# Patient Record
Sex: Female | Born: 1955 | Race: White | Hispanic: No | Marital: Single | Smoking: Never smoker
Health system: Southern US, Community
[De-identification: ages and names within clinical notes are randomized; demographics above are authoritative.]

## PROBLEM LIST (undated history)

## (undated) DIAGNOSIS — Z87442 Personal history of urinary calculi: Secondary | ICD-10-CM

## (undated) DIAGNOSIS — I10 Essential (primary) hypertension: Secondary | ICD-10-CM

## (undated) DIAGNOSIS — G459 Transient cerebral ischemic attack, unspecified: Secondary | ICD-10-CM

## (undated) DIAGNOSIS — G629 Polyneuropathy, unspecified: Secondary | ICD-10-CM

## (undated) DIAGNOSIS — IMO0002 Reserved for concepts with insufficient information to code with codable children: Secondary | ICD-10-CM

## (undated) DIAGNOSIS — Z85528 Personal history of other malignant neoplasm of kidney: Secondary | ICD-10-CM

## (undated) DIAGNOSIS — E785 Hyperlipidemia, unspecified: Secondary | ICD-10-CM

## (undated) DIAGNOSIS — Z973 Presence of spectacles and contact lenses: Secondary | ICD-10-CM

## (undated) DIAGNOSIS — D649 Anemia, unspecified: Secondary | ICD-10-CM

## (undated) DIAGNOSIS — Z8619 Personal history of other infectious and parasitic diseases: Secondary | ICD-10-CM

## (undated) DIAGNOSIS — I639 Cerebral infarction, unspecified: Secondary | ICD-10-CM

## (undated) DIAGNOSIS — Z8673 Personal history of transient ischemic attack (TIA), and cerebral infarction without residual deficits: Secondary | ICD-10-CM

## (undated) DIAGNOSIS — K760 Fatty (change of) liver, not elsewhere classified: Secondary | ICD-10-CM

## (undated) DIAGNOSIS — Z8739 Personal history of other diseases of the musculoskeletal system and connective tissue: Secondary | ICD-10-CM

## (undated) DIAGNOSIS — C649 Malignant neoplasm of unspecified kidney, except renal pelvis: Secondary | ICD-10-CM

## (undated) DIAGNOSIS — N2889 Other specified disorders of kidney and ureter: Secondary | ICD-10-CM

## (undated) DIAGNOSIS — N200 Calculus of kidney: Secondary | ICD-10-CM

## (undated) DIAGNOSIS — Q600 Renal agenesis, unilateral: Secondary | ICD-10-CM

## (undated) HISTORY — PX: TUBAL LIGATION: SHX77

---
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
## 1998-06-18 ENCOUNTER — Emergency Department (HOSPITAL_COMMUNITY): Admission: EM | Admit: 1998-06-18 | Discharge: 1998-06-18 | Payer: Self-pay | Admitting: Emergency Medicine

## 2007-10-09 ENCOUNTER — Ambulatory Visit: Payer: Self-pay | Admitting: *Deleted

## 2007-10-09 ENCOUNTER — Encounter (INDEPENDENT_AMBULATORY_CARE_PROVIDER_SITE_OTHER): Payer: Self-pay | Admitting: Otolaryngology

## 2007-10-09 ENCOUNTER — Inpatient Hospital Stay (HOSPITAL_COMMUNITY): Admission: EM | Admit: 2007-10-09 | Discharge: 2007-10-13 | Payer: Self-pay | Admitting: Emergency Medicine

## 2008-04-16 ENCOUNTER — Inpatient Hospital Stay (HOSPITAL_COMMUNITY): Admission: EM | Admit: 2008-04-16 | Discharge: 2008-04-18 | Payer: Self-pay | Admitting: Emergency Medicine

## 2008-04-17 ENCOUNTER — Encounter (INDEPENDENT_AMBULATORY_CARE_PROVIDER_SITE_OTHER): Payer: Self-pay | Admitting: Internal Medicine

## 2008-04-18 ENCOUNTER — Encounter (INDEPENDENT_AMBULATORY_CARE_PROVIDER_SITE_OTHER): Payer: Self-pay | Admitting: *Deleted

## 2008-04-18 ENCOUNTER — Ambulatory Visit: Payer: Self-pay | Admitting: Vascular Surgery
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
## 2011-03-18 NOTE — Discharge Summary (Signed)
[name], [name]               ACCOUNT NO.:  [id number]   MEDICAL RECORD NO.:  [id number]          PATIENT TYPE:  INP   LOCATION:  41                         FACILITY:  [facility]   PHYSICIAN:  Mobolaji B. Bakare, M.D.DATE OF BIRTH:  [birth year]   DATE OF ADMISSION:  04/16/2008  DATE OF DISCHARGE:  04/18/2008                               DISCHARGE SUMMARY   PRIMARY CARE PHYSICIAN:  Unassigned.   FINAL DIAGNOSES:  1. Transient ischemic attack.  2. Hypertension.  3. Hyperlipidemia.  4. Hyperhomocysteinemia.  5. Renal insufficiency, resolved with intravenous fluids.  6. Obesity.   PROCEDURES:  1. Head CT scan was negative for acute abnormality.  Chest x-ray      showed no acute cardiopulmonary disease.  MRI of the head showed no      acute infarct.  2. MRA of head showed mild intracranial atherosclerotic disease.  3. Carotid Dopplers showed no internal carotid artery stenosis.  A 2-D      echocardiogram showed normal left ventricular systolic function,      ejection fraction estimated to be 65%, no cardiac thrombus      identified.   BRIEF HISTORY:  Please refer to the admission H&P.  In brief, Ms.  Foore had a brief episode of numbness involving her left upper  extremity and left lower extremity without any associated weakness,  which lasted about 30 minutes prior to arrival in the emergency room.  Symptoms had resolved completely at the emergency room.  She did not  qualify for any thrombolytic.  She is a nonsmoker.  The patient does not  have a regular doctor.  She was noted on admission to be hypertensive  with significantly elevated blood pressure.  She was admitted to  telemetry and monitored.   HOSPITAL COURSE:  TIA.  She had comprehensive TIA workup including 2-D  echocardiogram, carotid Dopplers, MRI of the head and MRA of the neck.  There was no acute infarct identified on MRI.  There was no cardiac  thrombus.  Carotid Dopplers were normal.  Homocystine level  was  elevated.  She had hyperlipidemia with LDL of 159.  The patient was  started on Zocor 40 mg daily and also Foltx.  Blood pressure was  controlled with hydrochlorothiazide, Lopressor, and lisinopril.  At the  time of discharge, blood pressure was 142/77.  She will need to follow  up with a PCP in the outpatient setting.   She had renal insufficiency on admission with BUN and creatinine of  24/1.6 respectively.  She did well on IV fluid.  At the time of  discharge, BUN was 99 and creatinine of 1.18.   DISCHARGE MEDICATIONS:  1. Aspirin 325 mg p.o. daily.  2. Lopressor 25 mg b.i.d.  3. Lisinopril 10 mg daily.  4. Zocor 40 mg daily.  5. Foltx 1 tablet daily.   Follow up with Dr. Barbette Merino in 1 week.   DIET:  Low-sodium diet, low-cholesterol diet.      Mobolaji B. Maia Petties, M.D.  Electronically Signed     MBB/MEDQ  D:  04/18/2008  T:  04/19/2008  Job:  [id number]   cc:   Barbette Merino, M.D.
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
## 2011-07-31 LAB — CARDIAC PANEL(CRET KIN+CKTOT+MB+TROPI)
CK, MB: 1.2
Total CK: 61
Troponin I: 0.01

## 2011-07-31 LAB — URINE CULTURE: Colony Count: >=100000

## 2011-07-31 LAB — BASIC METABOLIC PANEL
BUN: 19
Calcium: 9.2
Creatinine, Ser: 1.18
GFR calc Af Amer: 58 — ABNORMAL LOW
GFR calc non Af Amer: 48 — ABNORMAL LOW

## 2011-07-31 LAB — URINALYSIS, ROUTINE W REFLEX MICROSCOPIC
Bilirubin Urine: NEGATIVE
Hgb urine dipstick: NEGATIVE
Ketones, ur: NEGATIVE
Protein, ur: NEGATIVE
Urobilinogen, UA: 0.2

## 2011-07-31 LAB — POCT I-STAT, CHEM 8
Calcium, Ion: 1.22
Chloride: 106
Creatinine, Ser: 1.6 — ABNORMAL HIGH
Glucose, Bld: 113 — ABNORMAL HIGH
HCT: 39
Hemoglobin: 13.3
Sodium: 141

## 2011-07-31 LAB — HEPATIC FUNCTION PANEL
Albumin: 3.4 — ABNORMAL LOW
Indirect Bilirubin: 0.5
Total Protein: 7

## 2011-07-31 LAB — POCT CARDIAC MARKERS: CKMB, poc: 1.3

## 2011-07-31 LAB — CBC
HCT: 37
Hemoglobin: 12.4
Platelets: 290
RBC: 4.75
RDW: 18.4 — ABNORMAL HIGH
WBC: 8.4

## 2011-07-31 LAB — DIFFERENTIAL
Basophils Absolute: 0
Eosinophils Absolute: 0.1
Eosinophils Relative: 2
Lymphocytes Relative: 27
Lymphs Abs: 2.3
Monocytes Absolute: 0.5
Monocytes Relative: 6
Neutro Abs: 5.4
Neutrophils Relative %: 64

## 2011-07-31 LAB — LIPID PANEL
Cholesterol: 249 — ABNORMAL HIGH
LDL Cholesterol: 159 — ABNORMAL HIGH
Triglycerides: 200 — ABNORMAL HIGH
VLDL: 40

## 2011-07-31 LAB — URINE MICROSCOPIC-ADD ON

## 2011-07-31 LAB — TSH: TSH: 4.816

## 2011-08-11 LAB — CBC
HCT: 23.9 — ABNORMAL LOW
HCT: 26.2 — ABNORMAL LOW
HCT: 36.1
Hemoglobin: 11.5 — ABNORMAL LOW
Hemoglobin: 7.9 — CL
Hemoglobin: 8.6 — ABNORMAL LOW
MCHC: 34.1
MCV: 100.1 — ABNORMAL HIGH
MCV: 74.5 — ABNORMAL LOW
MCV: 74.6 — ABNORMAL LOW
Platelets: 175
Platelets: 322
RBC: 3.36 — ABNORMAL LOW
RBC: 3.54 — ABNORMAL LOW
RBC: 4.84
RDW: 14.9
RDW: 18.8 — ABNORMAL HIGH
RDW: 19 — ABNORMAL HIGH
WBC: 10.2
WBC: 7.1

## 2011-08-11 LAB — I-STAT 8, (EC8 V) (CONVERTED LAB)
BUN: 23
Bicarbonate: 23.3
Glucose, Bld: 100 — ABNORMAL HIGH
Hemoglobin: 13.6
TCO2: 25
pH, Ven: 7.352 — ABNORMAL HIGH

## 2011-08-11 LAB — POCT I-STAT 3, ART BLOOD GAS (G3+)
Acid-Base Excess: 6 — ABNORMAL HIGH
Bicarbonate: 30.6 — ABNORMAL HIGH
O2 Saturation: 93
Operator id: 205501
Patient temperature: 99.1
Patient temperature: 99.5
TCO2: 24
pCO2 arterial: 37.2
pH, Arterial: 7.402 — ABNORMAL HIGH
pO2, Arterial: 66 — ABNORMAL LOW

## 2011-08-11 LAB — BASIC METABOLIC PANEL
BUN: 8
CO2: 27
Calcium: 7.4 — ABNORMAL LOW
Chloride: 109
GFR calc Af Amer: 55 — ABNORMAL LOW
GFR calc non Af Amer: 46 — ABNORMAL LOW
GFR calc non Af Amer: 53 — ABNORMAL LOW
Glucose, Bld: 109 — ABNORMAL HIGH
Glucose, Bld: 114 — ABNORMAL HIGH
Potassium: 3.9
Potassium: 4.2
Sodium: 137
Sodium: 139

## 2011-08-11 LAB — SAMPLE TO BLOOD BANK

## 2011-08-11 LAB — DIFFERENTIAL
Basophils Absolute: 0
Eosinophils Absolute: 0.1 — ABNORMAL LOW
Eosinophils Relative: 3
Lymphocytes Relative: 23
Monocytes Absolute: 0.4

## 2011-08-11 LAB — PROTIME-INR: Prothrombin Time: 12.6

## 2011-08-11 LAB — CROSSMATCH: Antibody Screen: NEGATIVE

## 2011-08-11 LAB — POCT I-STAT CREATININE: Creatinine, Ser: 1.4 — ABNORMAL HIGH

## 2015-05-14 ENCOUNTER — Ambulatory Visit (HOSPITAL_COMMUNITY)
Admission: RE | Admit: 2015-05-14 | Discharge: 2015-05-14 | Disposition: A | Discharge status: Home / Self Care | Payer: 59 | Source: Ambulatory Visit | Attending: Urology | Admitting: Urology

## 2015-05-14 ENCOUNTER — Other Ambulatory Visit: Payer: Self-pay | Admitting: Urology

## 2015-05-14 DIAGNOSIS — I517 Cardiomegaly: Secondary | ICD-10-CM | POA: Insufficient documentation

## 2015-05-14 DIAGNOSIS — N2889 Other specified disorders of kidney and ureter: Secondary | ICD-10-CM

## 2015-05-14 DIAGNOSIS — M47894 Other spondylosis, thoracic region: Secondary | ICD-10-CM | POA: Diagnosis not present

## 2015-05-28 ENCOUNTER — Other Ambulatory Visit: Payer: Self-pay | Admitting: Urology

## 2015-06-06 ENCOUNTER — Encounter (HOSPITAL_COMMUNITY)
Admission: RE | Admit: 2015-06-06 | Discharge: 2015-06-06 | Disposition: A | Discharge status: Home / Self Care | Payer: 59 | Source: Ambulatory Visit | Attending: Urology | Admitting: Urology

## 2015-06-06 ENCOUNTER — Encounter (HOSPITAL_COMMUNITY): Payer: Self-pay

## 2015-06-06 DIAGNOSIS — Z6837 Body mass index (BMI) 37.0-37.9, adult: Secondary | ICD-10-CM | POA: Diagnosis not present

## 2015-06-06 DIAGNOSIS — Z79899 Other long term (current) drug therapy: Secondary | ICD-10-CM | POA: Diagnosis not present

## 2015-06-06 DIAGNOSIS — E669 Obesity, unspecified: Secondary | ICD-10-CM | POA: Diagnosis not present

## 2015-06-06 DIAGNOSIS — Z7982 Long term (current) use of aspirin: Secondary | ICD-10-CM | POA: Diagnosis not present

## 2015-06-06 DIAGNOSIS — Z87442 Personal history of urinary calculi: Secondary | ICD-10-CM | POA: Diagnosis not present

## 2015-06-06 DIAGNOSIS — I129 Hypertensive chronic kidney disease with stage 1 through stage 4 chronic kidney disease, or unspecified chronic kidney disease: Secondary | ICD-10-CM | POA: Diagnosis not present

## 2015-06-06 DIAGNOSIS — N202 Calculus of kidney with calculus of ureter: Secondary | ICD-10-CM | POA: Diagnosis not present

## 2015-06-06 DIAGNOSIS — Z8673 Personal history of transient ischemic attack (TIA), and cerebral infarction without residual deficits: Secondary | ICD-10-CM | POA: Diagnosis not present

## 2015-06-06 DIAGNOSIS — N183 Chronic kidney disease, stage 3 (moderate): Secondary | ICD-10-CM | POA: Diagnosis not present

## 2015-06-06 DIAGNOSIS — N200 Calculus of kidney: Secondary | ICD-10-CM | POA: Diagnosis present

## 2015-06-06 DIAGNOSIS — E785 Hyperlipidemia, unspecified: Secondary | ICD-10-CM | POA: Diagnosis not present

## 2015-06-06 HISTORY — DX: Essential (primary) hypertension: I10

## 2015-06-06 HISTORY — DX: Cerebral infarction, unspecified: I63.9

## 2015-06-06 HISTORY — DX: Calculus of kidney: N20.0

## 2015-06-06 LAB — CBC
HCT: 38.9 % (ref 36.0–46.0)
HEMOGLOBIN: 13 g/dL (ref 12.0–15.0)
MCH: 30.9 pg (ref 26.0–34.0)
MCHC: 33.4 g/dL (ref 30.0–36.0)
MCV: 92.4 fL (ref 78.0–100.0)
PLATELETS: 218 10*3/uL (ref 150–400)
RBC: 4.21 MIL/uL (ref 3.87–5.11)
RDW: 14.8 % (ref 11.5–15.5)
WBC: 6.8 10*3/uL (ref 4.0–10.5)

## 2015-06-06 LAB — BASIC METABOLIC PANEL
ANION GAP: 6 (ref 5–15)
BUN: 25 mg/dL — ABNORMAL HIGH (ref 6–20)
CO2: 24 mmol/L (ref 22–32)
CREATININE: 1.81 mg/dL — AB (ref 0.44–1.00)
Calcium: 9.4 mg/dL (ref 8.9–10.3)
Chloride: 105 mmol/L (ref 101–111)
GFR calc Af Amer: 34 mL/min — ABNORMAL LOW (ref >60)
GFR calc non Af Amer: 29 mL/min — ABNORMAL LOW (ref >60)
GLUCOSE: 89 mg/dL (ref 65–99)
Potassium: 5 mmol/L (ref 3.5–5.1)
Sodium: 135 mmol/L (ref 135–145)

## 2015-06-06 NOTE — Patient Instructions (Addendum)
[name]  06/06/2015   Your procedure is scheduled on: Friday June 08, 2015  Report to [facility] Main  Entrance take [place]  elevators to 3rd floor to  [place] at 5:00 AM.  Call this number if you have problems the morning of surgery [phone]   Remember: ONLY 1 PERSON MAY GO WITH YOU TO SHORT STAY TO GET  READY MORNING OF [place].  Do not eat food or drink liquids :After Midnight.     Take these medicines the morning of surgery with A SIP OF WATER: Metoprolol (Lopressor)                               You may not have any metal on your body including hair pins and              piercings  Do not wear jewelry, make-up, lotions, powders or perfumes, deodorant             Do not wear nail polish.  Do not shave  48 hours prior to surgery.              Do not bring valuables to the hospital. [place].  Contacts, dentures or bridgework may not be worn into surgery.      Patients discharged the day of surgery will not be allowed to drive home.  Name and phone number of your driver:Roger Romilda Joy (son) [phone]  _____________________________________________________________________             [facility] - Preparing for Surgery Before surgery, you can play an important role.  Because skin is not sterile, your skin needs to be as free of germs as possible.  You can reduce the number of germs on your skin by washing with CHG (chlorahexidine gluconate) soap before surgery.  CHG is an antiseptic cleaner which kills germs and bonds with the skin to continue killing germs even after washing. Please DO NOT use if you have an allergy to CHG or antibacterial soaps.  If your skin becomes reddened/irritated stop using the CHG and inform your nurse when you arrive at Short Stay. Do not shave (including legs and underarms) for at least 48 hours prior to the first CHG shower.  You may shave  your face/neck. Please follow these instructions carefully:  1.  Shower with CHG Soap the night before surgery and the  morning of Surgery.  2.  If you choose to wash your hair, wash your hair first as usual with your  normal  shampoo.  3.  After you shampoo, rinse your hair and body thoroughly to remove the  shampoo.                           4.  Use CHG as you would any other liquid soap.  You can apply chg directly  to the skin and wash                       Gently with a scrungie or clean washcloth.  5.  Apply the CHG Soap to your body ONLY FROM THE NECK DOWN.  Do not use on face/ open                           Wound or open sores. Avoid contact with eyes, ears mouth and genitals (private parts).                       Wash face,  Genitals (private parts) with your normal soap.             6.  Wash thoroughly, paying special attention to the area where your surgery  will be performed.  7.  Thoroughly rinse your body with warm water from the neck down.  8.  DO NOT shower/wash with your normal soap after using and rinsing off  the CHG Soap.                9.  Pat yourself dry with a clean towel.            10.  Wear clean pajamas.            11.  Place clean sheets on your bed the night of your first shower and do not  sleep with pets. Day of Surgery : Do not apply any lotions/deodorants the morning of surgery.  Please wear clean clothes to the hospital/surgery center.  FAILURE TO FOLLOW THESE INSTRUCTIONS MAY RESULT IN THE CANCELLATION OF YOUR SURGERY PATIENT SIGNATURE_________________________________  NURSE SIGNATURE__________________________________  ________________________________________________________________________

## 2015-06-06 NOTE — Progress Notes (Signed)
ECHO in epic 04/17/2008  CXR epic 05/14/2015

## 2015-06-06 NOTE — Progress Notes (Signed)
Your patient has screened at an elevated risk for Obstructive Sleep Apnea using the Stop-Bang Tool during a pre-surgical vist. A score of 4 or greater is an elevated risk. Score of 4. 

## 2015-06-06 NOTE — Progress Notes (Signed)
BMP results in epic per PAT visit 06/06/2015 sent to Dr Hubbard Robinson Manny/[facility].

## 2015-06-08 ENCOUNTER — Encounter (HOSPITAL_COMMUNITY): Payer: Self-pay | Admitting: Certified Registered Nurse Anesthetist

## 2015-06-08 ENCOUNTER — Ambulatory Visit (HOSPITAL_COMMUNITY): Payer: 59 | Admitting: Anesthesiology

## 2015-06-08 ENCOUNTER — Ambulatory Visit (HOSPITAL_COMMUNITY)
Admission: RE | Admit: 2015-06-08 | Discharge: 2015-06-08 | Disposition: A | Discharge status: Home / Self Care | Payer: 59 | Source: Ambulatory Visit | Attending: Urology | Admitting: Urology

## 2015-06-08 ENCOUNTER — Encounter (HOSPITAL_COMMUNITY)
Admission: RE | Disposition: A | Discharge status: Home / Self Care | Payer: Self-pay | Source: Ambulatory Visit | Attending: Urology

## 2015-06-08 ENCOUNTER — Ambulatory Visit (HOSPITAL_COMMUNITY): Payer: 59

## 2015-06-08 DIAGNOSIS — N202 Calculus of kidney with calculus of ureter: Secondary | ICD-10-CM | POA: Insufficient documentation

## 2015-06-08 DIAGNOSIS — R52 Pain, unspecified: Secondary | ICD-10-CM

## 2015-06-08 DIAGNOSIS — Z8673 Personal history of transient ischemic attack (TIA), and cerebral infarction without residual deficits: Secondary | ICD-10-CM | POA: Insufficient documentation

## 2015-06-08 DIAGNOSIS — N183 Chronic kidney disease, stage 3 (moderate): Secondary | ICD-10-CM | POA: Insufficient documentation

## 2015-06-08 DIAGNOSIS — E785 Hyperlipidemia, unspecified: Secondary | ICD-10-CM | POA: Insufficient documentation

## 2015-06-08 DIAGNOSIS — E669 Obesity, unspecified: Secondary | ICD-10-CM | POA: Insufficient documentation

## 2015-06-08 DIAGNOSIS — Z7982 Long term (current) use of aspirin: Secondary | ICD-10-CM | POA: Insufficient documentation

## 2015-06-08 DIAGNOSIS — I129 Hypertensive chronic kidney disease with stage 1 through stage 4 chronic kidney disease, or unspecified chronic kidney disease: Secondary | ICD-10-CM | POA: Insufficient documentation

## 2015-06-08 DIAGNOSIS — Z6837 Body mass index (BMI) 37.0-37.9, adult: Secondary | ICD-10-CM | POA: Insufficient documentation

## 2015-06-08 DIAGNOSIS — Z79899 Other long term (current) drug therapy: Secondary | ICD-10-CM | POA: Insufficient documentation

## 2015-06-08 DIAGNOSIS — Z87442 Personal history of urinary calculi: Secondary | ICD-10-CM | POA: Insufficient documentation

## 2015-06-08 HISTORY — PX: CYSTOSCOPY WITH RETROGRADE PYELOGRAM, URETEROSCOPY AND STENT PLACEMENT: SHX5789

## 2015-06-08 HISTORY — PX: HOLMIUM LASER APPLICATION: SHX5852

## 2015-06-08 SURGERY — CYSTOURETEROSCOPY, WITH RETROGRADE PYELOGRAM AND STENT INSERTION
Anesthesia: General | Site: Ureter | Laterality: Right

## 2015-06-08 MED ORDER — FENTANYL CITRATE (PF) 100 MCG/2ML IJ SOLN
25.0000 ug | INTRAMUSCULAR | Status: DC | PRN
  Administered 2015-06-08: 50 ug via INTRAVENOUS

## 2015-06-08 MED ORDER — MEPERIDINE HCL 50 MG/ML IJ SOLN: 6.2500 mg | INTRAMUSCULAR | Status: DC | PRN

## 2015-06-08 MED ORDER — SODIUM CHLORIDE 0.9 % IR SOLN
Status: DC | PRN
  Administered 2015-06-08: 6000 mL

## 2015-06-08 MED ORDER — ONDANSETRON HCL 4 MG/2ML IJ SOLN
INTRAMUSCULAR | Status: AC
  Filled 2015-06-08: qty 2

## 2015-06-08 MED ORDER — OXYCODONE-ACETAMINOPHEN 5-325 MG PO TABS
1.0000 | ORAL_TABLET | Freq: Once | ORAL | Status: AC
  Administered 2015-06-08: 1 via ORAL
  Filled 2015-06-08: qty 1

## 2015-06-08 MED ORDER — LACTATED RINGERS IV SOLN: INTRAVENOUS | Status: DC

## 2015-06-08 MED ORDER — MIDAZOLAM HCL 5 MG/5ML IJ SOLN
INTRAMUSCULAR | Status: DC | PRN
  Administered 2015-06-08 (×2): 1 mg via INTRAVENOUS

## 2015-06-08 MED ORDER — FENTANYL CITRATE (PF) 100 MCG/2ML IJ SOLN
INTRAMUSCULAR | Status: DC | PRN
  Administered 2015-06-08 (×3): 12.5 ug via INTRAVENOUS
  Administered 2015-06-08: 25 ug via INTRAVENOUS
  Administered 2015-06-08: 12.5 ug via INTRAVENOUS
  Administered 2015-06-08: 25 ug via INTRAVENOUS

## 2015-06-08 MED ORDER — LACTATED RINGERS IV SOLN
INTRAVENOUS | Status: DC | PRN
  Administered 2015-06-08 (×2): via INTRAVENOUS

## 2015-06-08 MED ORDER — ONDANSETRON HCL 4 MG/2ML IJ SOLN
INTRAMUSCULAR | Status: DC | PRN
  Administered 2015-06-08: 4 mg via INTRAVENOUS

## 2015-06-08 MED ORDER — PROPOFOL 10 MG/ML IV BOLUS
INTRAVENOUS | Status: DC | PRN
  Administered 2015-06-08: 150 mg via INTRAVENOUS

## 2015-06-08 MED ORDER — OXYCODONE-ACETAMINOPHEN 5-325 MG PO TABS: 1.0000 | ORAL_TABLET | ORAL | Status: DC | PRN

## 2015-06-08 MED ORDER — FENTANYL CITRATE (PF) 100 MCG/2ML IJ SOLN
INTRAMUSCULAR | Status: AC
  Filled 2015-06-08: qty 2

## 2015-06-08 MED ORDER — IOHEXOL 300 MG/ML  SOLN
INTRAMUSCULAR | Status: DC | PRN
  Administered 2015-06-08: 14 mL via INTRAVENOUS

## 2015-06-08 MED ORDER — FENTANYL CITRATE (PF) 100 MCG/2ML IJ SOLN
INTRAMUSCULAR | Status: AC
  Filled 2015-06-08: qty 4

## 2015-06-08 MED ORDER — EPHEDRINE SULFATE 50 MG/ML IJ SOLN
INTRAMUSCULAR | Status: AC
  Filled 2015-06-08: qty 1

## 2015-06-08 MED ORDER — SENNOSIDES-DOCUSATE SODIUM 8.6-50 MG PO TABS: 1.0000 | ORAL_TABLET | Freq: Two times a day (BID) | ORAL | Status: DC

## 2015-06-08 MED ORDER — MIDAZOLAM HCL 2 MG/2ML IJ SOLN
INTRAMUSCULAR | Status: AC
  Filled 2015-06-08: qty 4

## 2015-06-08 MED ORDER — DEXTROSE 5 % IV SOLN
2.0000 g | INTRAVENOUS | Status: AC
  Administered 2015-06-08: 2 g via INTRAVENOUS

## 2015-06-08 MED ORDER — PROMETHAZINE HCL 25 MG/ML IJ SOLN
6.2500 mg | INTRAMUSCULAR | Status: DC | PRN
Start: 2015-06-08 — End: 2015-06-08

## 2015-06-08 MED ORDER — PROPOFOL 10 MG/ML IV BOLUS
INTRAVENOUS | Status: AC
  Filled 2015-06-08: qty 20

## 2015-06-08 MED ORDER — EPHEDRINE SULFATE 50 MG/ML IJ SOLN
INTRAMUSCULAR | Status: DC | PRN
  Administered 2015-06-08 (×2): 5 mg via INTRAVENOUS

## 2015-06-08 MED ORDER — DEXTROSE 5 % IV SOLN
INTRAVENOUS | Status: AC
  Filled 2015-06-08: qty 2

## 2015-06-08 SURGICAL SUPPLY — 26 items
BASKET LASER NITINOL 1.9FR (BASKET) ×2 IMPLANT
BASKET STNLS GEMINI 4WIRE 3FR (BASKET) IMPLANT
BASKET ZERO TIP NITINOL 2.4FR (BASKET) IMPLANT
BSKT STON RTRVL 120 1.9FR (BASKET) ×2
BSKT STON RTRVL GEM 120X11 3FR (BASKET)
BSKT STON RTRVL ZERO TP 2.4FR (BASKET)
CATH INTERMIT  6FR 70CM (CATHETERS) ×4 IMPLANT
CLOTH BEACON ORANGE TIMEOUT ST (SAFETY) ×4 IMPLANT
ELECT REM PT RETURN 9FT ADLT (ELECTROSURGICAL)
ELECTRODE REM PT RTRN 9FT ADLT (ELECTROSURGICAL) IMPLANT
FIBER LASER FLEXIVA 1000 (UROLOGICAL SUPPLIES) IMPLANT
FIBER LASER FLEXIVA 200 (UROLOGICAL SUPPLIES) ×2 IMPLANT
FIBER LASER FLEXIVA 365 (UROLOGICAL SUPPLIES) IMPLANT
FIBER LASER FLEXIVA 550 (UROLOGICAL SUPPLIES) IMPLANT
FIBER LASER TRAC TIP (UROLOGICAL SUPPLIES) ×2 IMPLANT
GLOVE BIOGEL M STRL SZ7.5 (GLOVE) ×4 IMPLANT
GOWN STRL REUS W/TWL XL LVL3 (GOWN DISPOSABLE) ×8 IMPLANT
GUIDEWIRE ANG ZIPWIRE 038X150 (WIRE) ×8 IMPLANT
GUIDEWIRE STR DUAL SENSOR (WIRE) ×4 IMPLANT
IV NS IRRIG 3000ML ARTHROMATIC (IV SOLUTION) ×4 IMPLANT
PACK CYSTO (CUSTOM PROCEDURE TRAY) ×4 IMPLANT
SHEATH ACCESS URETERAL 24CM (SHEATH) ×2 IMPLANT
STENT POLARIS 5FRX22 (STENTS) ×4 IMPLANT
SYRINGE 10CC LL (SYRINGE) IMPLANT
SYRINGE IRR TOOMEY STRL 70CC (SYRINGE) IMPLANT
TUBE FEEDING 8FR 16IN STR KANG (MISCELLANEOUS) ×4 IMPLANT

## 2015-06-08 NOTE — Transfer of Care (Signed)
Immediate Anesthesia Transfer of Care Note  Patient: [name]  Procedure(s) Performed: Procedure(s): CYSTOSCOPY WITH RETROGRADE PYELOGRAM, URETEROSCOPY, STONE EXTRACTION AND STENT PLACEMENT ON THE RIGHT, DIAGNOSTIC URETEROSCOPY AND STENT PLACEMENT ON THE LEFT (Bilateral) HOLMIUM LASER APPLICATION (Right)  Patient Location: PACU  Anesthesia Type:General  Level of Consciousness: Patient easily awoken, sedated, comfortable, cooperative, following commands, responds to stimulation.   Airway & Oxygen Therapy: Patient spontaneously breathing, ventilating well, oxygen via simple oxygen mask.  Post-op Assessment: Report given to PACU RN, vital signs reviewed and stable, moving all extremities.   Post vital signs: Reviewed and stable.  Complications: No apparent anesthesia complications

## 2015-06-08 NOTE — Discharge Instructions (Signed)
1 - You may have urinary urgency (bladder spasms) and bloody urine on / off with stent in place. This is normal. ° °2 - Call MD or go to ER for fever >102, severe pain / nausea / vomiting not relieved by medications, or acute change in medical status ° °

## 2015-06-08 NOTE — Anesthesia Preprocedure Evaluation (Addendum)
Anesthesia Evaluation  Patient identified by MRN, date of birth, ID band Patient awake    Reviewed: Allergy & Precautions, NPO status , Patient's Chart, lab work & pertinent test results  Airway Mallampati: II  TM Distance: >3 FB Neck ROM: Full    Dental no notable dental hx. (+) Missing   Pulmonary neg pulmonary ROS,  breath sounds clear to auscultation  Pulmonary exam normal       Cardiovascular hypertension, Pt. on medications and Pt. on home beta blockers Normal cardiovascular examRhythm:Regular Rate:Normal     Neuro/Psych TIAnegative psych ROS   GI/Hepatic negative GI ROS, Neg liver ROS,   Endo/Other  negative endocrine ROS  Renal/GU negative Renal ROS  negative genitourinary   Musculoskeletal negative musculoskeletal ROS (+)   Abdominal   Peds negative pediatric ROS (+)  Hematology negative hematology ROS (+)   Anesthesia Other Findings   Reproductive/Obstetrics negative OB ROS                            Anesthesia Physical Anesthesia Plan  ASA: III  Anesthesia Plan: General   Post-op Pain Management:    Induction: Intravenous  Airway Management Planned: LMA  Additional Equipment:   Intra-op Plan:   Post-operative Plan: Extubation in OR  Informed Consent: I have reviewed the patients History and Physical, chart, labs and discussed the procedure including the risks, benefits and alternatives for the proposed anesthesia with the patient or authorized representative who has indicated his/her understanding and acceptance.   Dental advisory given  Plan Discussed with: CRNA  Anesthesia Plan Comments:         Anesthesia Quick Evaluation

## 2015-06-08 NOTE — Op Note (Signed)
NAMERUPAL, WETHERELL               ACCOUNT NO.:  [id number]  MEDICAL RECORD NO.:  [id number]  LOCATION:  [facility]                         FACILITY:  [facility]  PHYSICIAN:  Alexis Frock, MD     DATE OF BIRTH:  Dec 02, 1955  DATE OF PROCEDURE:  06/08/2015 DATE OF DISCHARGE:  06/08/2015                              OPERATIVE REPORT   DIAGNOSES: 1. Right renal and ureteral stones. 2. Questionable left renal stones. 3. Atrophic left kidney. 4. Renal insufficiency.  PROCEDURE: 1. Cystoscopy with bilateral pyelogram interpretation. 2. Right ureteroscopy with laser lithotripsy, insertion of right     ureteral stent. 3. Left retrograde pyelogram interpretation. 4. Left diagnostic ureteroscopy. 5. Insertion of left ureteral stent.  ESTIMATED BLOOD LOSS:  Nil.  COMPLICATIONS:  None.  SPECIMEN:  Right renal and ureteral stones for compositional analysis.  FINDINGS: 1. A large right distal ureteral stone with mild hydroureteronephrosis     above this. 2. Multifocal right renal stone x2. 3. No evidence of intraluminal left-sided renal stones. 4. Placement of bilateral 5 x 22 Polaris stents, no tether.  INDICATION:  [name] is a 59 year old lady who was found to have multifocal nephrolithiasis, a large left renal mass, an atrophic left kidney and stones, included a right distal ureteral stone, multifocal right renal stones and some very small left-sided renal stones.  Her left renal mass is quite concerning for primary malignancy as it is localized and she will be undergoing left partial versus radical nephrectomy in the near future in order to obtain stone-free status of her future solitary kidney.  It was felt that ureteroscopy was warranted to render her right side stone free as well as verify intrarenal anatomy on the left to help guide toward therapy.  Informed consent was obtained and placed in the medical record.  PROCEDURE IN DETAIL:  The patient being [name]  verified. Procedure being cysto, bilateral ureteroscopy and stent placement was confirmed.  Procedure was carried out.  Time-out was performed. Intravenous antibiotics were administered.  General LMA anesthesia was introduced.  The patient was placed into a low lithotomy position. Sterile field was created by prepping and draping the patient's vagina, introitus, and proximal thighs using iodine x3.  Next, cystourethroscopy was performed using a 22-French rigid cystoscope with 30-degree offset lens.  Inspection of the bladder revealed no diverticula, calcifications or papillary lesions.  The right ureteral orifice was cannulated with 6- French end-hole catheter and right retrograde was obtained.  Right retrograde pyelogram demonstrated a single right ureter with single system right kidney.  There was a large filling defect in the distal ureter consistent with known stone.  There was mild hydroureteronephrosis above this.  A 0.038 ZIPwire was advanced at the level of upper pole and set aside as a safety wire.  An 8-French feeding tube placed in the urinary bladder for pressure release.  Next, semi- rigid ureteroscopy was performed from the distal right ureter alongside a separate Sensor working wire.  As expected, there was a large ovoid distal ureteral stone that was encountered.  This appeared to be much too large for simple basketing.  As such, holmium laser energy was applied to the stone using settings of  0.2 joules and 30 Hz, fragmenting the stone into approximately 6 smaller pieces.  These were then grasped on their long axis with an Escape basket and brought out and set aside for compositional analysis.  A semi-rigid ureteroscopy of the remainder of length of the right ureter revealed no additional calcifications.  No mucosal abnormalities.  As the goal was to render the right side completely stone free, the semi-rigid ureteroscope was exchanged over a Sensor working wire for a  12/14, 24 cm ureteral access sheath at the level of proximal ureter using fluoroscopic guidance.  Next, flexible digital ureteroscopy was performed on the right using a dual channel flexible ureteroscope.  Inspection of all calices x2 revealed 2 foci of stone.  As expected, these appeared to be amenable to basketing.  They were grasped with an Escape basket and removed in their entirety, set aside for compositional analysis.  All these maneuvers revealed the resolution of all stone fragments, larger than one-third millimeter on the right.  The access sheath was removed under continuous ureteroscopic vision.  No mucosal abnormalities were found.  Attention was then directed at the left side.  The left ureter was inspected throughout its length with a semi-rigid ureteroscope over a ZIP safety wire.  No mucosal abnormalities were found.  A separate Sensor working wire was placed on the left side and the access sheath was placed using fluoroscopic guidance to the level of the proximal ureter.  Next, flexible digital ureteroscopy was performed on the left side. Inspection of each calix x2 revealed no intraluminal stones whatsoever. It was felt that prior calcifications were only papillary tip on the left.  The access sheath was removed under continuous vision.  No mucosal abnormalities were found.  Given the patient's recent renal insufficiency and obstruction of her more dominant right kidney, it was felt that interval stenting was warranted.  As such, new 5 x 22 Polaris type stents were placed bilaterally using cystoscopic and fluoroscopic guidance.  Good deployment was noted, proximal in renal pelvis and distal in urinary bladder.  Bladder was emptied per cystoscope. Procedure was then terminated.  The patient tolerated the procedure well.  There were no immediate periprocedural complications.  The patient was taken to the postanesthesia care unit in stable condition.           ______________________________ Alexis Frock, MD     TM/MEDQ  D:  06/08/2015  T:  06/08/2015  Job:  [id number]

## 2015-06-08 NOTE — Anesthesia Postprocedure Evaluation (Signed)
  Anesthesia Post-op Note  Patient: [name]  Procedure(s) Performed: Procedure(s) (LRB): CYSTOSCOPY WITH RETROGRADE PYELOGRAM, URETEROSCOPY, STONE EXTRACTION AND STENT PLACEMENT ON THE RIGHT, DIAGNOSTIC URETEROSCOPY AND STENT PLACEMENT ON THE LEFT (Bilateral) HOLMIUM LASER APPLICATION (Right)  Patient Location: PACU  Anesthesia Type: General  Level of Consciousness: awake and alert   Airway and Oxygen Therapy: Patient Spontanous Breathing  Post-op Pain: mild  Post-op Assessment: Post-op Vital signs reviewed, Patient's Cardiovascular Status Stable, Respiratory Function Stable, Patent Airway and No signs of Nausea or vomiting  Last Vitals:  Filed Vitals:   06/08/15 1026  BP: 134/70  Pulse: 58  Temp:   Resp: 16    Post-op Vital Signs: stable   Complications: No apparent anesthesia complications

## 2015-06-08 NOTE — H&P (Signed)
[name] is an 59 y.o. female.    Chief Complaint: Pre-op Bilateral Ureteroscopic Stone Manipulation  HPI:     1 - Left Renal Mass - 5cm left upper pole mass by non-con CT 2016. Trauma CT 2008 with 2cm enhancing mass in similar location worrisome for RCC at that point. Contrast CT 05/2015 confirms approx 5cm left upper renal mass, about 60% endophytic and abuts hilar fat / 1 artery / 1 vein renovascular anatomy, CXR w/o chest masses.  2 - Nephrolithiasis - Remote history of stone passed medially years ago. CT 05/2015 with large rt distal ureteral stone (71m), Rt renal stone (490mx2) w/o hydro and punctate left lower pole stones.   3 - Stage 3 Renal Insufficiency - Cr 1.47 / GFR 40 by serum labs 05/2015. Rt>Lt renal atrophy by axial imaging.   PMH sig for HTN, HLD, Obesity. NO CV disease. NO chest / abd surgeries. Her PCP is [name] with [facility] " [name] is seen to proceed with bilateral ureteroscopic stone manipulation with goal of decompresison of her right (funcitonally dominant and likley future solitary) kidney and achieve stone free status prior to mass for left renal mass. No interval fevers. Most recent UCX with non-clonal non-specific growth for which she has been on Bactrim to decrease colony counts.   Past Medical History  Diagnosis Date  . Hypertension   . Stroke     pt states had "mini stroke" in 2010  . Numbness and tingling     hands bilat comes and goes   . Kidney stones     bilat   . Urinary tract infection     hx of   . Urinary frequency   . Measles     hx of in childhood     Past Surgical History  Procedure Laterality Date  . Tubal ligation    . Cesarean section      times 2    No family history on file. Social History:  reports that she has never smoked. She has never used smokeless tobacco. She reports that she does not drink alcohol or use illicit drugs.  Allergies: No Known Allergies  Medications Prior to Admission   Medication Sig Dispense Refill  . aspirin EC 81 MG tablet Take 81 mg by mouth daily.    . Cholecalciferol (VITAMIN D3) 5000 UNITS CAPS Take 5,000 Units by mouth daily.    . Marland Kitchenosartan (COZAAR) 50 MG tablet Take 50 mg by mouth every morning.     . lovastatin (MEVACOR) 20 MG tablet Take 20 mg by mouth every morning.     . metoprolol tartrate (LOPRESSOR) 25 MG tablet Take 25 mg by mouth 2 (two) times daily.      Results for orders placed or performed during the hospital encounter of 06/06/15 (from the past 48 hour(s))  CBC     Status: None   Collection Time: 06/06/15  9:05 AM  Result Value Ref Range   WBC 6.8 4.0 - 10.5 K/uL   RBC 4.21 3.87 - 5.11 MIL/uL   Hemoglobin 13.0 12.0 - 15.0 g/dL   HCT 38.9 36.0 - 46.0 %   MCV 92.4 78.0 - 100.0 fL   MCH 30.9 26.0 - 34.0 pg   MCHC 33.4 30.0 - 36.0 g/dL   RDW 14.8 11.5 - 15.5 %   Platelets 218 150 - 400 K/uL  Basic metabolic panel     Status: Abnormal   Collection Time: 06/06/15  9:05 AM  Result Value Ref Range   Sodium 135 135 - 145 mmol/L   Potassium 5.0 3.5 - 5.1 mmol/L   Chloride 105 101 - 111 mmol/L   CO2 24 22 - 32 mmol/L   Glucose, Bld 89 65 - 99 mg/dL   BUN 25 (H) 6 - 20 mg/dL   Creatinine, Ser 1.81 (H) 0.44 - 1.00 mg/dL   Calcium 9.4 8.9 - 10.3 mg/dL   GFR calc non Af Amer 29 (L) >60 mL/min   GFR calc Af Amer 34 (L) >60 mL/min    Comment: (NOTE) The eGFR has been calculated using the CKD EPI equation. This calculation has not been validated in all clinical situations. eGFR's persistently <60 mL/min signify possible Chronic Kidney Disease.    Anion gap 6 5 - 15   No results found.  Review of Systems  Constitutional: Negative.   HENT: Negative.   Eyes: Negative.   Respiratory: Negative.   Cardiovascular: Negative.   Gastrointestinal: Negative.   Genitourinary: Negative.   Musculoskeletal: Negative.   Skin: Negative.   Neurological: Negative.   Endo/Heme/Allergies: Negative.   Psychiatric/Behavioral: Negative.      Blood pressure 171/93, pulse 55, temperature 97.4 F (36.3 C), temperature source Oral, resp. rate 18, height _0  (1.549 m), weight 90.436 kg (199 lb 6 oz), SpO2 98 %. Physical Exam  Constitutional: She appears well-developed.  HENT:  Head: Normocephalic.  Eyes: Pupils are equal, round, and reactive to light.  Neck: Normal range of motion.  Cardiovascular: Normal rate.   Respiratory: Effort normal.  GI: Soft.  Genitourinary:  No CVAT at present  Musculoskeletal: Normal range of motion.  Neurological: She is alert.  Skin: Skin is warm.  Psychiatric: She has a normal mood and affect. Her behavior is normal. Judgment and thought content normal.     Assessment/Plan          1 - Left Renal Mass - certianly worrisome for renal cell carcinoma. Relatively large / deep mass in lady with some significant renal compromise and nephrolithiais. Rec Rt ureteroscopy / laser to get rid of all right sided stones (as may become solitary Rt kidney) next avail then likely left robotic partial v. radical nephrectomy.   2 - Nephrolithiasis - Significant rt ureteral and rt renal stones and small left sided renal stones. Rec goal of stone free before tumor surgery to maximize renal function prior and lessen chances of blockaged of future solitary kidney with bilateral ureteroscopy.  We rediscussed ureteroscopic stone manipulation with basketing and laser-lithotripsy in detail.  We rediscussed risks including bleeding, infection, damage to kidney / ureter  bladder, rarely loss of kidney. We rediscussed anesthetic risks and rare but serious surgical complications including DVT, PE, MI, and mortality. We specifically readdressed that in 5-10% of cases a staged approach is required with stenting followed by re-attempt ureteroscopy if anatomy unfavorable.   The patient voiced understanding and wishes to proceed today as planned.   3 - Stage 3 Renal Insufficiency - likely combination some stone obstruciton  and medical renal disease. She has very real risk of progression to ESRD in likght of poor baseline funciton and need for left nephron removal from cancer.    [name] 06/08/2015, 6:16 AM

## 2015-06-08 NOTE — Addendum Note (Signed)
Addendum  created 06/08/15 1431 by Deliah Boston, CRNA   Modules edited: Anesthesia Attestations

## 2015-06-08 NOTE — Brief Op Note (Signed)
06/08/2015  8:54 AM  PATIENT:  [name]  59 y.o. female  PRE-OPERATIVE DIAGNOSIS:  RIGHT GREATER THAN LEFT KIDNEY STONES  POST-OPERATIVE DIAGNOSIS:  RIGHT GREATER THAN LEFT KIDNEY STONES  PROCEDURE:  Procedure(s): CYSTOSCOPY WITH RETROGRADE PYELOGRAM, URETEROSCOPY AND STENT PLACEMENT (Bilateral) HOLMIUM LASER APPLICATION (Bilateral)  SURGEON:  Surgeon(s) and Role:    * Alexis Frock, MD - Primary  PHYSICIAN ASSISTANT:   ASSISTANTS: none   ANESTHESIA:   general  EBL:  Total I/O In: 1000 [I.V.:1000] Out: -   BLOOD ADMINISTERED:none  DRAINS: none   LOCAL MEDICATIONS USED:  NONE  SPECIMEN:  Source of Specimen:  Rt renal and ureteral stones  DISPOSITION OF SPECIMEN:  [facility] for compositional analysis  COUNTS:  YES  TOURNIQUET:  * No tourniquets in log *  DICTATION: .Other Dictation: Dictation Number [id number]  PLAN OF CARE: Discharge to home after PACU  PATIENT DISPOSITION:  PACU - hemodynamically stable.   Delay start of Pharmacological VTE agent (>24hrs) due to surgical blood loss or risk of bleeding: yes

## 2015-06-08 NOTE — Anesthesia Procedure Notes (Signed)
Procedure Name: LMA Insertion Date/Time: 06/08/2015 7:35 AM Performed by: Deliah Boston Pre-anesthesia Checklist: Patient identified, Emergency Drugs available, Suction available and Patient being monitored Patient Re-evaluated:Patient Re-evaluated prior to inductionOxygen Delivery Method: Circle system utilized Preoxygenation: Pre-oxygenation with 100% oxygen Intubation Type: IV induction Ventilation: Mask ventilation without difficulty LMA: LMA inserted LMA Size: 4.0 Number of attempts: 1 Placement Confirmation: positive ETCO2 and breath sounds checked- equal and bilateral Tube secured with: Tape Dental Injury: Teeth and Oropharynx as per pre-operative assessment

## 2015-06-11 ENCOUNTER — Encounter (HOSPITAL_COMMUNITY): Payer: Self-pay | Admitting: Urology

## 2015-06-21 ENCOUNTER — Other Ambulatory Visit: Payer: Self-pay | Admitting: Urology

## 2015-07-13 NOTE — Patient Instructions (Addendum)
YOUR PROCEDURE IS SCHEDULED ON :  07/18/15  REPORT TO [facility] MAIN ENTRANCE FOLLOW SIGNS TO EAST ELEVATOR - GO TO 3rd FLOOR CHECK IN AT 3 EAST NURSES STATION (SHORT STAY) AT:  6:30 AM  CALL THIS NUMBER IF YOU HAVE PROBLEMS THE MORNING OF SURGERY [phone]  REMEMBER:ONLY 1 PER PERSON MAY GO TO SHORT STAY WITH YOU TO GET READY THE MORNING OF YOUR SURGERY  DO NOT EAT FOOD OR DRINK LIQUIDS AFTER MIDNIGHT  TAKE THESE MEDICINES THE MORNING OF SURGERY: LOVASTATIN / METOPROLOL  YOU MAY NOT HAVE ANY METAL ON YOUR BODY INCLUDING HAIR PINS AND PIERCING'S. DO NOT WEAR JEWELRY, MAKEUP, LOTIONS, POWDERS OR PERFUMES. DO NOT WEAR NAIL POLISH. DO NOT SHAVE 48 HRS PRIOR TO SURGERY. MEN MAY SHAVE FACE AND NECK.  DO NOT [place]. [facility] IS NOT RESPONSIBLE FOR VALUABLES.  CONTACTS, DENTURES OR PARTIALS MAY NOT BE WORN TO SURGERY. LEAVE SUITCASE IN CAR. CAN BE BROUGHT TO ROOM AFTER SURGERY.  PATIENTS DISCHARGED THE DAY OF SURGERY WILL NOT BE ALLOWED TO DRIVE HOME.  PLEASE READ OVER THE FOLLOWING INSTRUCTION SHEETS _________________________________________________________________________________                                          [place] - PREPARING FOR SURGERY  Before surgery, you can play an important role.  Because skin is not sterile, your skin needs to be as free of germs as possible.  You can reduce the number of germs on your skin by washing with CHG (chlorahexidine gluconate) soap before surgery.  CHG is an antiseptic cleaner which kills germs and bonds with the skin to continue killing germs even after washing. Please DO NOT use if you have an allergy to CHG or antibacterial soaps.  If your skin becomes reddened/irritated stop using the CHG and inform your nurse when you arrive at Short Stay. Do not shave (including legs and underarms) for at least 48 hours prior to the first CHG shower.  You may shave your face. Please follow these  instructions carefully:   1.  Shower with CHG Soap the night before surgery and the  morning of Surgery.   2.  If you choose to wash your hair, wash your hair first as usual with your  normal  Shampoo.   3.  After you shampoo, rinse your hair and body thoroughly to remove the  shampoo.                                         4.  Use CHG as you would any other liquid soap.  You can apply chg directly  to the skin and wash . Gently wash with scrungie or clean wascloth    5.  Apply the CHG Soap to your body ONLY FROM THE NECK DOWN.   Do not use on open                           Wound or open sores. Avoid contact with eyes, ears mouth and genitals (private parts).                        Genitals (private parts) with your normal soap.  6.  Wash thoroughly, paying special attention to the area where your surgery  will be performed.   7.  Thoroughly rinse your body with warm water from the neck down.   8.  DO NOT shower/wash with your normal soap after using and rinsing off  the CHG Soap .                9.  Pat yourself dry with a clean towel.             10.  Wear clean night clothes to bed after shower             11.  Place clean sheets on your bed the night of your first shower and do not  sleep with pets.  Day of Surgery : Do not apply any lotions/deodorants the morning of surgery.  Please wear clean clothes to the hospital/surgery center.  FAILURE TO FOLLOW THESE INSTRUCTIONS MAY RESULT IN THE CANCELLATION OF YOUR SURGERY    PATIENT SIGNATURE_________________________________  ______________________________________________________________________

## 2015-07-16 ENCOUNTER — Encounter (HOSPITAL_COMMUNITY): Payer: Self-pay

## 2015-07-16 ENCOUNTER — Encounter (HOSPITAL_COMMUNITY)
Admission: RE | Admit: 2015-07-16 | Discharge: 2015-07-16 | Disposition: A | Discharge status: Home / Self Care | Payer: 59 | Source: Ambulatory Visit | Attending: Urology | Admitting: Urology

## 2015-07-16 ENCOUNTER — Encounter (INDEPENDENT_AMBULATORY_CARE_PROVIDER_SITE_OTHER): Payer: Self-pay

## 2015-07-16 HISTORY — DX: Hyperlipidemia, unspecified: E78.5

## 2015-07-16 HISTORY — DX: Personal history of other diseases of the musculoskeletal system and connective tissue: Z87.39

## 2015-07-16 HISTORY — DX: Other specified disorders of kidney and ureter: N28.89

## 2015-07-16 LAB — CBC
HCT: 35.8 % — ABNORMAL LOW (ref 36.0–46.0)
Hemoglobin: 11.6 g/dL — ABNORMAL LOW (ref 12.0–15.0)
MCH: 30.1 pg (ref 26.0–34.0)
MCHC: 32.4 g/dL (ref 30.0–36.0)
MCV: 92.7 fL (ref 78.0–100.0)
Platelets: 216 10*3/uL (ref 150–400)
RBC: 3.86 MIL/uL — ABNORMAL LOW (ref 3.87–5.11)
RDW: 15.4 % (ref 11.5–15.5)
WBC: 6.8 10*3/uL (ref 4.0–10.5)

## 2015-07-16 LAB — BASIC METABOLIC PANEL
Anion gap: 8 (ref 5–15)
BUN: 26 mg/dL — AB (ref 6–20)
CHLORIDE: 106 mmol/L (ref 101–111)
CO2: 23 mmol/L (ref 22–32)
CREATININE: 1.73 mg/dL — AB (ref 0.44–1.00)
Calcium: 9.2 mg/dL (ref 8.9–10.3)
GFR calc Af Amer: 36 mL/min — ABNORMAL LOW (ref >60)
GFR calc non Af Amer: 31 mL/min — ABNORMAL LOW (ref >60)
Glucose, Bld: 100 mg/dL — ABNORMAL HIGH (ref 65–99)
Potassium: 4.6 mmol/L (ref 3.5–5.1)
SODIUM: 137 mmol/L (ref 135–145)

## 2015-07-16 LAB — ABO/RH: ABO/RH(D): A POS

## 2015-07-16 NOTE — Progress Notes (Signed)
Abnormal BMET faxed to Dr. Manny 

## 2015-07-18 ENCOUNTER — Encounter (HOSPITAL_COMMUNITY): Payer: Self-pay | Admitting: *Deleted

## 2015-07-18 ENCOUNTER — Inpatient Hospital Stay (HOSPITAL_COMMUNITY): Payer: 59 | Admitting: Certified Registered Nurse Anesthetist

## 2015-07-18 ENCOUNTER — Encounter (HOSPITAL_COMMUNITY)
Admission: RE | Disposition: A | Discharge status: Home / Self Care | Payer: Self-pay | Source: Ambulatory Visit | Attending: Urology

## 2015-07-18 ENCOUNTER — Inpatient Hospital Stay (HOSPITAL_COMMUNITY)
Admission: RE | Admit: 2015-07-18 | Discharge: 2015-07-20 | DRG: 657 | Disposition: A | Discharge status: Home / Self Care | Payer: 59 | Source: Ambulatory Visit | Attending: Urology | Admitting: Urology

## 2015-07-18 DIAGNOSIS — E785 Hyperlipidemia, unspecified: Secondary | ICD-10-CM | POA: Diagnosis present

## 2015-07-18 DIAGNOSIS — Z6837 Body mass index (BMI) 37.0-37.9, adult: Secondary | ICD-10-CM

## 2015-07-18 DIAGNOSIS — N2889 Other specified disorders of kidney and ureter: Secondary | ICD-10-CM | POA: Diagnosis present

## 2015-07-18 DIAGNOSIS — I10 Essential (primary) hypertension: Secondary | ICD-10-CM | POA: Diagnosis present

## 2015-07-18 DIAGNOSIS — E2749 Other adrenocortical insufficiency: Secondary | ICD-10-CM | POA: Diagnosis present

## 2015-07-18 DIAGNOSIS — Z01812 Encounter for preprocedural laboratory examination: Secondary | ICD-10-CM

## 2015-07-18 DIAGNOSIS — M109 Gout, unspecified: Secondary | ICD-10-CM | POA: Diagnosis present

## 2015-07-18 DIAGNOSIS — Z87442 Personal history of urinary calculi: Secondary | ICD-10-CM | POA: Diagnosis not present

## 2015-07-18 DIAGNOSIS — E669 Obesity, unspecified: Secondary | ICD-10-CM | POA: Diagnosis present

## 2015-07-18 DIAGNOSIS — Z79899 Other long term (current) drug therapy: Secondary | ICD-10-CM

## 2015-07-18 DIAGNOSIS — C642 Malignant neoplasm of left kidney, except renal pelvis: Secondary | ICD-10-CM | POA: Diagnosis present

## 2015-07-18 DIAGNOSIS — Z8673 Personal history of transient ischemic attack (TIA), and cerebral infarction without residual deficits: Secondary | ICD-10-CM | POA: Diagnosis not present

## 2015-07-18 HISTORY — PX: NEPHRECTOMY: SHX65

## 2015-07-18 HISTORY — PX: ROBOT ASSISTED LAPAROSCOPIC NEPHRECTOMY: SHX5140

## 2015-07-18 LAB — BASIC METABOLIC PANEL
Anion gap: 7 (ref 5–15)
BUN: 22 mg/dL — AB (ref 6–20)
CO2: 25 mmol/L (ref 22–32)
CREATININE: 1.62 mg/dL — AB (ref 0.44–1.00)
Calcium: 8.6 mg/dL — ABNORMAL LOW (ref 8.9–10.3)
Chloride: 104 mmol/L (ref 101–111)
GFR calc Af Amer: 39 mL/min — ABNORMAL LOW (ref >60)
GFR, EST NON AFRICAN AMERICAN: 33 mL/min — AB (ref >60)
Glucose, Bld: 136 mg/dL — ABNORMAL HIGH (ref 65–99)
POTASSIUM: 4.4 mmol/L (ref 3.5–5.1)
SODIUM: 136 mmol/L (ref 135–145)

## 2015-07-18 LAB — CBC
HCT: 32.2 % — ABNORMAL LOW (ref 36.0–46.0)
Hemoglobin: 10.7 g/dL — ABNORMAL LOW (ref 12.0–15.0)
MCH: 30.7 pg (ref 26.0–34.0)
MCHC: 33.2 g/dL (ref 30.0–36.0)
MCV: 92.3 fL (ref 78.0–100.0)
PLATELETS: 171 10*3/uL (ref 150–400)
RBC: 3.49 MIL/uL — AB (ref 3.87–5.11)
RDW: 15.2 % (ref 11.5–15.5)
WBC: 6.6 10*3/uL (ref 4.0–10.5)

## 2015-07-18 LAB — TYPE AND SCREEN
ABO/RH(D): A POS
Antibody Screen: NEGATIVE

## 2015-07-18 SURGERY — ROBOTIC ASSISTED LAPAROSCOPIC NEPHRECTOMY
Anesthesia: General | Laterality: Left

## 2015-07-18 MED ORDER — ROCURONIUM BROMIDE 100 MG/10ML IV SOLN
INTRAVENOUS | Status: DC | PRN
  Administered 2015-07-18: 10 mg via INTRAVENOUS
  Administered 2015-07-18: 50 mg via INTRAVENOUS

## 2015-07-18 MED ORDER — SODIUM CHLORIDE 0.9 % IJ SOLN
INTRAMUSCULAR | Status: AC
  Filled 2015-07-18: qty 20

## 2015-07-18 MED ORDER — PROPOFOL 10 MG/ML IV BOLUS
INTRAVENOUS | Status: AC
  Filled 2015-07-18: qty 20

## 2015-07-18 MED ORDER — SUCCINYLCHOLINE CHLORIDE 20 MG/ML IJ SOLN
INTRAMUSCULAR | Status: DC | PRN
  Administered 2015-07-18: 100 mg via INTRAVENOUS

## 2015-07-18 MED ORDER — LOSARTAN POTASSIUM 50 MG PO TABS
50.0000 mg | ORAL_TABLET | Freq: Every morning | ORAL | Status: DC
  Administered 2015-07-18: 50 mg via ORAL
  Filled 2015-07-18: qty 1

## 2015-07-18 MED ORDER — GLYCOPYRROLATE 0.2 MG/ML IJ SOLN
INTRAMUSCULAR | Status: DC | PRN
  Administered 2015-07-18: 0.2 mg via INTRAVENOUS
  Administered 2015-07-18: 0.6 mg via INTRAVENOUS

## 2015-07-18 MED ORDER — FENTANYL CITRATE (PF) 250 MCG/5ML IJ SOLN
INTRAMUSCULAR | Status: AC
  Filled 2015-07-18: qty 25

## 2015-07-18 MED ORDER — ACETAMINOPHEN 500 MG PO TABS
1000.0000 mg | ORAL_TABLET | Freq: Four times a day (QID) | ORAL | Status: AC
  Administered 2015-07-18 – 2015-07-19 (×4): 1000 mg via ORAL
  Filled 2015-07-18 (×4): qty 2

## 2015-07-18 MED ORDER — HYDROMORPHONE HCL 1 MG/ML IJ SOLN: 0.5000 mg | INTRAMUSCULAR | Status: DC | PRN

## 2015-07-18 MED ORDER — OXYCODONE HCL 5 MG PO TABS
5.0000 mg | ORAL_TABLET | ORAL | Status: DC | PRN
  Administered 2015-07-19: 5 mg via ORAL
  Administered 2015-07-19 – 2015-07-20 (×5): 10 mg via ORAL
  Filled 2015-07-18 (×5): qty 2
  Filled 2015-07-18: qty 1
  Filled 2015-07-18: qty 2

## 2015-07-18 MED ORDER — HYDROMORPHONE HCL 1 MG/ML IJ SOLN: 0.2500 mg | INTRAMUSCULAR | Status: DC | PRN

## 2015-07-18 MED ORDER — DEXTROSE 5 % IV SOLN
INTRAVENOUS | Status: AC
  Filled 2015-07-18: qty 2

## 2015-07-18 MED ORDER — LACTATED RINGERS IR SOLN
Status: DC | PRN
  Administered 2015-07-18: 1

## 2015-07-18 MED ORDER — LABETALOL HCL 5 MG/ML IV SOLN: INTRAVENOUS | Status: DC | PRN

## 2015-07-18 MED ORDER — OXYCODONE-ACETAMINOPHEN 5-325 MG PO TABS: 1.0000 | ORAL_TABLET | Freq: Four times a day (QID) | ORAL | Status: DC | PRN

## 2015-07-18 MED ORDER — HYDROMORPHONE HCL 2 MG/ML IJ SOLN
INTRAMUSCULAR | Status: AC
  Filled 2015-07-18: qty 1

## 2015-07-18 MED ORDER — SODIUM CHLORIDE 0.9 % IJ SOLN
INTRAMUSCULAR | Status: DC | PRN
  Administered 2015-07-18: 20 mL

## 2015-07-18 MED ORDER — ESMOLOL HCL 10 MG/ML IV SOLN
INTRAVENOUS | Status: DC | PRN
  Administered 2015-07-18: 20 mg via INTRAVENOUS

## 2015-07-18 MED ORDER — EPHEDRINE SULFATE 50 MG/ML IJ SOLN
INTRAMUSCULAR | Status: DC | PRN
  Administered 2015-07-18 (×2): 10 mg via INTRAVENOUS

## 2015-07-18 MED ORDER — DEXAMETHASONE SODIUM PHOSPHATE 10 MG/ML IJ SOLN
INTRAMUSCULAR | Status: DC | PRN
  Administered 2015-07-18: 10 mg via INTRAVENOUS

## 2015-07-18 MED ORDER — PROPOFOL 10 MG/ML IV BOLUS
INTRAVENOUS | Status: DC | PRN
  Administered 2015-07-18: 130 mg via INTRAVENOUS

## 2015-07-18 MED ORDER — ONDANSETRON HCL 4 MG/2ML IJ SOLN: 4.0000 mg | INTRAMUSCULAR | Status: DC | PRN

## 2015-07-18 MED ORDER — STERILE WATER FOR IRRIGATION IR SOLN
Status: DC | PRN
  Administered 2015-07-18: 1000 mL

## 2015-07-18 MED ORDER — DEXAMETHASONE SODIUM PHOSPHATE 10 MG/ML IJ SOLN
INTRAMUSCULAR | Status: AC
  Filled 2015-07-18: qty 1

## 2015-07-18 MED ORDER — HYDROMORPHONE HCL 1 MG/ML IJ SOLN
INTRAMUSCULAR | Status: DC | PRN
  Administered 2015-07-18 (×2): .2 mg via INTRAVENOUS
  Administered 2015-07-18: .6 mg via INTRAVENOUS

## 2015-07-18 MED ORDER — SENNOSIDES-DOCUSATE SODIUM 8.6-50 MG PO TABS
1.0000 | ORAL_TABLET | Freq: Two times a day (BID) | ORAL | Status: DC
  Administered 2015-07-18 – 2015-07-20 (×4): 1 via ORAL
  Filled 2015-07-18 (×4): qty 1

## 2015-07-18 MED ORDER — MEPERIDINE HCL 50 MG/ML IJ SOLN: 6.2500 mg | INTRAMUSCULAR | Status: DC | PRN

## 2015-07-18 MED ORDER — OXYBUTYNIN CHLORIDE 5 MG PO TABS: 5.0000 mg | ORAL_TABLET | Freq: Three times a day (TID) | ORAL | Status: DC | PRN

## 2015-07-18 MED ORDER — CEFTRIAXONE SODIUM 2 G IJ SOLR
2.0000 g | INTRAMUSCULAR | Status: AC
  Administered 2015-07-18: 2 g via INTRAVENOUS

## 2015-07-18 MED ORDER — BUPIVACAINE LIPOSOME 1.3 % IJ SUSP
20.0000 mL | Freq: Once | INTRAMUSCULAR | Status: AC
  Administered 2015-07-18: 20 mL
  Filled 2015-07-18: qty 20

## 2015-07-18 MED ORDER — SENNOSIDES-DOCUSATE SODIUM 8.6-50 MG PO TABS: 2.0000 | ORAL_TABLET | Freq: Every day | ORAL | Status: DC

## 2015-07-18 MED ORDER — GLYCOPYRROLATE 0.2 MG/ML IJ SOLN
INTRAMUSCULAR | Status: AC
  Filled 2015-07-18: qty 3

## 2015-07-18 MED ORDER — MIDAZOLAM HCL 5 MG/5ML IJ SOLN
INTRAMUSCULAR | Status: DC | PRN
  Administered 2015-07-18: 2 mg via INTRAVENOUS

## 2015-07-18 MED ORDER — PRAVASTATIN SODIUM 20 MG PO TABS
20.0000 mg | ORAL_TABLET | Freq: Every day | ORAL | Status: DC
  Administered 2015-07-19: 20 mg via ORAL
  Filled 2015-07-18: qty 1

## 2015-07-18 MED ORDER — OXYCODONE HCL 5 MG PO TABS: 5.0000 mg | ORAL_TABLET | ORAL | Status: DC | PRN

## 2015-07-18 MED ORDER — LABETALOL HCL 5 MG/ML IV SOLN
INTRAVENOUS | Status: AC
  Filled 2015-07-18: qty 4

## 2015-07-18 MED ORDER — MORPHINE SULFATE (PF) 2 MG/ML IV SOLN
2.0000 mg | INTRAVENOUS | Status: DC | PRN
  Administered 2015-07-18 (×2): 4 mg via INTRAVENOUS
  Administered 2015-07-18 – 2015-07-19 (×2): 2 mg via INTRAVENOUS
  Filled 2015-07-18: qty 1
  Filled 2015-07-18 (×2): qty 2
  Filled 2015-07-18: qty 1

## 2015-07-18 MED ORDER — ONDANSETRON HCL 4 MG/2ML IJ SOLN
INTRAMUSCULAR | Status: DC | PRN
  Administered 2015-07-18: 4 mg via INTRAVENOUS

## 2015-07-18 MED ORDER — FENTANYL CITRATE (PF) 100 MCG/2ML IJ SOLN
INTRAMUSCULAR | Status: AC
  Filled 2015-07-18: qty 4

## 2015-07-18 MED ORDER — METOPROLOL TARTRATE 25 MG PO TABS
25.0000 mg | ORAL_TABLET | Freq: Two times a day (BID) | ORAL | Status: DC
  Administered 2015-07-18 – 2015-07-20 (×4): 25 mg via ORAL
  Filled 2015-07-18 (×4): qty 1

## 2015-07-18 MED ORDER — ACETAMINOPHEN 500 MG PO TABS: 1000.0000 mg | ORAL_TABLET | Freq: Four times a day (QID) | ORAL | Status: DC

## 2015-07-18 MED ORDER — ONDANSETRON HCL 4 MG/2ML IJ SOLN: 4.0000 mg | Freq: Once | INTRAMUSCULAR | Status: DC | PRN

## 2015-07-18 MED ORDER — DEXTROSE-NACL 5-0.45 % IV SOLN: INTRAVENOUS | Status: DC

## 2015-07-18 MED ORDER — LACTATED RINGERS IV SOLN
INTRAVENOUS | Status: DC | PRN
  Administered 2015-07-18 (×2): via INTRAVENOUS

## 2015-07-18 MED ORDER — LIDOCAINE HCL (CARDIAC) 20 MG/ML IV SOLN
INTRAVENOUS | Status: AC
  Filled 2015-07-18: qty 5

## 2015-07-18 MED ORDER — NEOSTIGMINE METHYLSULFATE 10 MG/10ML IV SOLN
INTRAVENOUS | Status: DC | PRN
  Administered 2015-07-18: 5 mg via INTRAVENOUS

## 2015-07-18 MED ORDER — HYDRALAZINE HCL 20 MG/ML IJ SOLN: 5.0000 mg | INTRAMUSCULAR | Status: DC | PRN

## 2015-07-18 MED ORDER — DEXTROSE-NACL 5-0.45 % IV SOLN
INTRAVENOUS | Status: DC
  Administered 2015-07-18 (×2): via INTRAVENOUS

## 2015-07-18 MED ORDER — ONDANSETRON HCL 4 MG/2ML IJ SOLN
INTRAMUSCULAR | Status: AC
  Filled 2015-07-18: qty 2

## 2015-07-18 MED ORDER — LABETALOL HCL 5 MG/ML IV SOLN
INTRAVENOUS | Status: DC | PRN
Start: 2015-07-18 — End: 2015-07-18
  Administered 2015-07-18: 5 mg via INTRAVENOUS
  Administered 2015-07-18 (×2): 2.5 mg via INTRAVENOUS

## 2015-07-18 MED ORDER — FENTANYL CITRATE (PF) 100 MCG/2ML IJ SOLN
INTRAMUSCULAR | Status: DC | PRN
  Administered 2015-07-18: 100 ug via INTRAVENOUS
  Administered 2015-07-18 (×2): 50 ug via INTRAVENOUS
  Administered 2015-07-18: 100 ug via INTRAVENOUS
  Administered 2015-07-18: 50 ug via INTRAVENOUS

## 2015-07-18 MED ORDER — ROCURONIUM BROMIDE 100 MG/10ML IV SOLN
INTRAVENOUS | Status: AC
  Filled 2015-07-18: qty 1

## 2015-07-18 MED ORDER — EPHEDRINE SULFATE 50 MG/ML IJ SOLN
INTRAMUSCULAR | Status: AC
  Filled 2015-07-18: qty 1

## 2015-07-18 MED ORDER — SODIUM CHLORIDE 0.9 % IJ SOLN
INTRAMUSCULAR | Status: AC
  Filled 2015-07-18: qty 10

## 2015-07-18 MED ORDER — MIDAZOLAM HCL 2 MG/2ML IJ SOLN
INTRAMUSCULAR | Status: AC
  Filled 2015-07-18: qty 4

## 2015-07-18 MED ORDER — LIDOCAINE HCL (CARDIAC) 20 MG/ML IV SOLN
INTRAVENOUS | Status: DC | PRN
  Administered 2015-07-18: 100 mg via INTRAVENOUS

## 2015-07-18 SURGICAL SUPPLY — 55 items
CANNULA SEAL DVNC (CANNULA) IMPLANT
CANNULA SEALS DA VINCI (CANNULA) ×8
CHLORAPREP W/TINT 26ML (MISCELLANEOUS) ×3 IMPLANT
CLIP LIGATING HEM O LOK PURPLE (MISCELLANEOUS) ×2 IMPLANT
CLIP LIGATING HEMO LOK XL GOLD (MISCELLANEOUS) ×5 IMPLANT
CLIP LIGATING HEMO O LOK GREEN (MISCELLANEOUS) ×2 IMPLANT
CORDS BIPOLAR (ELECTRODE) ×3 IMPLANT
COVER SURGICAL LIGHT HANDLE (MISCELLANEOUS) ×1 IMPLANT
COVER TIP SHEARS 8 DVNC (MISCELLANEOUS) ×1 IMPLANT
COVER TIP SHEARS 8MM DA VINCI (MISCELLANEOUS) ×2
CUTTER ECHEON FLEX ENDO 45 340 (ENDOMECHANICALS) ×2 IMPLANT
DECANTER SPIKE VIAL GLASS SM (MISCELLANEOUS) ×3 IMPLANT
DRAIN CHANNEL 15F RND FF 3/16 (WOUND CARE) IMPLANT
DRAPE ARM DVNC X/XI (DISPOSABLE) IMPLANT
DRAPE COLUMN DVNC XI (DISPOSABLE) IMPLANT
DRAPE DA VINCI XI ARM (DISPOSABLE) ×8
DRAPE DA VINCI XI COLUMN (DISPOSABLE) ×2
DRAPE INCISE IOBAN 66X45 STRL (DRAPES) ×3 IMPLANT
DRAPE LAPAROSCOPIC ABDOMINAL (DRAPES) ×3 IMPLANT
DRAPE SHEET LG 3/4 BI-LAMINATE (DRAPES) ×3 IMPLANT
DRAPE WARM FLUID 44X44 (DRAPE) ×1 IMPLANT
ELECT PENCIL ROCKER SW 15FT (MISCELLANEOUS) ×3 IMPLANT
ELECT REM PT RETURN 9FT ADLT (ELECTROSURGICAL) ×3
ELECTRODE REM PT RTRN 9FT ADLT (ELECTROSURGICAL) ×2 IMPLANT
EVACUATOR SILICONE 100CC (DRAIN) IMPLANT
GLOVE BIO SURGEON STRL SZ 6.5 (GLOVE) ×4 IMPLANT
GLOVE BIO SURGEONS STRL SZ 6.5 (GLOVE) ×3
GLOVE BIOGEL M STRL SZ7.5 (GLOVE) ×8 IMPLANT
GOWN STRL REUS W/TWL LRG LVL3 (GOWN DISPOSABLE) ×11 IMPLANT
KIT ACCESSORY DA VINCI DISP (KITS)
KIT ACCESSORY DVNC DISP (KITS) ×1 IMPLANT
KIT BASIN OR (CUSTOM PROCEDURE TRAY) ×3 IMPLANT
LIQUID BAND (GAUZE/BANDAGES/DRESSINGS) ×4 IMPLANT
LOOP VESSEL MAXI BLUE (MISCELLANEOUS) ×3 IMPLANT
NDL INSUFFLATION 14GA 120MM (NEEDLE) ×1 IMPLANT
NEEDLE INSUFFLATION 14GA 120MM (NEEDLE) ×3 IMPLANT
POSITIONER SURGICAL ARM (MISCELLANEOUS) ×8 IMPLANT
POUCH ENDO CATCH II 15MM (MISCELLANEOUS) ×3 IMPLANT
RELOAD WH ECHELON 45 (STAPLE) ×4 IMPLANT
SET TUBE IRRIG SUCTION NO TIP (IRRIGATION / IRRIGATOR) ×3 IMPLANT
SOLUTION ELECTROLUBE (MISCELLANEOUS) ×3 IMPLANT
SPONGE LAP 4X18 X RAY DECT (DISPOSABLE) ×3 IMPLANT
SUT ETHILON 3 0 PS 1 (SUTURE) IMPLANT
SUT MNCRL AB 4-0 PS2 18 (SUTURE) ×6 IMPLANT
SUT PDS AB 1 CT1 27 (SUTURE) ×10 IMPLANT
SUT VICRYL 0 UR6 27IN ABS (SUTURE) ×6 IMPLANT
TAPE CLOTH 4X10 WHT NS (GAUZE/BANDAGES/DRESSINGS) ×3 IMPLANT
TOWEL OR NON WOVEN STRL DISP B (DISPOSABLE) ×4 IMPLANT
TRAY FOLEY W/METER SILVER 14FR (SET/KITS/TRAYS/PACK) ×3 IMPLANT
TRAY FOLEY W/METER SILVER 16FR (SET/KITS/TRAYS/PACK) ×1 IMPLANT
TRAY LAPAROSCOPIC (CUSTOM PROCEDURE TRAY) ×3 IMPLANT
TROCAR 12M 150ML BLUNT (TROCAR) ×1 IMPLANT
TROCAR BLADELESS OPT 5 100 (ENDOMECHANICALS) ×1 IMPLANT
TROCAR XCEL 12X100 BLDLESS (ENDOMECHANICALS) ×3 IMPLANT
WATER STERILE IRR 1500ML POUR (IV SOLUTION) ×4 IMPLANT

## 2015-07-18 NOTE — H&P (Signed)
[name] is an 59 y.o. female.    Chief Complaint: Pre-OP Left Robtic Radical Nephrectomy  HPI:   1 - Left Renal Mass - 5.5cm left upper pole mass by non-con CT 2016. Trauma CT 2008 with 2cm enhancing mass in similar location worrisome for RCC at that point. Contrast CT 05/2015 confirms approx 5.5cm left upper renal mass, about 70% endophytic and abuts hilar fat / 1 artery / 1 vein renovascular anatomy, CXR w/o chest masses.  2 - Nephrolithiasis - Remote history of stone passed medially years ago. 06/2014 - Rt URS for 74m ureteral and several intrarenal stones to stone free, composition 75% CaOx / 25% CaPO4.   3 - Stage 3 Renal Insufficiency - Cr 1.47 / GFR 40 by serum labs 05/2015. Rt>Lt renal atrophy by axial imaging.   PMH sig for HTN, HLD, Obesity. NO CV disease. NO non-endoscopic chest / abd surgeries. Her [facility]  Today " [name]" is seen to proceed with left radical nephrectomy.   Past Medical History  Diagnosis Date  . Hypertension   . Kidney stones     bilat   . Hyperlipidemia   . Stroke     pt states had "mini stroke" in 2010  . History of gout     x1 left foot  . Renal mass, left     Past Surgical History  Procedure Laterality Date  . Tubal ligation    . Cesarean section      times 2  . Cystoscopy with retrograde pyelogram, ureteroscopy and stent placement Bilateral 06/08/2015    Procedure: CYSTOSCOPY WITH RETROGRADE PYELOGRAM, URETEROSCOPY, STONE EXTRACTION AND STENT PLACEMENT ON THE RIGHT, DIAGNOSTIC URETEROSCOPY AND STENT PLACEMENT ON THE LEFT;  Surgeon: TAlexis Frock MD;  Location: [facility];  Service: Urology;  Laterality: Bilateral;  . Holmium laser application Right 80/12/2334   Procedure: HOLMIUM LASER APPLICATION;  Surgeon: TAlexis Frock MD;  Location: [facility];  Service: Urology;  Laterality: Right;    No family history on file. Social History:  reports that she has never smoked. She has never used smokeless tobacco. She  reports that she does not drink alcohol or use illicit drugs.  Allergies: No Known Allergies  Medications Prior to Admission  Medication Sig Dispense Refill  . aspirin EC 81 MG tablet Take 81 mg by mouth every morning.     . Cholecalciferol (VITAMIN D3) 5000 UNITS CAPS Take 5,000 Units by mouth every morning.     .Marland Kitchenlosartan (COZAAR) 50 MG tablet Take 50 mg by mouth every morning.     . lovastatin (MEVACOR) 20 MG tablet Take 20 mg by mouth every morning.     . metoprolol tartrate (LOPRESSOR) 25 MG tablet Take 25 mg by mouth 2 (two) times daily.    .Marland KitchenoxyCODONE-acetaminophen (ROXICET) 5-325 MG per tablet Take 1 tablet by mouth every 4 (four) hours as needed for moderate pain or severe pain. Post-operatively 30 tablet 0  . senna-docusate (SENOKOT S) 8.6-50 MG per tablet Take 1 tablet by mouth 2 (two) times daily. While taking pain meds to prevent constipation (Patient taking differently: Take 1 tablet by mouth 2 (two) times daily as needed for mild constipation. ) 30 tablet 0    Results for orders placed or performed during the hospital encounter of 07/16/15 (from the past 48 hour(s))  CBC     Status: Abnormal   Collection Time: 07/16/15  8:45 AM  Result Value Ref Range  WBC 6.8 4.0 - 10.5 K/uL   RBC 3.86 (L) 3.87 - 5.11 MIL/uL   Hemoglobin 11.6 (L) 12.0 - 15.0 g/dL   HCT 35.8 (L) 36.0 - 46.0 %   MCV 92.7 78.0 - 100.0 fL   MCH 30.1 26.0 - 34.0 pg   MCHC 32.4 30.0 - 36.0 g/dL   RDW 15.4 11.5 - 15.5 %   Platelets 216 150 - 400 K/uL  Basic metabolic panel     Status: Abnormal   Collection Time: 07/16/15  8:45 AM  Result Value Ref Range   Sodium 137 135 - 145 mmol/L   Potassium 4.6 3.5 - 5.1 mmol/L   Chloride 106 101 - 111 mmol/L   CO2 23 22 - 32 mmol/L   Glucose, Bld 100 (H) 65 - 99 mg/dL   BUN 26 (H) 6 - 20 mg/dL   Creatinine, Ser 1.73 (H) 0.44 - 1.00 mg/dL   Calcium 9.2 8.9 - 10.3 mg/dL   GFR calc non Af Amer 31 (L) >60 mL/min   GFR calc Af Amer 36 (L) >60 mL/min    Comment:  (NOTE) The eGFR has been calculated using the CKD EPI equation. This calculation has not been validated in all clinical situations. eGFR's persistently <60 mL/min signify possible Chronic Kidney Disease.    Anion gap 8 5 - 15  Type and screen     Status: None   Collection Time: 07/16/15  8:45 AM  Result Value Ref Range   ABO/RH(D) A POS    Antibody Screen NEG    Sample Expiration 07/21/2015   ABO/Rh     Status: None   Collection Time: 07/16/15  8:45 AM  Result Value Ref Range   ABO/RH(D) A POS    No results found.  Review of Systems  Constitutional: Negative.   HENT: Negative.   Eyes: Negative.   Respiratory: Negative.   Cardiovascular: Negative.   Gastrointestinal: Negative.   Genitourinary: Negative.   Musculoskeletal: Negative.   Skin: Negative.   Neurological: Negative.   Endo/Heme/Allergies: Negative.   Psychiatric/Behavioral: Negative.     Blood pressure 176/97, pulse 54, temperature 97.3 F (36.3 C), temperature source Oral, resp. rate 18, SpO2 98 %. Physical Exam  Constitutional: She appears well-developed.  HENT:  Head: Normocephalic.  Eyes: Pupils are equal, round, and reactive to light.  Neck: Normal range of motion.  Cardiovascular: Normal rate.   Respiratory: Effort normal.  GI: Soft.  Genitourinary:  No CVAT  Musculoskeletal: Normal range of motion.  Neurological: She is alert.  Skin: Skin is warm.  Psychiatric: She has a normal mood and affect. Her behavior is normal. Judgment and thought content normal.     Assessment/Plan  1 - Left Renal Mass -  We rediscussed the role of radical nephrectomy with the overall goal of complete surgical excision (negative margins) and better staging / diagnosis. We specifically rediscussed that with removal of the kidney there would be an overall renal function decline with attendant risks of renal failure and need for dialysis in some cases, and need for kidney-friendly lifestyle post-op with excellent blood  pressure and glycemic control. We then discussed surgical approaches including robotic and open techniques with robotic associated with a shorter convalescence. I reshowed the patient on their abdomen the approximately 4-6 incision (trocar) sites as well as presumed extraction sites with robotic approach as well as possible open incision sites. We specifically readdressed that there may be need to alter operative plans according to intraopertive findings including conversion to  open procedure. We rediscussed specific peri-operative risks including bleeding, infection, deep vein thrombosis, pulmonary embolism, compartment syndrome, nuropathy / neuropraxia, heart attack, stroke, death, as well as long-term risks such as non-cure / need for additional therapy and need for imaging and lab based post-op surveillance protocols. We rediscussed typical hospital course of approximately 2 day hospitalization, need for peri-operative drains / catheters, and typical post-hospital course with return to most non-strenuous activities by 2 weeks and ability to return to most jobs and more strenuous activity such as exercise by 6 weeks.    After this lengthy and detail discussion, including answering all of the patient's questions to their satisfaction, they have chosen to proceed for left radical nephrectomy as planned.  2 - Nephrolithiasis - now stone free. Will req at least annual surveillance going forward given her relative renal insufficiency.   3 - Stage 3 Renal Insufficiency - likely combination some stone obstruciton and medical renal disease. She has very real risk of progression to ESRD in future in likght of poor baseline funciton and need for left nephron removal from cancer.    Alan Drummer 07/18/2015, 6:32 AM

## 2015-07-18 NOTE — Anesthesia Procedure Notes (Signed)
Procedure Name: Intubation Date/Time: 07/18/2015 8:40 AM Performed by: Maxwell Caul Pre-anesthesia Checklist: Patient identified, Emergency Drugs available, Suction available and Patient being monitored Patient Re-evaluated:Patient Re-evaluated prior to inductionOxygen Delivery Method: Circle System Utilized Preoxygenation: Pre-oxygenation with 100% oxygen Intubation Type: IV induction Ventilation: Mask ventilation without difficulty Laryngoscope Size: Mac and 4 Grade View: Grade I Tube type: Oral Tube size: 7.0 mm Number of attempts: 1 Airway Equipment and Method: Stylet Placement Confirmation: ETT inserted through vocal cords under direct vision,  positive ETCO2 and breath sounds checked- equal and bilateral Secured at: 20 cm Tube secured with: Tape Dental Injury: Teeth and Oropharynx as per pre-operative assessment

## 2015-07-18 NOTE — Brief Op Note (Signed)
07/18/2015  11:22 AM  PATIENT:  [name]  59 y.o. female  PRE-OPERATIVE DIAGNOSIS:  LEFT RENAL MASS  POST-OPERATIVE DIAGNOSIS:  LEFT RENAL MASS  PROCEDURE:  Procedure(s): ROBOTIC ASSISTED LAPAROSCOPIC NEPHRECTOMY (Left)  SURGEON:  Surgeon(s) and Role:    * Alexis Frock, MD - Primary  PHYSICIAN ASSISTANT:   ASSISTANTS: 1 -Clemetine Marker PA, 2 - Jearld Adjutant MD   ANESTHESIA:   general  EBL:  Total I/O In: 2000 [I.V.:2000] Out: 250 [Urine:200; Blood:50]  BLOOD ADMINISTERED:none  DRAINS: none   LOCAL MEDICATIONS USED:  MARCAINE     SPECIMEN:  Source of Specimen:  Lt kidney + adrenal  DISPOSITION OF SPECIMEN:  PATHOLOGY  COUNTS:  YES  TOURNIQUET:  * No tourniquets in log *  DICTATION: .Other Dictation: Dictation Number [id number]  PLAN OF CARE: Admit to inpatient   PATIENT DISPOSITION:  PACU - hemodynamically stable.   Delay start of Pharmacological VTE agent (>24hrs) due to surgical blood loss or risk of bleeding: yes

## 2015-07-18 NOTE — Transfer of Care (Signed)
Immediate Anesthesia Transfer of Care Note  Patient: [name]  Procedure(s) Performed: Procedure(s): ROBOTIC ASSISTED LAPAROSCOPIC NEPHRECTOMY (Left)  Patient Location: PACU  Anesthesia Type:General  Level of Consciousness:  sedated, patient cooperative and responds to stimulation  Airway & Oxygen Therapy:Patient Spontanous Breathing and Patient connected to face mask oxgen  Post-op Assessment:  Report given to PACU RN and Post -op Vital signs reviewed and stable  Post vital signs:  Reviewed and stable  Last Vitals:  Filed Vitals:   07/18/15 0626  BP: 176/97  Pulse: 54  Temp: 36.3 C  Resp: 18    Complications: No apparent anesthesia complications

## 2015-07-18 NOTE — Anesthesia Postprocedure Evaluation (Signed)
Anesthesia Post Note  Patient: [name]  Procedure(s) Performed: Procedure(s) (LRB): ROBOTIC ASSISTED LAPAROSCOPIC NEPHRECTOMY (Left)  Anesthesia type: general  Patient location: PACU  Post pain: Pain level controlled  Post assessment: Patient's Cardiovascular Status Stable  Last Vitals:  Filed Vitals:   07/18/15 1623  BP: 161/76  Pulse: 74  Temp: 36.2 C  Resp: 18    Post vital signs: Reviewed and stable  Level of consciousness: sedated  Complications: No apparent anesthesia complications

## 2015-07-18 NOTE — Discharge Instructions (Signed)
1.  Activity:  You are encouraged to ambulate frequently (about every hour during waking hours) to help prevent blood clots from forming in your legs or lungs.  However, you should not engage in any heavy lifting (> 10-15 lbs), strenuous activity, or straining. 2. Diet: You should advance your diet as instructed by your physician.  It will be normal to have some bloating, nausea, and abdominal discomfort intermittently. 3. Prescriptions:  You will be provided a prescription for pain medication to take as needed.  If your pain is not severe enough to require the prescription pain medication, you may take extra strength Tylenol instead which will have less side effects.  You should also take a prescribed stool softener to avoid straining with bowel movements as the prescription pain medication may constipate you. 4. Incisions: You may remove your dressing bandages 48 hours after surgery if not removed in the hospital.  You will either have some small staples or special tissue glue at each of the incision sites. Once the bandages are removed (if present), the incisions may stay open to air.  You may start showering (but not soaking or bathing in water) the 2nd day after surgery and the incisions simply need to be patted dry after the shower.  No additional care is needed. 5. What to call us about: You should call the office [phone]) if you develop fever > 101 or develop persistent vomiting.  You may restart advil, aleve, aspirin, vitamins, and supplements 7 days after surgery.

## 2015-07-18 NOTE — Anesthesia Preprocedure Evaluation (Signed)
Anesthesia Evaluation  Patient identified by MRN, date of birth, ID band Patient awake    Reviewed: Allergy & Precautions, NPO status , Patient's Chart, lab work & pertinent test results  Airway Mallampati: II  TM Distance: >3 FB Neck ROM: Full    Dental   Pulmonary    Pulmonary exam normal        Cardiovascular hypertension, Pt. on medications Normal cardiovascular exam     Neuro/Psych    GI/Hepatic   Endo/Other    Renal/GU      Musculoskeletal   Abdominal   Peds  Hematology   Anesthesia Other Findings   Reproductive/Obstetrics                             Anesthesia Physical Anesthesia Plan  ASA: III  Anesthesia Plan: General   Post-op Pain Management:    Induction: Intravenous  Airway Management Planned: Oral ETT  Additional Equipment:   Intra-op Plan:   Post-operative Plan: Extubation in OR  Informed Consent:   Plan Discussed with: CRNA and Surgeon  Anesthesia Plan Comments:         Anesthesia Quick Evaluation

## 2015-07-19 LAB — BASIC METABOLIC PANEL
Anion gap: 7 (ref 5–15)
BUN: 21 mg/dL — AB (ref 6–20)
CO2: 26 mmol/L (ref 22–32)
CREATININE: 2.12 mg/dL — AB (ref 0.44–1.00)
Calcium: 8.4 mg/dL — ABNORMAL LOW (ref 8.9–10.3)
Chloride: 99 mmol/L — ABNORMAL LOW (ref 101–111)
GFR, EST AFRICAN AMERICAN: 28 mL/min — AB (ref >60)
GFR, EST NON AFRICAN AMERICAN: 24 mL/min — AB (ref >60)
Glucose, Bld: 109 mg/dL — ABNORMAL HIGH (ref 65–99)
POTASSIUM: 3.9 mmol/L (ref 3.5–5.1)
SODIUM: 132 mmol/L — AB (ref 135–145)

## 2015-07-19 LAB — CBC
HCT: 29.7 % — ABNORMAL LOW (ref 36.0–46.0)
Hemoglobin: 9.7 g/dL — ABNORMAL LOW (ref 12.0–15.0)
MCH: 29.8 pg (ref 26.0–34.0)
MCHC: 32.7 g/dL (ref 30.0–36.0)
MCV: 91.1 fL (ref 78.0–100.0)
PLATELETS: 167 10*3/uL (ref 150–400)
RBC: 3.26 MIL/uL — AB (ref 3.87–5.11)
RDW: 15.5 % (ref 11.5–15.5)
WBC: 7.8 10*3/uL (ref 4.0–10.5)

## 2015-07-19 NOTE — Op Note (Signed)
[name], [name]  MEDICAL RECORD NO.:  [id number]  LOCATION:  [id number]                         FACILITY:  [facility]  PHYSICIAN:  Alexis Frock, MD     DATE OF BIRTH:  24-Apr-1956  DATE OF PROCEDURE: 07/18/2015                              OPERATIVE REPORT  DIAGNOSIS:  Enhancing large left renal mass.  PROCEDURE:  Robotic-assisted laparoscopic left radical nephrectomy.  ESTIMATED BLOOD LOSS:  100 mL.  COMPLICATIONS:  None.  SPECIMEN:  Left radical nephrectomy with adrenal.  ASSISTANT: 1. Clemetine Marker, PA. 2. Jearld Adjutant, M.D.  FINDINGS: 1. Single artery, single vein left renovascular anatomy. 2. Significant desmoplastic reaction of the medial plane between the     psoas muscle and kidney. 3. Significant left mid adrenal arterial branch.  INDICATIONS:  [name] is a pleasant 59 year old lady who was found on CT imaging to have an increasing in size solid left renal mass approximately 5.5 cm.  The mass was very endophytic, abutting hilar fat. She was noted to have nephrolithiasis at that time.  She underwent ureteroscopy bilaterally for goal of stone free prior to management of her kidney.  In terms of management of her left renal mass, options were discussed including surveillance protocols versus ablative therapies versus surgical extirpation, nephron sparing and with and without minimally invasive assistance.  The patient does have some baseline renal insufficiency as well as some right renal atrophy.  We discussed that the radical nephrectomy although her oncologic outcomes would likely be best her chances of progression to end-stage renal disease is certainly be the most.  Nevertheless, she wished to proceed with the most oncologically aggressive pass with left radical nephrectomy. Informed consent was obtained and placed in the medical record.  PROCEDURE IN DETAIL:  The patient being [name], was verified. Procedure being  left radical nephrectomy was confirmed.  Procedure was carried out.  Time-out was performed.  Intravenous antibiotics were administered.  General endotracheal anesthesia was induced.  The patient was placed into a left side up full flank position applying 15 degrees stable flexion, superior arm elevator, axillary roll, sequential compression devices, bottom leg bent, top leg was straight.  She was further fashioned on the operative table using 3-inch tape over foam padding and crossed her supraxiphoid abdomen and her pelvis.  Beanbag was also deployed.  Sterile field was created by prepping and draping the patient's entire left flank and abdomen using chlorhexidine gluconate.  Next, a high-flow, low-pressure pneumoperitoneum was obtained using Veress technique in the left lower quadrant having passed the aspiration and drop test.  Next, an 8-mm robotic camera port was placed and positioned approximately 1-1/2 handbreadth superolateral to the umbilicus.  Laparoscopic examination of the peritoneal cavity revealed no significant adhesions, no visceral injury.  There was an abundance of intraperitoneal fat as expected.  Additional ports were placed as follows; left subcostal 8-mm robotic port, left far lateral 8- mm robotic port approximately 1 handbreadth superomedial to the anterior iliac spine, left paramedian inferior robotic port approximately 1 handbreadth superior to the pubic ramus and two 12-mm assistant ports in the midline; one 2 fingerbreadths above the camera port and one 2 fingerbreadths below  the camera port.  The inferior one being just in the superior umbilical fold.  Robot was docked and passed through electronic checks.  Initial attention was directed to the development of retroperitoneum.  Incision was made lateral to the descending colon from the area of splenic flexure towards the area of the internal ring and the colon was carefully swept medially allowing exposure of  the anterior surface of Gerota's fascia.  Lateral splenic attachments were taken down allowing the spleen to rotate medially.  The pancreas was encountered and kocherized medially.  Lower pole of the kidney was identified and placed on gentle lateral traction.  Dissection proceeded medial to this. The ureter and gonadal vessels were encountered and placed on gentle lateral traction.  The plane between the ureter and the psoas musculature was incredibly desmoplastic likely consistent with prior nephrolithiasis treatments.  Dissection proceeded in this plane very carefully towards the area of the renal hilum.  Renal hilum consisted of single artery, single vein, renovascular anatomy.  The artery was controlled using extra large clip proximally followed by the vascular load stapler distally.  The vein was controlled using vascular load stapler.  Dissection proceeded superiorly keeping appeared to be the majority of the adrenal gland with the nephrectomy specimen. Posteriorly approximately at midportion of the adrenal, there was a very large vessel coursing towards this.  This was controlled using extra large clip down x2.  Superior and lateral attachments were taken down. The gonadal vessels were controlled using extra large clips, this completely freed up the left radical nephrectomy specimen, which was placed in an extra large EndoCatch bag.  Notably, the ureter was also controlled using extra large clip proximal and distal.  Following these maneuvers, there was excellent hemostasis.  No evidence of visceral injury.  Robot was undocked.  Specimen was retrieved by connecting the two previous assistant port sites in a superior umbilical midline, removing the nephrectomy specimen and setting it aside for permanent pathology.  The extraction site was closed at the level of fascia using figure-of-eight PDS x6, reapproximation of the Scarpa's with running Vicryl.  All incision sites were  infiltrated with dilute lyophilized Marcaine and closed at the level of skin using subcuticular Monocryl followed by Dermabond.  Procedure was then terminated.  The patient tolerated the procedure well.  There were no immediate periprocedural complications.  The patient was taken to the postanesthesia care unit in stable condition.          ______________________________ Alexis Frock, MD     TM/MEDQ  D:  07/18/2015  T:  07/19/2015  Job:  [id number]

## 2015-07-19 NOTE — Progress Notes (Signed)
1 Day Post-Op  Subjective:  1 - Left Renal Mass  - s/p robotic left radical nephrectomy 07/18/15 for large endophytic mass. Path pending.   2 - Renal Insufficiency - pre nephrectomy Cr 1.6.   Today " Chanda" is feeling good. Pain controlled, tolerating clear, ambulated some, Cr within safe range.   Objective: Vital signs in last 24 hours: Temp:  [94.5 F (34.7 C)-97.8 F (36.6 C)] 97.6 F (36.4 C) (09/15 0424) Pulse Rate:  [47-74] 58 (09/15 0424) Resp:  [13-18] 18 (09/15 0424) BP: (105-198)/(65-89) 105/65 mmHg (09/15 0424) SpO2:  [96 %-100 %] 97 % (09/15 0424)    Intake/Output from previous day: 09/14 0701 - 09/15 0700 In: 4189.6 [I.V.:4139.6; IV Piggyback:50] Out: 850 [Urine:800; Blood:50] Intake/Output this shift:    General appearance: alert, cooperative, appears stated age and family at bedside Eyes: negative Nose: Nares normal. Septum midline. Mucosa normal. No drainage or sinus tenderness. Throat: lips, mucosa, and tongue normal; teeth and gums normal and poor dentition Back: symmetric, no curvature. ROM normal. No CVA tenderness. Resp: non-labored on minimal [place] O2 Cardio: Nl rate GI: soft, non-tender; bowel sounds normal; no masses,  no organomegaly Pelvic: external genitalia normal and foley c/d/i with clear urien.  Extremities: extremities normal, atraumatic, no cyanosis or edema Pulses: 2+ and symmetric Skin: Skin color, texture, turgor normal. No rashes or lesions Lymph nodes: Cervical, supraclavicular, and axillary nodes normal. Neurologic: Grossly normal Incision/Wound: recent port sites / extraction sites c/d/i. No hernias.   Lab Results:   Recent Labs  07/18/15 1150 07/19/15 0456  WBC 6.6 7.8  HGB 10.7* 9.7*  HCT 32.2* 29.7*  PLT 171 167   BMET  Recent Labs  07/18/15 1150 07/19/15 0456  NA 136 132*  K 4.4 3.9  CL 104 99*  CO2 25 26  GLUCOSE 136* 109*  BUN 22* 21*  CREATININE 1.62* 2.12*  CALCIUM 8.6* 8.4*   PT/INR No results for  input(s): LABPROT, INR in the last 72 hours. ABG No results for input(s): PHART, HCO3 in the last 72 hours.  Invalid input(s): PCO2, PO2  Studies/Results: No results found.  Anti-infectives: Anti-infectives    Start     Dose/Rate Route Frequency Ordered Stop   07/18/15 0643  cefTRIAXone (ROCEPHIN) 2 g in dextrose 5 % 50 mL IVPB     2 g 100 mL/hr over 30 Minutes Intravenous 30 min pre-op 07/18/15 [id number] 07/18/15 0850      Assessment/Plan:  1 - Left Renal Mass  - doing well POD 1. Adv diet, DC foley, Saline lock, Ambulate. Likely DC 9/16.   2 - Renal Insufficiency - hold ARB. Check BMP tomorrow AM, UOP acceptable and GFR overall within safe range.     [facility], Diallo Ponder 07/19/2015

## 2015-07-20 LAB — BASIC METABOLIC PANEL
Anion gap: 6 (ref 5–15)
BUN: 21 mg/dL — ABNORMAL HIGH (ref 6–20)
CHLORIDE: 102 mmol/L (ref 101–111)
CO2: 25 mmol/L (ref 22–32)
CREATININE: 2.62 mg/dL — AB (ref 0.44–1.00)
Calcium: 8.6 mg/dL — ABNORMAL LOW (ref 8.9–10.3)
GFR, EST AFRICAN AMERICAN: 22 mL/min — AB (ref >60)
GFR, EST NON AFRICAN AMERICAN: 19 mL/min — AB (ref >60)
Glucose, Bld: 103 mg/dL — ABNORMAL HIGH (ref 65–99)
POTASSIUM: 4.3 mmol/L (ref 3.5–5.1)
SODIUM: 133 mmol/L — AB (ref 135–145)

## 2015-07-20 MED ORDER — SENNOSIDES-DOCUSATE SODIUM 8.6-50 MG PO TABS: 1.0000 | ORAL_TABLET | Freq: Two times a day (BID) | ORAL | Status: DC

## 2015-07-20 NOTE — Progress Notes (Signed)
Pt complaining of right ankle pain. Pt was up walking several times today. Ankle does not appear swollen or warm to touch. Will continue to monitor.

## 2015-07-20 NOTE — Discharge Summary (Signed)
Physician Discharge Summary  Patient ID: [name] MRN: [id number] DOB/AGE: [birth year] 59 y.o.  Admit date: 07/18/2015 Discharge date: 07/20/2015  Admission Diagnoses: Left Renal Mass  Discharge Diagnoses:  Active Problems:   Renal mass   Discharged Condition: good  Hospital Course:   1 - Left Renal Mass - s/p robotic left radical nephrectomy 07/18/15 for large endophytic mass. Path pending.   2 - Renal Insufficiency - pre nephrectomy Cr 1.6. Cr at discharge 2.6 with excellent UOP and no hyperkalemia. Holding ARB.  By POD 2, the day of discharge, she is abmbulatory, pain controlle don PO meds, maintaining hydration, and felt to be adequtae for discharge. She does have some mild Rt ankle pain (chronic) slighly worse over baseline c/w likely mild gout flair, but she desires outpatient management.     Consults: None  Significant Diagnostic Studies: labs: as per above  Treatments: surgery: robotic left radical nephrectomy 07/18/15  Discharge Exam: Blood pressure 110/68, pulse 76, temperature 98.1 F (36.7 C), temperature source Oral, resp. rate 18, height 5\' 1"  (1.549 m), weight 88.905 kg (196 lb), SpO2 96 %. General appearance: alert, cooperative and appears stated age Eyes: negative Nose: Nares normal. Septum midline. Mucosa normal. No drainage or sinus tenderness. Throat: lips, mucosa, and tongue normal; teeth and gums normal and poor dentition, stable Neck: supple, symmetrical, trachea midline Back: symmetric, no curvature. ROM normal. No CVA tenderness. Resp: non-labored on room air Cardio: Nl rate GI: soft, non-tender; bowel sounds normal; no masses,  no organomegaly Extremities: extremities normal, atraumatic, no cyanosis or edema and mild Rt ankle pain with deep palpaation w/o swelling, erythema, or cords.  Pulses: 2+ and symmetric Skin: Skin color, texture, turgor normal. No rashes or lesions Lymph nodes: Cervical, supraclavicular, and axillary nodes  normal. Neurologic: Grossly normal  Recent port sites and extraction sites c/d/i. No hernias.   Disposition: 01-Home or Self Care     Medication List    STOP taking these medications        aspirin EC 81 MG tablet     losartan 50 MG tablet  Commonly known as:  COZAAR     Vitamin D3 5000 UNITS Caps      TAKE these medications        lovastatin 20 MG tablet  Commonly known as:  MEVACOR  Take 20 mg by mouth every morning.     metoprolol tartrate 25 MG tablet  Commonly known as:  LOPRESSOR  Take 25 mg by mouth 2 (two) times daily.     oxyCODONE-acetaminophen 5-325 MG per tablet  Commonly known as:  ROXICET  Take 1-2 tablets by mouth every 6 (six) hours as needed for moderate pain or severe pain. Post-operatively     senna-docusate 8.6-50 MG per tablet  Commonly known as:  SENOKOT S  Take 1 tablet by mouth 2 (two) times daily. While taking pain meds to prevent constipation     senna-docusate 8.6-50 MG per tablet  Commonly known as:  Senokot-S  Take 1 tablet by mouth 2 (two) times daily. While taking pain meds to prevent constipation.           Follow-up Information    Follow up with Alexis Frock, MD On 08/02/2015.   Specialty:  Urology   Why:  at 11:15   Contact information:   [address] [place] [address] [phone]       Signed: Alexis Frock 07/20/2015, 7:26 AM

## 2015-11-01 ENCOUNTER — Other Ambulatory Visit: Payer: Self-pay

## 2015-11-01 DIAGNOSIS — Z1231 Encounter for screening mammogram for malignant neoplasm of breast: Secondary | ICD-10-CM

## 2015-11-21 ENCOUNTER — Ambulatory Visit
Admission: RE | Admit: 2015-11-21 | Discharge: 2015-11-21 | Disposition: A | Discharge status: Home / Self Care | Payer: BLUE CROSS/BLUE SHIELD | Source: Ambulatory Visit

## 2015-11-21 DIAGNOSIS — Z1231 Encounter for screening mammogram for malignant neoplasm of breast: Secondary | ICD-10-CM

## 2016-01-25 ENCOUNTER — Other Ambulatory Visit: Payer: Self-pay | Admitting: Urology

## 2016-01-25 ENCOUNTER — Ambulatory Visit (HOSPITAL_COMMUNITY)
Admission: RE | Admit: 2016-01-25 | Discharge: 2016-01-25 | Disposition: A | Discharge status: Home / Self Care | Payer: BLUE CROSS/BLUE SHIELD | Source: Ambulatory Visit | Attending: Urology | Admitting: Urology

## 2016-01-25 DIAGNOSIS — I10 Essential (primary) hypertension: Secondary | ICD-10-CM | POA: Diagnosis not present

## 2016-01-25 DIAGNOSIS — C642 Malignant neoplasm of left kidney, except renal pelvis: Secondary | ICD-10-CM

## 2016-08-08 ENCOUNTER — Other Ambulatory Visit: Payer: Self-pay | Admitting: Urology

## 2016-08-08 ENCOUNTER — Ambulatory Visit (HOSPITAL_COMMUNITY)
Admission: RE | Admit: 2016-08-08 | Discharge: 2016-08-08 | Disposition: A | Discharge status: Home / Self Care | Payer: BLUE CROSS/BLUE SHIELD | Source: Ambulatory Visit | Attending: Urology | Admitting: Urology

## 2016-08-08 DIAGNOSIS — C642 Malignant neoplasm of left kidney, except renal pelvis: Secondary | ICD-10-CM

## 2017-01-14 ENCOUNTER — Other Ambulatory Visit: Payer: Self-pay | Admitting: Internal Medicine

## 2017-01-14 DIAGNOSIS — Z1231 Encounter for screening mammogram for malignant neoplasm of breast: Secondary | ICD-10-CM

## 2017-02-02 ENCOUNTER — Ambulatory Visit
Admission: RE | Admit: 2017-02-02 | Discharge: 2017-02-02 | Disposition: A | Discharge status: Home / Self Care | Payer: BLUE CROSS/BLUE SHIELD | Source: Ambulatory Visit | Attending: Internal Medicine | Admitting: Internal Medicine

## 2017-02-02 DIAGNOSIS — Z1231 Encounter for screening mammogram for malignant neoplasm of breast: Secondary | ICD-10-CM

## 2018-07-18 ENCOUNTER — Encounter (HOSPITAL_COMMUNITY): Payer: Self-pay

## 2018-07-18 ENCOUNTER — Emergency Department (HOSPITAL_COMMUNITY)
Admission: EM | Admit: 2018-07-18 | Discharge: 2018-07-18 | Disposition: A | Discharge status: Home / Self Care | Payer: BLUE CROSS/BLUE SHIELD | Attending: Emergency Medicine | Admitting: Emergency Medicine

## 2018-07-18 ENCOUNTER — Emergency Department (HOSPITAL_BASED_OUTPATIENT_CLINIC_OR_DEPARTMENT_OTHER): Payer: BLUE CROSS/BLUE SHIELD

## 2018-07-18 DIAGNOSIS — M79605 Pain in left leg: Secondary | ICD-10-CM | POA: Insufficient documentation

## 2018-07-18 DIAGNOSIS — I10 Essential (primary) hypertension: Secondary | ICD-10-CM | POA: Insufficient documentation

## 2018-07-18 DIAGNOSIS — M79609 Pain in unspecified limb: Secondary | ICD-10-CM

## 2018-07-18 DIAGNOSIS — Z79899 Other long term (current) drug therapy: Secondary | ICD-10-CM | POA: Insufficient documentation

## 2018-07-18 MED ORDER — HYDROCODONE-ACETAMINOPHEN 5-325 MG PO TABS: 2.0000 | ORAL_TABLET | ORAL | 0 refills | Status: DC | PRN

## 2018-07-18 MED ORDER — NAPROXEN 500 MG PO TABS: 500.0000 mg | ORAL_TABLET | Freq: Two times a day (BID) | ORAL | 0 refills | Status: DC

## 2018-07-18 NOTE — ED Provider Notes (Signed)
[facility] EMERGENCY DEPARTMENT Provider Note   CSN: [id number] Arrival date & time: 07/18/18  1256  History   Chief Complaint Chief Complaint  Patient presents with  . Leg Pain    HPI [name] is a 62 y.o. female past medical history significant for hypertension, CVA, kidney stones, nephrectomy who presents for evaluation of left lower extremity pain.  Patient states she noticed pain in her left calf and leg swelling for 3 days.  Was seen by urgent care this morning and was sent to the ED for ultrasound for evaluation of a DVT.  Patient states she has pain located to her posterior calf rates it a 2/10.  Pain is only present when she presses on her calf.  Does not have any rest pain.  Pain does not radiate.  Denies fever, chills, redness to bilateral extremities, warmth, chest pain, shortness of breath, abdominal pain, nausea, vomiting, back pain.  Patient [place] where she was in the car for approximately 6 hours total.  Denies recent histories, use of hormonal therapy, history of DVT or PE.  HPI  Past Medical History:  Diagnosis Date  . History of gout    x1 left foot  . Hyperlipidemia   . Hypertension   . Kidney stones    bilat   . Renal mass, left   . Stroke [facility])    pt states had "mini stroke" in 2010    Patient Active Problem List   Diagnosis Date Noted  . Renal mass 07/18/2015    Past Surgical History:  Procedure Laterality Date  . CESAREAN SECTION     times 2  . CYSTOSCOPY WITH RETROGRADE PYELOGRAM, URETEROSCOPY AND STENT PLACEMENT Bilateral 06/08/2015   Procedure: CYSTOSCOPY WITH RETROGRADE PYELOGRAM, URETEROSCOPY, STONE EXTRACTION AND STENT PLACEMENT ON THE RIGHT, DIAGNOSTIC URETEROSCOPY AND STENT PLACEMENT ON THE LEFT;  Surgeon: Alexis Frock, MD;  Location: [facility] ORS;  Service: Urology;  Laterality: Bilateral;  . HOLMIUM LASER APPLICATION Right 12/07/2681   Procedure: HOLMIUM LASER  APPLICATION;  Surgeon: Alexis Frock, MD;  Location: [facility] ORS;  Service: Urology;  Laterality: Right;  . ROBOT ASSISTED LAPAROSCOPIC NEPHRECTOMY Left 07/18/2015   Procedure: ROBOTIC ASSISTED LAPAROSCOPIC NEPHRECTOMY;  Surgeon: Alexis Frock, MD;  Location: [facility] OR[facility];  Service: Urology;  Laterality: Left;  . TUBAL LIGATION       OB History   None      Home Medications    Prior to Admission medications   Medication Sig Start Date End Date Taking? Authorizing Provider  HYDROcodone-acetaminophen (NORCO/VICODIN) 5-325 MG tablet Take 2 tablets by mouth every 4 (four) hours as needed. 07/18/18   Marsi Turvey A, PA-C  lovastatin (MEVACOR) 20 MG tablet Take 20 mg by mouth every morning.     [provider]  metoprolol tartrate (LOPRESSOR) 25 MG tablet Take 25 mg by mouth 2 (two) times daily.    [provider]  oxyCODONE-acetaminophen (ROXICET) 5-325 MG per tablet Take 1-2 tablets by mouth every 6 (six) hours as needed for moderate pain or severe pain. Post-operatively 07/18/15   Debbrah Alar, PA-C  senna-docusate (SENOKOT S) 8.6-50 MG per tablet Take 1 tablet by mouth 2 (two) times daily. While taking pain meds to prevent constipation Patient taking differently: Take 1 tablet by mouth 2 (two) times daily as needed for mild constipation.  06/08/15   Alexis Frock, MD  senna-docusate (SENOKOT-S) 8.6-50 MG per tablet Take 1 tablet by mouth  2 (two) times daily. While taking pain meds to prevent constipation. 07/20/15   Alexis Frock, MD    Family History No family history on file.  Social History Social History   Tobacco Use  . Smoking status: Never Smoker  . Smokeless tobacco: Never Used  Substance Use Topics  . Alcohol use: No  . Drug use: No     Allergies   Patient has no known allergies.   Review of Systems Review of Systems Review of systems negative unless otherwise stated in the HPI  Physical Exam Updated Vital Signs BP (!) 148/89 (BP Location: Right  Arm)   Pulse 71   Temp 98 F (36.7 C) (Oral)   Resp 18   SpO2 98%   Physical Exam  Constitutional: She appears well-developed and well-nourished. No distress.  HENT:  Head: Atraumatic.  Eyes: Pupils are equal, round, and reactive to light.  Neck: Normal range of motion.  Cardiovascular: Normal rate, regular rhythm, normal heart sounds and intact distal pulses. Exam reveals no gallop and no friction rub.  No murmur heard. Pulmonary/Chest: Effort normal and breath sounds normal. No stridor. No respiratory distress. She has no wheezes. She has no rales. She exhibits no tenderness.  Abdominal: Soft. Bowel sounds are normal. She exhibits no distension.  Musculoskeletal: Normal range of motion.  Tenderness to palpation to left calf.  No erythema, warmth.  Mild bilateral nonpitting edema to lower extremity.  Full range of motion with plantarflexion and dorsiflexion.  Intact sensation to sharp and dull to bilateral lower extremity.  Lower extremity pulses 2+.  No pallor, rash, lesions.   Neurological: She is alert.  Skin: Skin is warm and dry.  Psychiatric: She has a normal mood and affect.  Nursing note and vitals reviewed.    ED Treatments / Results  Labs (all labs ordered are listed, but only abnormal results are displayed) Labs Reviewed - No data to display  EKG None  Radiology No results found.  Procedures Procedures (including critical care time)  Medications Ordered in ED Medications - No data to display   Initial Impression / Assessment and Plan / ED Course  I have reviewed the triage vital signs and the nursing notes as well as past medical history.  Pertinent labs & imaging results that were available during my care of the patient were reviewed by me and considered in my medical decision making (see chart for details).  89- female presents for evaluation of 3-day history of left calf pain and swelling.  Patient states her swelling has decreased however presented to  urgent care this morning and was sent to the ED for an ultrasound to rule out DVT.  Tenderness palpation left posterior calf, mild bilateral nonpitting edema to bilateral lower extremities.  Given history of 6 Hour driving trip this past week will obtain ultrasound to rule out DVT as the cause of her calf pain.  Ultrasound negative for DVT.  Pain most likely due to musculoskeletal strain.  Discussed with patient use of NSAIDs however patient cannot tolerate NSAIDs or tramadol secondary to kidney issues.  We will give patient short course of Norco #5 for discomfort.  Discussed with patient need for follow-up with her primary care provider if her leg pain does not resolve.  Discussed strict return precautions with patient patient voiced understanding and seems reliable for follow-up.  All questions answered.    Final Clinical Impressions(s) / ED Diagnoses   Final diagnoses:  Left leg pain    ED Discharge  Orders         Ordered    naproxen (NAPROSYN) 500 MG tablet  2 times daily,   Status:  Discontinued     07/18/18 1615    HYDROcodone-acetaminophen (NORCO/VICODIN) 5-325 MG tablet  Every 4 hours PRN     07/18/18 1636           Kayron Hicklin A, PA-C 07/18/18 1711    Tegeler, Gwenyth Allegra, MD 07/18/18 [phone]

## 2018-07-18 NOTE — Discharge Instructions (Addendum)
You were evaluated today for left lower leg pain for your seen by urgent care and sent here for ultrasound.  I prescribed you norco for your pain since you cannot take NSAIDs or tramadol..  Please take as prescribed.  Ultrasound was negative for DVT.  Please follow-up with your primary care provider within 1 week if your symptoms not resolved.  Please return to the ED with any new or worsening symptoms such as:   Contact a health care provider if: Your pain is getting worse. Your pain is not relieved with medicines. You lose function in the area of the pain if the pain is in your arms, legs, or neck.

## 2018-07-18 NOTE — ED Triage Notes (Signed)
Pt presents for evaluation of L lower leg pain x 2-3 days. Reports was seen at [facility] and sent here for further eval of possible DVT.

## 2018-07-18 NOTE — Progress Notes (Signed)
VASCULAR LAB PRELIMINARY  PRELIMINARY  PRELIMINARY  PRELIMINARY  Left lower extremity venous duplex completed.    Preliminary report:  There is no DVT or SVT noted in the left lower extremity.  Cecylia Brazill, RVT 07/18/2018, 3:59 PM

## 2019-05-12 ENCOUNTER — Inpatient Hospital Stay (HOSPITAL_COMMUNITY)
Admission: EM | Admit: 2019-05-12 | Discharge: 2019-05-22 | DRG: 871 | Disposition: A | Discharge status: Home Health | Payer: Medicare Other | Attending: Internal Medicine | Admitting: Internal Medicine

## 2019-05-12 ENCOUNTER — Emergency Department (HOSPITAL_COMMUNITY): Payer: Medicare Other

## 2019-05-12 ENCOUNTER — Encounter (HOSPITAL_COMMUNITY): Payer: Self-pay | Admitting: Emergency Medicine

## 2019-05-12 ENCOUNTER — Other Ambulatory Visit: Payer: Self-pay

## 2019-05-12 DIAGNOSIS — Z905 Acquired absence of kidney: Secondary | ICD-10-CM | POA: Diagnosis not present

## 2019-05-12 DIAGNOSIS — N2889 Other specified disorders of kidney and ureter: Secondary | ICD-10-CM | POA: Diagnosis present

## 2019-05-12 DIAGNOSIS — E876 Hypokalemia: Secondary | ICD-10-CM | POA: Diagnosis not present

## 2019-05-12 DIAGNOSIS — Z9981 Dependence on supplemental oxygen: Secondary | ICD-10-CM

## 2019-05-12 DIAGNOSIS — D631 Anemia in chronic kidney disease: Secondary | ICD-10-CM | POA: Diagnosis present

## 2019-05-12 DIAGNOSIS — R5381 Other malaise: Secondary | ICD-10-CM

## 2019-05-12 DIAGNOSIS — R740 Nonspecific elevation of levels of transaminase and lactic acid dehydrogenase [LDH]: Secondary | ICD-10-CM | POA: Diagnosis not present

## 2019-05-12 DIAGNOSIS — A4151 Sepsis due to Escherichia coli [E. coli]: Principal | ICD-10-CM | POA: Diagnosis present

## 2019-05-12 DIAGNOSIS — J9601 Acute respiratory failure with hypoxia: Secondary | ICD-10-CM | POA: Diagnosis present

## 2019-05-12 DIAGNOSIS — K828 Other specified diseases of gallbladder: Secondary | ICD-10-CM | POA: Diagnosis present

## 2019-05-12 DIAGNOSIS — D696 Thrombocytopenia, unspecified: Secondary | ICD-10-CM | POA: Diagnosis present

## 2019-05-12 DIAGNOSIS — Z6839 Body mass index (BMI) 39.0-39.9, adult: Secondary | ICD-10-CM | POA: Diagnosis not present

## 2019-05-12 DIAGNOSIS — K819 Cholecystitis, unspecified: Secondary | ICD-10-CM | POA: Diagnosis not present

## 2019-05-12 DIAGNOSIS — D649 Anemia, unspecified: Secondary | ICD-10-CM | POA: Diagnosis not present

## 2019-05-12 DIAGNOSIS — K851 Biliary acute pancreatitis without necrosis or infection: Secondary | ICD-10-CM | POA: Diagnosis present

## 2019-05-12 DIAGNOSIS — M109 Gout, unspecified: Secondary | ICD-10-CM | POA: Diagnosis present

## 2019-05-12 DIAGNOSIS — I129 Hypertensive chronic kidney disease with stage 1 through stage 4 chronic kidney disease, or unspecified chronic kidney disease: Secondary | ICD-10-CM | POA: Diagnosis present

## 2019-05-12 DIAGNOSIS — Z7982 Long term (current) use of aspirin: Secondary | ICD-10-CM

## 2019-05-12 DIAGNOSIS — Z8673 Personal history of transient ischemic attack (TIA), and cerebral infarction without residual deficits: Secondary | ICD-10-CM

## 2019-05-12 DIAGNOSIS — R1013 Epigastric pain: Secondary | ICD-10-CM | POA: Diagnosis not present

## 2019-05-12 DIAGNOSIS — Z79899 Other long term (current) drug therapy: Secondary | ICD-10-CM

## 2019-05-12 DIAGNOSIS — N184 Chronic kidney disease, stage 4 (severe): Secondary | ICD-10-CM | POA: Diagnosis present

## 2019-05-12 DIAGNOSIS — N179 Acute kidney failure, unspecified: Secondary | ICD-10-CM | POA: Diagnosis present

## 2019-05-12 DIAGNOSIS — R6521 Severe sepsis with septic shock: Secondary | ICD-10-CM | POA: Diagnosis present

## 2019-05-12 DIAGNOSIS — K760 Fatty (change of) liver, not elsewhere classified: Secondary | ICD-10-CM | POA: Diagnosis present

## 2019-05-12 DIAGNOSIS — R7881 Bacteremia: Secondary | ICD-10-CM | POA: Diagnosis present

## 2019-05-12 DIAGNOSIS — G5690 Unspecified mononeuropathy of unspecified upper limb: Secondary | ICD-10-CM | POA: Diagnosis not present

## 2019-05-12 DIAGNOSIS — J90 Pleural effusion, not elsewhere classified: Secondary | ICD-10-CM | POA: Diagnosis present

## 2019-05-12 DIAGNOSIS — J9811 Atelectasis: Secondary | ICD-10-CM | POA: Diagnosis present

## 2019-05-12 DIAGNOSIS — R109 Unspecified abdominal pain: Secondary | ICD-10-CM

## 2019-05-12 DIAGNOSIS — B955 Unspecified streptococcus as the cause of diseases classified elsewhere: Secondary | ICD-10-CM | POA: Diagnosis present

## 2019-05-12 DIAGNOSIS — Z87442 Personal history of urinary calculi: Secondary | ICD-10-CM

## 2019-05-12 DIAGNOSIS — E669 Obesity, unspecified: Secondary | ICD-10-CM | POA: Diagnosis present

## 2019-05-12 DIAGNOSIS — E785 Hyperlipidemia, unspecified: Secondary | ICD-10-CM | POA: Diagnosis present

## 2019-05-12 DIAGNOSIS — K7200 Acute and subacute hepatic failure without coma: Secondary | ICD-10-CM | POA: Diagnosis present

## 2019-05-12 DIAGNOSIS — Z1611 Resistance to penicillins: Secondary | ICD-10-CM | POA: Diagnosis present

## 2019-05-12 DIAGNOSIS — Z8249 Family history of ischemic heart disease and other diseases of the circulatory system: Secondary | ICD-10-CM

## 2019-05-12 DIAGNOSIS — Z1159 Encounter for screening for other viral diseases: Secondary | ICD-10-CM

## 2019-05-12 DIAGNOSIS — A419 Sepsis, unspecified organism: Secondary | ICD-10-CM | POA: Diagnosis present

## 2019-05-12 DIAGNOSIS — R0600 Dyspnea, unspecified: Secondary | ICD-10-CM

## 2019-05-12 DIAGNOSIS — G629 Polyneuropathy, unspecified: Secondary | ICD-10-CM | POA: Diagnosis present

## 2019-05-12 DIAGNOSIS — I10 Essential (primary) hypertension: Secondary | ICD-10-CM | POA: Diagnosis not present

## 2019-05-12 DIAGNOSIS — E8770 Fluid overload, unspecified: Secondary | ICD-10-CM | POA: Diagnosis present

## 2019-05-12 DIAGNOSIS — K8001 Calculus of gallbladder with acute cholecystitis with obstruction: Secondary | ICD-10-CM | POA: Diagnosis present

## 2019-05-12 DIAGNOSIS — J9691 Respiratory failure, unspecified with hypoxia: Secondary | ICD-10-CM | POA: Diagnosis not present

## 2019-05-12 LAB — URINALYSIS, ROUTINE W REFLEX MICROSCOPIC
Bacteria, UA: NONE SEEN
Bilirubin Urine: NEGATIVE
Glucose, UA: NEGATIVE mg/dL
Ketones, ur: NEGATIVE mg/dL
Leukocytes,Ua: NEGATIVE
Nitrite: NEGATIVE
Protein, ur: 30 mg/dL — AB
Specific Gravity, Urine: 1.009 (ref 1.005–1.030)
pH: 6 (ref 5.0–8.0)

## 2019-05-12 LAB — CBC WITH DIFFERENTIAL/PLATELET
Abs Immature Granulocytes: 0 10*3/uL (ref 0.00–0.07)
Band Neutrophils: 5 %
Basophils Absolute: 0 10*3/uL (ref 0.0–0.1)
Basophils Relative: 0 %
Eosinophils Absolute: 0.1 10*3/uL (ref 0.0–0.5)
Eosinophils Relative: 3 %
HCT: 37.7 % (ref 36.0–46.0)
Hemoglobin: 12 g/dL (ref 12.0–15.0)
Lymphocytes Relative: 14 %
Lymphs Abs: 0.5 10*3/uL — ABNORMAL LOW (ref 0.7–4.0)
MCH: 31.9 pg (ref 26.0–34.0)
MCHC: 31.8 g/dL (ref 30.0–36.0)
MCV: 100.3 fL — ABNORMAL HIGH (ref 80.0–100.0)
Monocytes Absolute: 0 10*3/uL — ABNORMAL LOW (ref 0.1–1.0)
Monocytes Relative: 1 %
Neutro Abs: 2.9 10*3/uL (ref 1.7–7.7)
Neutrophils Relative %: 77 %
Platelets: 156 10*3/uL (ref 150–400)
RBC: 3.76 MIL/uL — ABNORMAL LOW (ref 3.87–5.11)
RDW: 14.5 % (ref 11.5–15.5)
WBC: 3.5 10*3/uL — ABNORMAL LOW (ref 4.0–10.5)
nRBC: 0.9 % — ABNORMAL HIGH (ref 0.0–0.2)
nRBC: 3 /100 WBC — ABNORMAL HIGH

## 2019-05-12 LAB — LACTIC ACID, PLASMA
Lactic Acid, Venous: 4.2 mmol/L (ref 0.5–1.9)
Lactic Acid, Venous: 4 mmol/L (ref 0.5–1.9)
Lactic Acid, Venous: 4.1 mmol/L (ref 0.5–1.9)
Lactic Acid, Venous: 3.8 mmol/L (ref 0.5–1.9)

## 2019-05-12 LAB — BLOOD CULTURE ID PANEL (REFLEXED)
Acinetobacter baumannii: NOT DETECTED
Candida albicans: NOT DETECTED
Candida glabrata: NOT DETECTED
Candida krusei: NOT DETECTED
Candida parapsilosis: NOT DETECTED
Candida tropicalis: NOT DETECTED
Carbapenem resistance: NOT DETECTED
Enterobacter cloacae complex: NOT DETECTED
Enterobacteriaceae species: DETECTED — AB
Enterococcus species: NOT DETECTED
Escherichia coli: DETECTED — AB
Haemophilus influenzae: NOT DETECTED
Klebsiella oxytoca: NOT DETECTED
Klebsiella pneumoniae: NOT DETECTED
Listeria monocytogenes: NOT DETECTED
Neisseria meningitidis: NOT DETECTED
Proteus species: NOT DETECTED
Pseudomonas aeruginosa: NOT DETECTED
Serratia marcescens: NOT DETECTED
Staphylococcus aureus (BCID): NOT DETECTED
Staphylococcus species: NOT DETECTED
Streptococcus agalactiae: NOT DETECTED
Streptococcus pneumoniae: NOT DETECTED
Streptococcus pyogenes: NOT DETECTED
Streptococcus species: DETECTED — AB

## 2019-05-12 LAB — BILIRUBIN, FRACTIONATED(TOT/DIR/INDIR)
Bilirubin, Direct: 1.8 mg/dL — ABNORMAL HIGH (ref 0.0–0.2)
Indirect Bilirubin: 0.8 mg/dL (ref 0.3–0.9)
Total Bilirubin: 2.6 mg/dL — ABNORMAL HIGH (ref 0.3–1.2)

## 2019-05-12 LAB — COMPREHENSIVE METABOLIC PANEL
ALT: 312 U/L — ABNORMAL HIGH (ref 0–44)
AST: 753 U/L — ABNORMAL HIGH (ref 15–41)
Albumin: 3.6 g/dL (ref 3.5–5.0)
Alkaline Phosphatase: 126 U/L (ref 38–126)
Anion gap: 13 (ref 5–15)
BUN: 25 mg/dL — ABNORMAL HIGH (ref 8–23)
CO2: 22 mmol/L (ref 22–32)
Calcium: 9.1 mg/dL (ref 8.9–10.3)
Chloride: 102 mmol/L (ref 98–111)
Creatinine, Ser: 2.21 mg/dL — ABNORMAL HIGH (ref 0.44–1.00)
GFR calc Af Amer: 26 mL/min — ABNORMAL LOW (ref >60)
GFR calc non Af Amer: 23 mL/min — ABNORMAL LOW (ref >60)
Glucose, Bld: 104 mg/dL — ABNORMAL HIGH (ref 70–99)
Potassium: 4 mmol/L (ref 3.5–5.1)
Sodium: 137 mmol/L (ref 135–145)
Total Bilirubin: 1.6 mg/dL — ABNORMAL HIGH (ref 0.3–1.2)
Total Protein: 6.9 g/dL (ref 6.5–8.1)

## 2019-05-12 LAB — SARS CORONAVIRUS 2 BY RT PCR (HOSPITAL ORDER, PERFORMED IN ~~LOC~~ HOSPITAL LAB): SARS Coronavirus 2: NEGATIVE

## 2019-05-12 LAB — TROPONIN I (HIGH SENSITIVITY)
Troponin I (High Sensitivity): 3 ng/L (ref <18)
Troponin I (High Sensitivity): 4 ng/L (ref <18)

## 2019-05-12 LAB — LIPASE, BLOOD: Lipase: 8025 U/L — ABNORMAL HIGH (ref 11–51)

## 2019-05-12 LAB — GAMMA GT: GGT: 381 U/L — ABNORMAL HIGH (ref 7–50)

## 2019-05-12 MED ORDER — PROMETHAZINE HCL 25 MG PO TABS: 12.5000 mg | ORAL_TABLET | Freq: Four times a day (QID) | ORAL | Status: DC | PRN

## 2019-05-12 MED ORDER — GABAPENTIN 300 MG PO CAPS
300.0000 mg | ORAL_CAPSULE | Freq: Three times a day (TID) | ORAL | Status: DC
  Administered 2019-05-12 – 2019-05-22 (×30): 300 mg via ORAL
  Filled 2019-05-12 (×30): qty 1

## 2019-05-12 MED ORDER — PIPERACILLIN-TAZOBACTAM 3.375 G IVPB
3.3750 g | Freq: Three times a day (TID) | INTRAVENOUS | Status: DC
  Administered 2019-05-13: 3.375 g via INTRAVENOUS
  Filled 2019-05-12 (×2): qty 50

## 2019-05-12 MED ORDER — FENTANYL CITRATE (PF) 100 MCG/2ML IJ SOLN
50.0000 ug | INTRAMUSCULAR | Status: AC | PRN
  Administered 2019-05-12 (×2): 50 ug via INTRAVENOUS
  Filled 2019-05-12 (×2): qty 2

## 2019-05-12 MED ORDER — ACETAMINOPHEN 650 MG RE SUPP: 650.0000 mg | Freq: Four times a day (QID) | RECTAL | Status: DC | PRN

## 2019-05-12 MED ORDER — ACETAMINOPHEN 325 MG PO TABS
650.0000 mg | ORAL_TABLET | Freq: Four times a day (QID) | ORAL | Status: DC | PRN
  Administered 2019-05-12: 16:00:00 650 mg via ORAL
  Filled 2019-05-12: qty 2

## 2019-05-12 MED ORDER — ONDANSETRON HCL 4 MG/2ML IJ SOLN
4.0000 mg | Freq: Once | INTRAMUSCULAR | Status: AC
  Administered 2019-05-12: 11:00:00 4 mg via INTRAVENOUS
  Filled 2019-05-12: qty 2

## 2019-05-12 MED ORDER — PIPERACILLIN-TAZOBACTAM 3.375 G IVPB
3.3750 g | Freq: Three times a day (TID) | INTRAVENOUS | Status: DC
  Administered 2019-05-12: 21:00:00 3.375 g via INTRAVENOUS
  Filled 2019-05-12: qty 50

## 2019-05-12 MED ORDER — SENNOSIDES-DOCUSATE SODIUM 8.6-50 MG PO TABS: 1.0000 | ORAL_TABLET | Freq: Every evening | ORAL | Status: DC | PRN

## 2019-05-12 MED ORDER — LACTATED RINGERS IV BOLUS
1000.0000 mL | Freq: Once | INTRAVENOUS | Status: AC
  Administered 2019-05-12: 21:00:00 1000 mL via INTRAVENOUS

## 2019-05-12 MED ORDER — ASPIRIN EC 81 MG PO TBEC
81.0000 mg | DELAYED_RELEASE_TABLET | Freq: Every day | ORAL | Status: DC
  Administered 2019-05-13 – 2019-05-22 (×10): 81 mg via ORAL
  Filled 2019-05-12 (×10): qty 1

## 2019-05-12 MED ORDER — VANCOMYCIN VARIABLE DOSE PER UNSTABLE RENAL FUNCTION (PHARMACIST DOSING): Status: DC

## 2019-05-12 MED ORDER — HEPARIN SODIUM (PORCINE) 5000 UNIT/ML IJ SOLN: 5000.0000 [IU] | Freq: Three times a day (TID) | INTRAMUSCULAR | Status: DC

## 2019-05-12 MED ORDER — PIPERACILLIN-TAZOBACTAM 3.375 G IVPB 30 MIN
3.3750 g | Freq: Once | INTRAVENOUS | Status: AC
  Administered 2019-05-12: 3.375 g via INTRAVENOUS
  Filled 2019-05-12: qty 50

## 2019-05-12 MED ORDER — VANCOMYCIN HCL 10 G IV SOLR
2000.0000 mg | Freq: Once | INTRAVENOUS | Status: DC
  Filled 2019-05-12: qty 2000

## 2019-05-12 MED ORDER — SODIUM CHLORIDE 0.9 % IV BOLUS
500.0000 mL | Freq: Once | INTRAVENOUS | Status: AC
  Administered 2019-05-12: 500 mL via INTRAVENOUS

## 2019-05-12 MED ORDER — SODIUM CHLORIDE 0.9 % IV BOLUS: 30.0000 mL/kg | Freq: Once | INTRAVENOUS | Status: DC

## 2019-05-12 MED ORDER — ACETAMINOPHEN 325 MG PO TABS: 650.0000 mg | ORAL_TABLET | Freq: Four times a day (QID) | ORAL | Status: DC | PRN

## 2019-05-12 MED ORDER — HEPARIN SODIUM (PORCINE) 5000 UNIT/ML IJ SOLN
5000.0000 [IU] | Freq: Three times a day (TID) | INTRAMUSCULAR | Status: DC
  Administered 2019-05-12 – 2019-05-14 (×5): 5000 [IU] via SUBCUTANEOUS
  Filled 2019-05-12 (×4): qty 1

## 2019-05-12 MED ORDER — SODIUM CHLORIDE 0.9 % IV SOLN
3.0000 g | Freq: Four times a day (QID) | INTRAVENOUS | Status: DC
  Administered 2019-05-12: 3 g via INTRAVENOUS
  Filled 2019-05-12: qty 3
  Filled 2019-05-12 (×2): qty 8

## 2019-05-12 MED ORDER — LACTATED RINGERS IV BOLUS
1000.0000 mL | Freq: Once | INTRAVENOUS | Status: AC
  Administered 2019-05-12: 1000 mL via INTRAVENOUS

## 2019-05-12 MED ORDER — PRAVASTATIN SODIUM 10 MG PO TABS: 20.0000 mg | ORAL_TABLET | Freq: Every day | ORAL | Status: DC

## 2019-05-12 MED ORDER — SODIUM CHLORIDE 0.9 % IV BOLUS
2000.0000 mL | Freq: Once | INTRAVENOUS | Status: AC
  Administered 2019-05-12: 2000 mL via INTRAVENOUS

## 2019-05-12 MED ORDER — ALLOPURINOL 300 MG PO TABS
300.0000 mg | ORAL_TABLET | Freq: Every day | ORAL | Status: DC
  Administered 2019-05-13 – 2019-05-22 (×10): 300 mg via ORAL
  Filled 2019-05-12 (×10): qty 1

## 2019-05-12 MED ORDER — HYDROMORPHONE HCL 1 MG/ML IJ SOLN: 1.0000 mg | Freq: Three times a day (TID) | INTRAMUSCULAR | Status: DC | PRN

## 2019-05-12 MED ORDER — LACTATED RINGERS IV SOLN
INTRAVENOUS | Status: AC
  Administered 2019-05-12 (×2): via INTRAVENOUS

## 2019-05-12 MED ORDER — FENTANYL CITRATE (PF) 100 MCG/2ML IJ SOLN
50.0000 ug | Freq: Once | INTRAMUSCULAR | Status: DC
  Filled 2019-05-12: qty 2

## 2019-05-12 NOTE — Progress Notes (Signed)
Pharmacy Antibiotic Note  [name] is a 63 y.o. female admitted on 05/12/2019 with abdominal pain, diagnosed as gallstone pancreatitis.  Pharmacy has been consulted for Zosyn dosing. Pt rec'd Zosyn 3.375 gm IV X 1 at 14:23 PM today.  Pt has past medical history of left-sided renal mass, S/P left nephrectomy; renal stones; hypertension; hyperlipidemia; TIA; previous abdominal surgeries include L nephrectomy and cesarean section.  Plan: Zosyn 3.375 gm IV Q 8 hrs, starting ~8 hrs after initial dose Monitor clinical progress, cultures, WBC, temperature    Temp (24hrs), Avg:98.5 F (36.9 C), Min:98.4 F (36.9 C), Max:98.6 F (37 C)  Recent Labs  Lab 05/12/19 0915 05/12/19 1300 05/12/19 1500  WBC 3.5*  --   --   CREATININE 2.21*  --   --   LATICACIDVEN  --  4.2* 4.0*    CrCl cannot be calculated (Unknown ideal weight.).    No Known Allergies  Antimicrobials this admission: 7/9 Zosyn >>    Microbiology results: 7/9 BCx X 2: pending 7/9 COVID: negative   Thank you for allowing pharmacy to be a part of this patient's care.  Gillermina Hu, PharmD, BCPS, [facility] Clinical Pharmacist 05/12/2019 6:25 PM

## 2019-05-12 NOTE — ED Triage Notes (Signed)
Pt states she woke up with intense epigastric pain that at times radiates into her chest. Pt states she vomited twice. Pt rates pain as "more than a 10".
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
## 2019-05-12 NOTE — ED Notes (Signed)
Oxygen level 89% at room air, nasal canula at 2L placed.

## 2019-05-12 NOTE — ED Notes (Signed)
ED TO INPATIENT HANDOFF REPORT  ED Nurse Name and Phone #: William Hamburger, RN [place]  S Name/Age/Gender [name] 63 y.o. female Room/Bed: 027C/027C  Code Status   Code Status: Full Code  Home/SNF/Other Home Patient oriented to: situation Is this baseline? No   Triage Complete: Triage complete  Chief Complaint abd pain  Triage Note Pt states she woke up with intense epigastric pain that at times radiates into her chest. Pt states she vomited twice. Pt rates pain as "more than a 10".   Allergies No Known Allergies  Level of Care/Admitting Diagnosis ED Disposition    ED Disposition Condition Comment   Admit  Hospital Area: [place] [[id number]]  Level of Care: Progressive [102]  Covid Evaluation: Confirmed COVID Negative  Diagnosis: Gallstone pancreatitis [[id number]]  Admitting Physician: Oda Kilts [[id number]]  Attending Physician: Oda Kilts [[id number]]  Estimated length of stay: past midnight tomorrow  Certification:: I certify this patient will need inpatient services for at least 2 midnights  PT Class (Do Not Modify): Inpatient [101]  PT Acc Code (Do Not Modify): Private [1]       B Medical/Surgery History Past Medical History:  Diagnosis Date  . History of gout    x1 left foot  . Hyperlipidemia   . Hypertension   . Kidney stones    bilat   . Renal mass, left    S/p Nephrectomy  . Single kidney   . Stroke [facility])    pt states had "mini stroke" in 2010   Past Surgical History:  Procedure Laterality Date  . CESAREAN SECTION     times 2  . CYSTOSCOPY WITH RETROGRADE PYELOGRAM, URETEROSCOPY AND STENT PLACEMENT Bilateral 06/08/2015   Procedure: CYSTOSCOPY WITH RETROGRADE PYELOGRAM, URETEROSCOPY, STONE EXTRACTION AND STENT PLACEMENT ON THE RIGHT, DIAGNOSTIC URETEROSCOPY AND STENT PLACEMENT ON THE LEFT;  Surgeon: Alexis Frock, MD;  Location: [facility];  Service: Urology;  Laterality: Bilateral;  . HOLMIUM LASER APPLICATION Right  06/03/8562   Procedure: HOLMIUM LASER APPLICATION;  Surgeon: Alexis Frock, MD;  Location: [facility];  Service: Urology;  Laterality: Right;  . ROBOT ASSISTED LAPAROSCOPIC NEPHRECTOMY Left 07/18/2015   Procedure: ROBOTIC ASSISTED LAPAROSCOPIC NEPHRECTOMY;  Surgeon: Alexis Frock, MD;  Location: [facility];  Service: Urology;  Laterality: Left;  . TUBAL LIGATION       A IV Location/Drains/Wounds Patient Lines/Drains/Airways Status   Active Line/Drains/Airways    Name:   Placement date:   Placement time:   Site:   Days:   Peripheral IV 05/12/19 Right Antecubital   05/12/19    1100    Antecubital   less than 1   Ureteral Drain/Stent Left ureter 5 Fr.   06/08/15    0850    Left ureter   1434   Ureteral Drain/Stent Right ureter 5 Fr.   06/08/15    0851    Right ureter   1434   Incision (Closed) 06/08/15 Perineum Other (Comment)   06/08/15    0904     1434   Incision (Closed) 07/18/15 Abdomen Other (Comment)   07/18/15    1111     1394   Incision - 2 Ports Abdomen 1: Left;Lower;Lateral 2: Left;Upper   07/18/15    -     1394   Incision - 2 Ports Abdomen 1: Left;Medial;Lower 2: Left;Medial;Upper   07/18/15    0909     1394          Intake/Output Last 24 hours No  intake or output data in the 24 hours ending 05/12/19 1507  Labs/Imaging Results for orders placed or performed during the hospital encounter of 05/12/19 (from the past 48 hour(s))  Comprehensive metabolic panel     Status: Abnormal   Collection Time: 05/12/19  9:15 AM  Result Value Ref Range   Sodium 137 135 - 145 mmol/L   Potassium 4.0 3.5 - 5.1 mmol/L   Chloride 102 98 - 111 mmol/L   CO2 22 22 - 32 mmol/L   Glucose, Bld 104 (H) 70 - 99 mg/dL   BUN 25 (H) 8 - 23 mg/dL   Creatinine, Ser 2.21 (H) 0.44 - 1.00 mg/dL   Calcium 9.1 8.9 - 10.3 mg/dL   Total Protein 6.9 6.5 - 8.1 g/dL   Albumin 3.6 3.5 - 5.0 g/dL   AST 753 (H) 15 - 41 U/L   ALT 312 (H) 0 - 44 U/L   Alkaline Phosphatase 126 38 - 126 U/L   Total Bilirubin 1.6 (H) 0.3 -  1.2 mg/dL   GFR calc non Af Amer 23 (L) >60 mL/min   GFR calc Af Amer 26 (L) >60 mL/min   Anion gap 13 5 - 15    Comment: Performed at [facility], [address] [place], [place] [address]  Lipase, blood     Status: Abnormal   Collection Time: 05/12/19  9:15 AM  Result Value Ref Range   Lipase 8,025 (H) 11 - 51 U/L    Comment: RESULTS CONFIRMED BY MANUAL DILUTION Performed at [facility], [place] [address], [place], [place] [address]   CBC with Differential     Status: Abnormal   Collection Time: 05/12/19  9:15 AM  Result Value Ref Range   WBC 3.5 (L) 4.0 - 10.5 K/uL   RBC 3.76 (L) 3.87 - 5.11 MIL/uL   Hemoglobin 12.0 12.0 - 15.0 g/dL   HCT 37.7 36.0 - 46.0 %   MCV 100.3 (H) 80.0 - 100.0 fL   MCH 31.9 26.0 - 34.0 pg   MCHC 31.8 30.0 - 36.0 g/dL   RDW 14.5 11.5 - 15.5 %   Platelets 156 150 - 400 K/uL   nRBC 0.9 (H) 0.0 - 0.2 %   Neutrophils Relative % 77 %   Neutro Abs 2.9 1.7 - 7.7 K/uL   Band Neutrophils 5 %   Lymphocytes Relative 14 %   Lymphs Abs 0.5 (L) 0.7 - 4.0 K/uL   Monocytes Relative 1 %   Monocytes Absolute 0.0 (L) 0.1 - 1.0 K/uL   Eosinophils Relative 3 %   Eosinophils Absolute 0.1 0.0 - 0.5 K/uL   Basophils Relative 0 %   Basophils Absolute 0.0 0.0 - 0.1 K/uL   WBC Morphology See Note     Comment: Vaculated Neutrophils   nRBC 3 (H) 0 /100 WBC   Abs Immature Granulocytes 0.00 0.00 - 0.07 K/uL    Comment: Performed at [place] [facility], [place] [address], [place], [place] [address]  Troponin I (High Sensitivity)     Status: None   Collection Time: 05/12/19  9:15 AM  Result Value Ref Range   Troponin I (High Sensitivity) 3 <18 ng/L    Comment: (NOTE) Elevated high sensitivity troponin I (hsTnI) values and significant  changes across serial measurements may suggest ACS but many other  chronic and acute conditions are known to elevate hsTnI results.  Refer to the Links section for chest pain algorithms and additional  guidance. Performed  at [facility], [place] [address] [place], [place] [address]   Urinalysis, Routine w reflex microscopic     Status: Abnormal   Collection Time: 05/12/19  9:38 AM  Result Value Ref Range   Color, Urine YELLOW YELLOW   APPearance CLEAR CLEAR   Specific Gravity, Urine 1.009 1.005 - 1.030   pH 6.0 5.0 - 8.0   Glucose, UA NEGATIVE NEGATIVE mg/dL   Hgb urine dipstick SMALL (A) NEGATIVE   Bilirubin Urine NEGATIVE NEGATIVE   Ketones, ur NEGATIVE NEGATIVE mg/dL   Protein, ur 30 (A) NEGATIVE mg/dL   Nitrite NEGATIVE NEGATIVE   Leukocytes,Ua NEGATIVE NEGATIVE   RBC / HPF 0-5 0 - 5 RBC/hpf   WBC, UA 0-5 0 - 5 WBC/hpf   Bacteria, UA NONE SEEN NONE SEEN    Comment: Performed at [place] [address], [place], [place] [address]  Troponin I (High Sensitivity)     Status: None   Collection Time: 05/12/19 11:16 AM  Result Value Ref Range   Troponin I (High Sensitivity) 4 <18 ng/L    Comment: (NOTE) Elevated high sensitivity troponin I (hsTnI) values and significant  changes across serial measurements may suggest ACS but many other  chronic and acute conditions are known to elevate hsTnI results.  Refer to the "Links" section for chest pain algorithms and additional  guidance. Performed at [facility], [place] [address] [place], [place] [address]   SARS Coronavirus 2 (CEPHEID - Performed in [place] hospital lab), Hosp Order     Status: None   Collection Time: 05/12/19 11:38 AM   Specimen: Nasopharyngeal Swab  Result Value Ref Range   SARS Coronavirus 2 NEGATIVE NEGATIVE    Comment: (NOTE) If result is NEGATIVE SARS-CoV-2 target nucleic acids are NOT DETECTED. The SARS-CoV-2 RNA is generally detectable in upper and lower  respiratory specimens during the acute phase of infection. The lowest  concentration of SARS-CoV-2 viral copies this assay can detect is 250  copies / mL. A negative result does not preclude SARS-CoV-2 infection  and should not be used as the sole basis  for treatment or other  patient management decisions.  A negative result may occur with  improper specimen collection / handling, submission of specimen other  than nasopharyngeal swab, presence of viral mutation(s) within the  areas targeted by this assay, and inadequate number of viral copies  (<250 copies / mL). A negative result must be combined with clinical  observations, patient history, and epidemiological information. If result is POSITIVE SARS-CoV-2 target nucleic acids are DETECTED. The SARS-CoV-2 RNA is generally detectable in upper and lower  respiratory specimens dur ing the acute phase of infection.  Positive  results are indicative of active infection with SARS-CoV-2.  Clinical  correlation with patient history and other diagnostic information is  necessary to determine patient infection status.  Positive results do  not rule out bacterial infection or co-infection with other viruses. If result is PRESUMPTIVE POSTIVE SARS-CoV-2 nucleic acids MAY BE PRESENT.   A presumptive positive result was obtained on the submitted specimen  and confirmed on repeat testing.  While 2019 novel coronavirus  (SARS-CoV-2) nucleic acids may be present in the submitted sample  additional confirmatory testing may be necessary for epidemiological  and / or clinical management purposes  to differentiate between  SARS-CoV-2 and other Sarbecovirus currently known to infect humans.  If clinically indicated additional testing with an alternate test  methodology [phone]) is advised.  The SARS-CoV-2 RNA is generally  detectable in upper and lower respiratory sp ecimens during the acute  phase of infection. The expected result is Negative. Fact Sheet for Patients:  StrictlyIdeas.no Fact Sheet for Healthcare Providers: BankingDealers.co.za This test is not yet approved or cleared by the [place] FDA and has been authorized for detection and/or  diagnosis of SARS-CoV-2 by FDA under an Emergency Use Authorization (EUA).  This EUA will remain in effect (meaning this test can be used) for the duration of the COVID-19 declaration under Section 564(b)(1) of the Act, 21 U.S.C. section 360bbb-3(b)(1), unless the authorization is terminated or revoked sooner. Performed at [facility], [place] [address], [place], [place] [address]   Lactic acid, plasma     Status: Abnormal   Collection Time: 05/12/19  1:00 PM  Result Value Ref Range   Lactic Acid, Venous 4.2 (HH) 0.5 - 1.9 mmol/L    Comment: CRITICAL RESULT CALLED TO, READ BACK BY AND VERIFIED WITH: E.BUNKS,RN 1353 05/12/2019 CLARK,S Performed at [facility], [place] [address], [place], [place] [address]    Dg Chest Port 1 View  Result Date: 05/12/2019 CLINICAL DATA:  Chest and epigastric region pain.  Hypertension. EXAM: PORTABLE CHEST 1 VIEW COMPARISON:  August 08, 2016 FINDINGS: There is no edema or consolidation. Heart is slightly enlarged with pulmonary vascularity normal. No adenopathy. No bone lesions. There are surgical clips in the left neck region. IMPRESSION: Stable cardiac prominence.  No edema or consolidation. Electronically Signed   By: Lowella Grip III M.D.   On: 05/12/2019 09:30   US Abdomen Limited Ruq  Result Date: 05/12/2019 CLINICAL DATA:  63 year old female with acute abdominal pain for 1 day. EXAM: ULTRASOUND ABDOMEN LIMITED RIGHT UPPER QUADRANT COMPARISON:  None. FINDINGS: Gallbladder: Gallbladder wall thickening, pericholecystic fluid and gallbladder sludge noted. No definite sonographic Percell Miller sign is identified. Common bile duct: Diameter: 5 mm.  No intrahepatic or extrahepatic biliary dilatation. Liver: Mild diffuse increase in echogenicity noted. Two separate focal hypoechoic areas within the liver are identified measuring 2.6 x 1.8 x 2.8 cm and 1.9 x 1.1 x 2.6 cm. The portal vein is patent on color Doppler imaging with normal direction of blood flow  towards the liver. IMPRESSION: 1. Gallbladder sludge with gallbladder wall thickening and pericholecystic fluid without definite sonographic Murphy sign, which may represent acute cholecystitis. No biliary dilatation. 2. Hepatic steatosis. Two separate focal hypoechoic areas within the liver are indeterminate but may represent focal fatty sparing. Consider elective MRI of the abdomen with and without contrast or short-term ultrasound follow-up. Electronically Signed   By: Margarette Canada M.D.   On: 05/12/2019 12:42    Pending Labs Unresulted Labs (From admission, onward)    Start     Ordered   05/13/19 0500  CBC  Tomorrow morning,   R     05/12/19 1433   05/13/19 0500  Comprehensive metabolic panel  Tomorrow morning,   R     05/12/19 1451   05/13/19 0500  CBC  Tomorrow morning,   R     05/12/19 1451   05/12/19 1430  HIV antibody (Routine Testing)  Once,   STAT     05/12/19 1433   05/12/19 1140  Blood culture (routine x 2)  BLOOD CULTURE X 2,   STAT     05/12/19 1139   05/12/19 1139  Lactic acid, plasma  Now then every 2 hours,   STAT     05/12/19 1138  Vitals/Pain Today's Vitals   05/12/19 1334 05/12/19 1400 05/12/19 1415 05/12/19 1448  BP: 105/72 109/71 98/68 92/67   Pulse: 99 (!) 104 (!) 104 (!) 107  Resp: (!) 23 20 (!) 25 (!) 31  Temp:      TempSrc:      SpO2: 96% 100% 99% 97%  PainSc:        Isolation Precautions No active isolations  Medications Medications  fentaNYL (SUBLIMAZE) injection 50 mcg (has no administration in time range)  heparin injection 5,000 Units (has no administration in time range)  acetaminophen (TYLENOL) tablet 650 mg (has no administration in time range)    Or  acetaminophen (TYLENOL) suppository 650 mg (has no administration in time range)  senna-docusate (Senokot-S) tablet 1 tablet (has no administration in time range)  promethazine (PHENERGAN) tablet 12.5 mg (has no administration in time range)  lactated ringers bolus 1,000 mL (has no  administration in time range)    Followed by  lactated ringers infusion (has no administration in time range)  fentaNYL (SUBLIMAZE) injection 50 mcg (50 mcg Intravenous Given 05/12/19 1200)  ondansetron (ZOFRAN) injection 4 mg (4 mg Intravenous Given 05/12/19 1030)  sodium chloride 0.9 % bolus 500 mL (500 mLs Intravenous New Bag/Given 05/12/19 1416)  piperacillin-tazobactam (ZOSYN) IVPB 3.375 g (0 g Intravenous Stopped 05/12/19 1450)  sodium chloride 0.9 % bolus 2,000 mL (2,000 mLs Intravenous New Bag/Given 05/12/19 1417)    Mobility walks with person assist Low fall risk   Focused Assessments Neuro Assessment Handoff:  Swallow screen pass? Yes  Cardiac Rhythm: Sinus tachycardia       Neuro Assessment:   Neuro Checks:      Last Documented NIHSS Modified Score:   Has TPA been given? No If patient is a Neuro Trauma and patient is going to OR before floor call report to [facility] nurse: [phone] or [phone]  , Pulmonary Assessment Handoff:  Lung sounds:   O2 Device: Nasal Cannula O2 Flow Rate (L/min): 2 L/min      R Recommendations: See Admitting Provider Note  Report given to:   Additional Notes:

## 2019-05-12 NOTE — ED Provider Notes (Signed)
[facility] [place] [facility] EMERGENCY DEPARTMENT Provider Note   CSN: [id number] Arrival date & time: 05/12/19  0901    History   Chief Complaint Chief Complaint  Patient presents with  . Abdominal Pain    HPI [name] is a 63 y.o. 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  She reports that her pain started at approximately 730.  She had awoken, taken her medicines and had a few sips of coffee before the pain started.  She states that she usually drinks coffee and has never had any reactions like this before.  She denies any history of heartburn.  She denies any recent trauma or shortness of breath.  The pain is in her epigastric abdomen radiating into her chest occasionally.  She states that that area generally feels full.  Last bowel movement was this morning prior to the onset of her pain which was normal for her.  She reports nausea and that she has vomited "phlegm" however reports continued nausea.  She has never had abdominal pain like this before.       HPI  Past Medical History:  Diagnosis Date  . History of gout    x1 left foot  . Hyperlipidemia   . Hypertension   . Kidney stones    bilat   . Renal mass, left    S/p Nephrectomy  . Single kidney   . Stroke [facility])    pt states had "mini stroke" in 2010    Patient Active Problem List   Diagnosis Date Noted  . Gallstone pancreatitis 05/12/2019  . Renal mass 07/18/2015    Past Surgical History:  Procedure Laterality Date  . CESAREAN SECTION     times 2  . CYSTOSCOPY WITH RETROGRADE PYELOGRAM, URETEROSCOPY AND STENT PLACEMENT Bilateral 06/08/2015   Procedure: CYSTOSCOPY WITH RETROGRADE PYELOGRAM, URETEROSCOPY, STONE EXTRACTION AND STENT PLACEMENT ON THE RIGHT, DIAGNOSTIC  URETEROSCOPY AND STENT PLACEMENT ON THE LEFT;  Surgeon: Alexis Frock, MD;  Location: [facility];  Service: Urology;  Laterality: Bilateral;  . HOLMIUM LASER APPLICATION Right 8/0/8811   Procedure: HOLMIUM LASER APPLICATION;  Surgeon: Alexis Frock, MD;  Location: [facility];  Service: Urology;  Laterality: Right;  . ROBOT ASSISTED LAPAROSCOPIC NEPHRECTOMY Left 07/18/2015   Procedure: ROBOTIC ASSISTED LAPAROSCOPIC NEPHRECTOMY;  Surgeon: Alexis Frock, MD;  Location: [facility];  Service: Urology;  Laterality: Left;  . TUBAL LIGATION       OB History   No obstetric history on file.      Home Medications    Prior to Admission medications   Medication Sig Start Date End Date Taking? Authorizing Provider  allopurinol (ZYLOPRIM) 300 MG tablet Take 300 mg by mouth daily. 05/11/19  Yes [provider]  aspirin EC 81 MG tablet Take 81 mg by mouth daily.   Yes [provider]  Cholecalciferol (VITAMIN D3 PO) Take 5,000 mg by mouth daily.   Yes [provider]  Cranberry 500 MG CAPS Take 500 mg by mouth daily.   Yes [provider]  gabapentin (NEURONTIN) 300 MG capsule Take 300 mg by mouth 3 (three) times daily. 04/21/19  Yes [provider]  losartan (COZAAR) 50 MG tablet Take 50 mg by mouth daily. 02/13/19  Yes [provider]  lovastatin (MEVACOR) 20 MG tablet Take 20 mg by mouth every  morning.    Yes [provider]  metoprolol tartrate (LOPRESSOR) 25 MG tablet Take 25 mg by mouth 2 (two) times daily.   Yes [provider]  senna-docusate (SENOKOT S) 8.6-50 MG per tablet Take 1 tablet by mouth 2 (two) times daily. While taking pain meds to prevent constipation Patient taking differently: Take 1 tablet by mouth 2 (two) times daily as needed for mild constipation.  06/08/15  Yes Alexis Frock, MD  HYDROcodone-acetaminophen (NORCO/VICODIN) 5-325 MG tablet Take 2 tablets by mouth every 4 (four) hours as needed. Patient not taking: Reported  on 05/12/2019 07/18/18   Henderly, Britni A, PA-C  oxyCODONE-acetaminophen (ROXICET) 5-325 MG per tablet Take 1-2 tablets by mouth every 6 (six) hours as needed for moderate pain or severe pain. Post-operatively Patient not taking: Reported on 05/12/2019 07/18/15   Debbrah Alar, PA-C  senna-docusate (SENOKOT-S) 8.6-50 MG per tablet Take 1 tablet by mouth 2 (two) times daily. While taking pain meds to prevent constipation. Patient not taking: Reported on 05/12/2019 07/20/15   Alexis Frock, MD    Family History Family History  Problem Relation Age of Onset  . Hypertension Mother   . Gout Mother   . Pancreatitis Sister     Social History Social History   Tobacco Use  . Smoking status: Never Smoker  . Smokeless tobacco: Never Used  Substance Use Topics  . Alcohol use: Not on file  . Drug use: Not on file     Allergies   Patient has no known allergies.   Review of Systems Review of Systems  Constitutional: Negative for chills and fever.  HENT: Negative for congestion.   Eyes: Negative for visual disturbance.  Respiratory: Negative for cough, chest tightness, shortness of breath and wheezing.   Cardiovascular: Positive for chest pain. Negative for palpitations and leg swelling.  Gastrointestinal: Positive for abdominal distention, abdominal pain, nausea and vomiting. Negative for constipation and diarrhea.  Genitourinary: Negative for dysuria, frequency and urgency.  Musculoskeletal: Negative for back pain and neck pain.  Neurological: Negative for weakness and headaches.  Psychiatric/Behavioral: Negative for confusion.  All other systems reviewed and are negative.    Physical Exam Updated Vital Signs BP 95/65   Pulse (!) 105   Temp 98.5 F (36.9 C) (Oral)   Resp (!) 25   SpO2 96%   Physical Exam Vitals signs and nursing note reviewed.  Constitutional:      Appearance: She is well-developed.     Comments: Appears uncomfortable  HENT:     Head: Normocephalic and  atraumatic.  Eyes:     General: No scleral icterus.       Right eye: No discharge.        Left eye: No discharge.     Conjunctiva/sclera: Conjunctivae normal.  Neck:     Musculoskeletal: Normal range of motion.  Cardiovascular:     Rate and Rhythm: Normal rate and regular rhythm.     Heart sounds: Normal heart sounds. No murmur.  Pulmonary:     Effort: Pulmonary effort is normal. No respiratory distress.     Breath sounds: Normal breath sounds. No stridor.  Abdominal:     General: A surgical scar is present. Bowel sounds are decreased. There is distension (Mild, diffuse).     Palpations: Abdomen is soft.     Tenderness: There is abdominal tenderness (Generalized tenderness to palpation, primarily worse in the epigastric, periumbilical, and bilateral upper quadrants.). There is no guarding or rebound.     Hernia:  No hernia is present.  Musculoskeletal:        General: No deformity.  Skin:    General: Skin is warm and dry.  Neurological:     General: No focal deficit present.     Mental Status: She is alert and oriented to person, place, and time.     Motor: No abnormal muscle tone.  Psychiatric:        Mood and Affect: Mood normal.        Behavior: Behavior normal.      ED Treatments / Results  Labs (all labs ordered are listed, but only abnormal results are displayed) Labs Reviewed  COMPREHENSIVE METABOLIC PANEL - Abnormal; Notable for the following components:      Result Value   Glucose, Bld 104 (*)    BUN 25 (*)    Creatinine, Ser 2.21 (*)    AST 753 (*)    ALT 312 (*)    Total Bilirubin 1.6 (*)    GFR calc non Af Amer 23 (*)    GFR calc Af Amer 26 (*)    All other components within normal limits  LIPASE, BLOOD - Abnormal; Notable for the following components:   Lipase 8,025 (*)    All other components within normal limits  CBC WITH DIFFERENTIAL/PLATELET - Abnormal; Notable for the following components:   WBC 3.5 (*)    RBC 3.76 (*)    MCV 100.3 (*)    nRBC  0.9 (*)    Lymphs Abs 0.5 (*)    Monocytes Absolute 0.0 (*)    nRBC 3 (*)    All other components within normal limits  URINALYSIS, ROUTINE W REFLEX MICROSCOPIC - Abnormal; Notable for the following components:   Hgb urine dipstick SMALL (*)    Protein, ur 30 (*)    All other components within normal limits  LACTIC ACID, PLASMA - Abnormal; Notable for the following components:   Lactic Acid, Venous 4.2 (*)    All other components within normal limits  SARS CORONAVIRUS 2 (HOSPITAL ORDER, [place] [facility])  CULTURE, BLOOD (ROUTINE X 2)  CULTURE, BLOOD (ROUTINE X 2)  TROPONIN I (HIGH SENSITIVITY)  TROPONIN I (HIGH SENSITIVITY)  LACTIC ACID, PLASMA  HIV ANTIBODY (ROUTINE TESTING W REFLEX)    EKG EKG Interpretation  Date/Time:  Thursday May 12 2019 09:10:07 EDT Ventricular Rate:  60 PR Interval:    QRS Duration: 80 QT Interval:  423 QTC Calculation: 423 R Axis:   14 Text Interpretation:  Sinus rhythm Low voltage, precordial leads When compared to prior, t wave now upright in lead 3.  NO STEMI Confirmed by Antony Blackbird [id number]) on 05/12/2019 9:13:39 AM   Radiology Dg Chest Port 1 View  Result Date: 05/12/2019 CLINICAL DATA:  Chest and epigastric region pain.  Hypertension. EXAM: PORTABLE CHEST 1 VIEW COMPARISON:  August 08, 2016 FINDINGS: There is no edema or consolidation. Heart is slightly enlarged with pulmonary vascularity normal. No adenopathy. No bone lesions. There are surgical clips in the left neck region. IMPRESSION: Stable cardiac prominence.  No edema or consolidation. Electronically Signed   By: Lowella Grip III M.D.   On: 05/12/2019 09:30   US Abdomen Limited Ruq  Result Date: 05/12/2019 CLINICAL DATA:  63 year old female with acute abdominal pain for 1 day. EXAM: ULTRASOUND ABDOMEN LIMITED RIGHT UPPER QUADRANT COMPARISON:  None. FINDINGS: Gallbladder: Gallbladder wall thickening, pericholecystic fluid and gallbladder sludge noted. No definite  sonographic Percell Miller sign is identified. Common bile  duct: Diameter: 5 mm.  No intrahepatic or extrahepatic biliary dilatation. Liver: Mild diffuse increase in echogenicity noted. Two separate focal hypoechoic areas within the liver are identified measuring 2.6 x 1.8 x 2.8 cm and 1.9 x 1.1 x 2.6 cm. The portal vein is patent on color Doppler imaging with normal direction of blood flow towards the liver. IMPRESSION: 1. Gallbladder sludge with gallbladder wall thickening and pericholecystic fluid without definite sonographic Murphy sign, which may represent acute cholecystitis. No biliary dilatation. 2. Hepatic steatosis. Two separate focal hypoechoic areas within the liver are indeterminate but may represent focal fatty sparing. Consider elective MRI of the abdomen with and without contrast or short-term ultrasound follow-up. Electronically Signed   By: Margarette Canada M.D.   On: 05/12/2019 12:42    Procedures .Critical Care Performed by: Lorin Glass, PA-C Authorized by: Lorin Glass, PA-C   Critical care provider statement:    Critical care time (minutes):  45   Critical care was necessary to treat or prevent imminent or life-threatening deterioration of the following conditions:  Sepsis   Critical care was time spent personally by me on the following activities:  Discussions with consultants, evaluation of patient's response to treatment, examination of patient, ordering and performing treatments and interventions, ordering and review of laboratory studies, ordering and review of radiographic studies, pulse oximetry, re-evaluation of patient's condition, obtaining history from patient or surrogate and review of old charts   (including critical care time)  Medications Ordered in ED Medications  fentaNYL (SUBLIMAZE) injection 50 mcg (50 mcg Intravenous Given 05/12/19 1200)  ondansetron (ZOFRAN) injection 4 mg (4 mg Intravenous Given 05/12/19 1030)  sodium chloride 0.9 % bolus 500 mL (500 mLs  Intravenous New Bag/Given 05/12/19 1416)  piperacillin-tazobactam (ZOSYN) IVPB 3.375 g (0 g Intravenous Stopped 05/12/19 1450)  sodium chloride 0.9 % bolus 2,000 mL (2,000 mLs Intravenous New Bag/Given 05/12/19 1417)     Initial Impression / Assessment and Plan / ED Course  I have reviewed the triage vital signs and the nursing notes.  Pertinent labs & imaging results that were available during my care of the patient were reviewed by me and considered in my medical decision making (see chart for details).  Clinical Course as of May 11 1536  Thu May 12, 2019  1321 Spoke with [place] PA who will come see patient.    [EH]  [place] Janett Billow states that she appears to have gallstone pancreatitis, request medicine admission.   [EH]  1400 Extra fluid ordered to make 30/kg.  Lactic Acid, Venous(!!): 4.2 [EH]    Clinical Course User Index [EH] Lorin Glass, PA-C      Patient presents today for evaluation of epigastric/upper abdominal pain radiating into her chest that started at about 730 this morning and has been gradually worsening.  On initial exam she appears uncomfortable.  She has tenderness in the upper abdomen, her abdomen is mildly distended with decreased bowel sounds diffusely.  DDX includes hepatobiliary pathology such as cholecystitis, choledocholithiasis, pancreatitis, gastritis/GERD, small bowel obstruction, MI.  Labs were obtained and reviewed, he has mild leukopenia with a white count of 3.5.  She is not significantly anemic.  She has a significant new transaminitis with AST of 753, ALT of 312 with total bilirubin of 1.6.  Her creatinine is consistent with her baseline.  Troponin is not elevated, coronavirus test is negative.  She was tachycardic and tachypneic while in the department with leukopenia suspect sepsis.  Lactic acid came  back elevated at 4.2.  She was given 30/kg fluid bolus and started on Zosyn.  Her pain was treated with fentanyl while in the  emergency room.  Lipase came back elevated at 8025.  Dorsum right upper quadrant was obtained showing concern for cholecystitis with gallbladder sludge and wall thickening along with pericholecystic fluid.  I spoke with general surgery who agreed to see the patient, requested medicine admission for gallstone pancreatitis.  I spoke with [facility] who agreed to see patient for admission.    Final Clinical Impressions(s) / ED Diagnoses   Final diagnoses:  Abdominal pain  Gall stone pancreatitis  Sepsis without acute organ dysfunction, due to unspecified organism [facility])    ED Discharge Orders    None       Lorin Glass, [place] 05/12/19 1538    Tegeler, Gwenyth Allegra, MD 05/13/19 [phone]

## 2019-05-12 NOTE — Consult Note (Signed)
[facility] Surgery Consult/Admission Note  [name] Oct 17, 1956  [id number].    Requesting Provider: Wyn Quaker, PA-C Chief Complaint/Reason for Consult: gallstone pancreatitis   HPI:   Pt is a 63 yo female with a hx of TIA, obesity, s/p L nephrectomy, HTN, HLD, Gout who presented to the ED this am with abdominal pain. Pt states she had sudden onset epigastric/RUQ abdominal pain that progressively worsened to constant, severe, radiating into her back with associated nausea and nonbillious vomiting. No other associated symptoms. Pt denies changes in bowels, dysuria, CP, SOB, fever or chills. She was tested neg about a month ago for COVID.   Workup showed lipase of 8025, Lactic acid of 4.2, AST 753, ALT 312, Tbili 1.6, creatinine 2.21, WBC 3.5 US showed sludge with gallbladder wall thickening, pericholecystic fluid, no sonographic Murphy sign.   ROS:  Review of Systems  Constitutional: Negative for chills, diaphoresis and fever.  HENT: Negative for sore throat.   Respiratory: Negative for cough and shortness of breath.   Cardiovascular: Negative for chest pain.  Gastrointestinal: Positive for abdominal pain, nausea and vomiting. Negative for blood in stool, constipation, diarrhea and melena.  Genitourinary: Negative for dysuria and frequency.  Skin: Negative for rash.  Neurological: Negative for dizziness and loss of consciousness.  All other systems reviewed and are negative.    No family history on file.  Past Medical History:  Diagnosis Date  . History of gout    x1 left foot  . Hyperlipidemia   . Hypertension   . Kidney stones    bilat   . Renal mass, left    S/p Nephrectomy  . Single kidney   . Stroke [facility])    pt states had "mini stroke" in 2010    Past Surgical History:  Procedure Laterality Date  . CESAREAN SECTION     times 2  . CYSTOSCOPY WITH RETROGRADE PYELOGRAM, URETEROSCOPY AND STENT PLACEMENT Bilateral 06/08/2015   Procedure: CYSTOSCOPY  WITH RETROGRADE PYELOGRAM, URETEROSCOPY, STONE EXTRACTION AND STENT PLACEMENT ON THE RIGHT, DIAGNOSTIC URETEROSCOPY AND STENT PLACEMENT ON THE LEFT;  Surgeon: Alexis Frock, MD;  Location: [facility];  Service: Urology;  Laterality: Bilateral;  . HOLMIUM LASER APPLICATION Right 12/09/7122   Procedure: HOLMIUM LASER APPLICATION;  Surgeon: Alexis Frock, MD;  Location: [facility];  Service: Urology;  Laterality: Right;  . ROBOT ASSISTED LAPAROSCOPIC NEPHRECTOMY Left 07/18/2015   Procedure: ROBOTIC ASSISTED LAPAROSCOPIC NEPHRECTOMY;  Surgeon: Alexis Frock, MD;  Location: [facility];  Service: Urology;  Laterality: Left;  . TUBAL LIGATION      Social History:  reports that she has never smoked. She has never used smokeless tobacco. She reports that she does not drink alcohol or use drugs.  Allergies: No Known Allergies  (Not in a hospital admission)   Blood pressure 109/71, pulse (!) 104, temperature 98.5 F (36.9 C), temperature source Oral, resp. rate 20, SpO2 100 %.  Physical Exam Constitutional:      General: She is not in acute distress.    Appearance: Normal appearance. She is obese. She is not diaphoretic.  HENT:     Head: Normocephalic and atraumatic.     Nose: Nose normal.     Mouth/Throat:     Comments: Pt wearing mask Eyes:     General: No scleral icterus.       Right eye: No discharge.        Left eye: No discharge.     Conjunctiva/sclera: Conjunctivae normal.     Pupils: Pupils  are equal, round, and reactive to light.  Neck:     Musculoskeletal: Normal range of motion and neck supple.  Cardiovascular:     Rate and Rhythm: Regular rhythm. Tachycardia present.     Pulses:          Radial pulses are 2+ on the right side and 2+ on the left side.     Heart sounds: Normal heart sounds. No murmur.  Pulmonary:     Effort: Pulmonary effort is normal. No respiratory distress.     Breath sounds: Normal breath sounds. No wheezing, rhonchi or rales.  Abdominal:     General: Bowel sounds  are normal. There is no distension.     Palpations: Abdomen is soft. Abdomen is not rigid. There is no hepatomegaly or splenomegaly.     Tenderness: There is abdominal tenderness in the right upper quadrant and epigastric area. There is no guarding.       Comments: Well healed midline scar   Musculoskeletal: Normal range of motion.        General: No tenderness or deformity.     Right lower leg: No edema.     Left lower leg: No edema.  Skin:    General: Skin is warm and dry.     Findings: No rash.  Neurological:     Mental Status: She is alert and oriented to person, place, and time.  Psychiatric:        Mood and Affect: Mood normal.        Behavior: Behavior normal.     Results for orders placed or performed during the hospital encounter of 05/12/19 (from the past 48 hour(s))  Comprehensive metabolic panel     Status: Abnormal   Collection Time: 05/12/19  9:15 AM  Result Value Ref Range   Sodium 137 135 - 145 mmol/L   Potassium 4.0 3.5 - 5.1 mmol/L   Chloride 102 98 - 111 mmol/L   CO2 22 22 - 32 mmol/L   Glucose, Bld 104 (H) 70 - 99 mg/dL   BUN 25 (H) 8 - 23 mg/dL   Creatinine, Ser 2.21 (H) 0.44 - 1.00 mg/dL   Calcium 9.1 8.9 - 10.3 mg/dL   Total Protein 6.9 6.5 - 8.1 g/dL   Albumin 3.6 3.5 - 5.0 g/dL   AST 753 (H) 15 - 41 U/L   ALT 312 (H) 0 - 44 U/L   Alkaline Phosphatase 126 38 - 126 U/L   Total Bilirubin 1.6 (H) 0.3 - 1.2 mg/dL   GFR calc non Af Amer 23 (L) >60 mL/min   GFR calc Af Amer 26 (L) >60 mL/min   Anion gap 13 5 - 15    Comment: Performed at [facility], [address], [place], [place] [address]  Lipase, blood     Status: Abnormal   Collection Time: 05/12/19  9:15 AM  Result Value Ref Range   Lipase 8,025 (H) 11 - 51 U/L    Comment: RESULTS CONFIRMED BY MANUAL DILUTION Performed at [facility], [place] [address] [place], [place] [address]   CBC with Differential     Status: Abnormal   Collection Time: 05/12/19  9:15 AM  Result Value Ref  Range   WBC 3.5 (L) 4.0 - 10.5 K/uL   RBC 3.76 (L) 3.87 - 5.11 MIL/uL   Hemoglobin 12.0 12.0 - 15.0 g/dL   HCT 37.7 36.0 - 46.0 %   MCV 100.3 (H) 80.0 - 100.0 fL  MCH 31.9 26.0 - 34.0 pg   MCHC 31.8 30.0 - 36.0 g/dL   RDW 14.5 11.5 - 15.5 %   Platelets 156 150 - 400 K/uL   nRBC 0.9 (H) 0.0 - 0.2 %   Neutrophils Relative % 77 %   Neutro Abs 2.9 1.7 - 7.7 K/uL   Band Neutrophils 5 %   Lymphocytes Relative 14 %   Lymphs Abs 0.5 (L) 0.7 - 4.0 K/uL   Monocytes Relative 1 %   Monocytes Absolute 0.0 (L) 0.1 - 1.0 K/uL   Eosinophils Relative 3 %   Eosinophils Absolute 0.1 0.0 - 0.5 K/uL   Basophils Relative 0 %   Basophils Absolute 0.0 0.0 - 0.1 K/uL   WBC Morphology See Note     Comment: Vaculated Neutrophils   nRBC 3 (H) 0 /100 WBC   Abs Immature Granulocytes 0.00 0.00 - 0.07 K/uL    Comment: Performed at [facility], [place] [address] [place], [place] [address]  Troponin I (High Sensitivity)     Status: None   Collection Time: 05/12/19  9:15 AM  Result Value Ref Range   Troponin I (High Sensitivity) 3 <18 ng/L    Comment: (NOTE) Elevated high sensitivity troponin I (hsTnI) values and significant  changes across serial measurements may suggest ACS but many other  chronic and acute conditions are known to elevate hsTnI results.  Refer to the Links section for chest pain algorithms and additional  guidance. Performed at [facility], [place] [address] [place], [place] [address]   Urinalysis, Routine w reflex microscopic     Status: Abnormal   Collection Time: 05/12/19  9:38 AM  Result Value Ref Range   Color, Urine YELLOW YELLOW   APPearance CLEAR CLEAR   Specific Gravity, Urine 1.009 1.005 - 1.030   pH 6.0 5.0 - 8.0   Glucose, UA NEGATIVE NEGATIVE mg/dL   Hgb urine dipstick SMALL (A) NEGATIVE   Bilirubin Urine NEGATIVE NEGATIVE   Ketones, ur NEGATIVE NEGATIVE mg/dL   Protein, ur 30 (A) NEGATIVE mg/dL   Nitrite NEGATIVE NEGATIVE   Leukocytes,Ua NEGATIVE NEGATIVE    RBC / HPF 0-5 0 - 5 RBC/hpf   WBC, UA 0-5 0 - 5 WBC/hpf   Bacteria, UA NONE SEEN NONE SEEN    Comment: Performed at [place] [address], [place], [place] [address]  Troponin I (High Sensitivity)     Status: None   Collection Time: 05/12/19 11:16 AM  Result Value Ref Range   Troponin I (High Sensitivity) 4 <18 ng/L    Comment: (NOTE) Elevated high sensitivity troponin I (hsTnI) values and significant  changes across serial measurements may suggest ACS but many other  chronic and acute conditions are known to elevate hsTnI results.  Refer to the "Links" section for chest pain algorithms and additional  guidance. Performed at [facility], [place] [address] [place], [place] [address]   Lactic acid, plasma     Status: Abnormal   Collection Time: 05/12/19  1:00 PM  Result Value Ref Range   Lactic Acid, Venous 4.2 (HH) 0.5 - 1.9 mmol/L    Comment: CRITICAL RESULT CALLED TO, READ BACK BY AND VERIFIED WITH: E.BUNKS,RN [id number] 05/12/2019 CLARK,S Performed at [facility], [place] [address] [place], [place] [address]    Dg Chest Port 1 View  Result Date: 05/12/2019 CLINICAL DATA:  Chest and epigastric region pain.  Hypertension. EXAM: PORTABLE CHEST 1 VIEW COMPARISON:  August 08, 2016 FINDINGS: There is no edema or consolidation. Heart is slightly enlarged with pulmonary vascularity normal. No adenopathy. No bone lesions. There are surgical clips in the left neck region. IMPRESSION: Stable cardiac prominence.  No edema or consolidation. Electronically Signed   By: Lowella Grip III M.D.   On: 05/12/2019 09:30   US Abdomen Limited Ruq  Result Date: 05/12/2019 CLINICAL DATA:  63 year old female with acute abdominal pain for 1 day. EXAM: ULTRASOUND ABDOMEN LIMITED RIGHT UPPER QUADRANT COMPARISON:  None. FINDINGS: Gallbladder: Gallbladder wall thickening, pericholecystic fluid and gallbladder sludge noted. No definite sonographic Percell Miller sign is identified. Common bile duct:  Diameter: 5 mm.  No intrahepatic or extrahepatic biliary dilatation. Liver: Mild diffuse increase in echogenicity noted. Two separate focal hypoechoic areas within the liver are identified measuring 2.6 x 1.8 x 2.8 cm and 1.9 x 1.1 x 2.6 cm. The portal vein is patent on color Doppler imaging with normal direction of blood flow towards the liver. IMPRESSION: 1. Gallbladder sludge with gallbladder wall thickening and pericholecystic fluid without definite sonographic Murphy sign, which may represent acute cholecystitis. No biliary dilatation. 2. Hepatic steatosis. Two separate focal hypoechoic areas within the liver are indeterminate but may represent focal fatty sparing. Consider elective MRI of the abdomen with and without contrast or short-term ultrasound follow-up. Electronically Signed   By: Margarette Canada M.D.   On: 05/12/2019 12:42      Assessment/Plan Active Problems:   * No active hospital problems. *  Gallstone pancreatitis - lipase over 8K, very tender on exam - recommend medicine admission, CLD, antibiotics for likely cholecystitis, IVF for lactic acid - will plan surgery (lap chole) based on lipase and improvement in pain - we will follow   Thank you for the consult.    Kalman Drape, [facility] Surgery 05/12/2019, 2:18 PM Pager: [phone] Consults: [phone] Mon-Fri 7:00 am-4:30 pm Sat-Sun 7:00 am-11:30 am

## 2019-05-12 NOTE — Progress Notes (Signed)
md paged for VS

## 2019-05-12 NOTE — Progress Notes (Signed)
PHARMACY - PHYSICIAN COMMUNICATION CRITICAL VALUE ALERT - BLOOD CULTURE IDENTIFICATION (BCID)  [name] is an 63 y.o. female who presented to [facility] on 05/12/2019 with a chief complaint of abdominal pain.  Assessment:  Started on ABX for intra-abdominal infection w/ possible cholecystitis/pancreatitis; all four bottles of blood cx return growing strep spp and E.coli; pt has been clinically worsening since admission earlier today.  Name of physician (or Provider) ContactedWillodean Rosenthal MD  Current antibiotics: Zosyn >> Unasyn + vancomycin  Changes to prescribed antibiotics recommended:  Per discussion with Dr Berline Lopes will change Unasyn back to Zosyn and d/c vanc.  Could consider changing ABX to Rocephin 2g Q24 +/- Flagyl if pt does not improve on Zosyn.  Results for orders placed or performed during the hospital encounter of 05/12/19  Blood Culture ID Panel (Reflexed) (Collected: 05/12/2019 11:45 AM)  Result Value Ref Range   Enterococcus species NOT DETECTED NOT DETECTED   Listeria monocytogenes NOT DETECTED NOT DETECTED   Staphylococcus species NOT DETECTED NOT DETECTED   Staphylococcus aureus (BCID) NOT DETECTED NOT DETECTED   Streptococcus species DETECTED (A) NOT DETECTED   Streptococcus agalactiae NOT DETECTED NOT DETECTED   Streptococcus pneumoniae NOT DETECTED NOT DETECTED   Streptococcus pyogenes NOT DETECTED NOT DETECTED   Acinetobacter baumannii NOT DETECTED NOT DETECTED   Enterobacteriaceae species DETECTED (A) NOT DETECTED   Enterobacter cloacae complex NOT DETECTED NOT DETECTED   Escherichia coli DETECTED (A) NOT DETECTED   Klebsiella oxytoca NOT DETECTED NOT DETECTED   Klebsiella pneumoniae NOT DETECTED NOT DETECTED   Proteus species NOT DETECTED NOT DETECTED   Serratia marcescens NOT DETECTED NOT DETECTED   Carbapenem resistance NOT DETECTED NOT DETECTED   Haemophilus influenzae NOT DETECTED NOT DETECTED   Neisseria meningitidis NOT DETECTED NOT DETECTED   Pseudomonas aeruginosa NOT DETECTED NOT DETECTED   Candida albicans NOT DETECTED NOT DETECTED   Candida glabrata NOT DETECTED NOT DETECTED   Candida krusei NOT DETECTED NOT DETECTED   Candida parapsilosis NOT DETECTED NOT DETECTED   Candida tropicalis NOT DETECTED NOT DETECTED    Wynona Neat, PharmD, BCPS  05/12/2019  11:33 PM

## 2019-05-13 ENCOUNTER — Inpatient Hospital Stay (HOSPITAL_COMMUNITY): Payer: Medicare Other

## 2019-05-13 DIAGNOSIS — I10 Essential (primary) hypertension: Secondary | ICD-10-CM

## 2019-05-13 DIAGNOSIS — K851 Biliary acute pancreatitis without necrosis or infection: Secondary | ICD-10-CM

## 2019-05-13 LAB — GLUCOSE, CAPILLARY
Glucose-Capillary: 89 mg/dL (ref 70–99)
Glucose-Capillary: 80 mg/dL (ref 70–99)

## 2019-05-13 LAB — HEPATIC FUNCTION PANEL
ALT: 656 U/L — ABNORMAL HIGH (ref 0–44)
AST: 1273 U/L — ABNORMAL HIGH (ref 15–41)
Albumin: 2.6 g/dL — ABNORMAL LOW (ref 3.5–5.0)
Alkaline Phosphatase: 91 U/L (ref 38–126)
Bilirubin, Direct: 2 mg/dL — ABNORMAL HIGH (ref 0.0–0.2)
Indirect Bilirubin: 0.7 mg/dL (ref 0.3–0.9)
Total Bilirubin: 2.7 mg/dL — ABNORMAL HIGH (ref 0.3–1.2)
Total Protein: 5.2 g/dL — ABNORMAL LOW (ref 6.5–8.1)

## 2019-05-13 LAB — LACTIC ACID, PLASMA
Lactic Acid, Venous: 4.4 mmol/L (ref 0.5–1.9)
Lactic Acid, Venous: 4 mmol/L (ref 0.5–1.9)
Lactic Acid, Venous: 4.6 mmol/L (ref 0.5–1.9)

## 2019-05-13 LAB — CBC
HCT: 29.2 % — ABNORMAL LOW (ref 36.0–46.0)
Hemoglobin: 9.4 g/dL — ABNORMAL LOW (ref 12.0–15.0)
MCH: 32.3 pg (ref 26.0–34.0)
MCHC: 32.2 g/dL (ref 30.0–36.0)
MCV: 100.3 fL — ABNORMAL HIGH (ref 80.0–100.0)
Platelets: 107 10*3/uL — ABNORMAL LOW (ref 150–400)
RBC: 2.91 MIL/uL — ABNORMAL LOW (ref 3.87–5.11)
RDW: 14.9 % (ref 11.5–15.5)
WBC: 13.2 10*3/uL — ABNORMAL HIGH (ref 4.0–10.5)
nRBC: 0.4 % — ABNORMAL HIGH (ref 0.0–0.2)

## 2019-05-13 LAB — BASIC METABOLIC PANEL
Anion gap: 12 (ref 5–15)
BUN: 26 mg/dL — ABNORMAL HIGH (ref 8–23)
CO2: 18 mmol/L — ABNORMAL LOW (ref 22–32)
Calcium: 7.8 mg/dL — ABNORMAL LOW (ref 8.9–10.3)
Chloride: 105 mmol/L (ref 98–111)
Creatinine, Ser: 2.86 mg/dL — ABNORMAL HIGH (ref 0.44–1.00)
GFR calc Af Amer: 19 mL/min — ABNORMAL LOW (ref >60)
GFR calc non Af Amer: 17 mL/min — ABNORMAL LOW (ref >60)
Glucose, Bld: 93 mg/dL (ref 70–99)
Potassium: 4.6 mmol/L (ref 3.5–5.1)
Sodium: 135 mmol/L (ref 135–145)

## 2019-05-13 LAB — ECHOCARDIOGRAM COMPLETE
Height: 61 in
Weight: 3425.07 oz

## 2019-05-13 LAB — MRSA PCR SCREENING: MRSA by PCR: NEGATIVE

## 2019-05-13 LAB — HIV ANTIBODY (ROUTINE TESTING W REFLEX): HIV Screen 4th Generation wRfx: NONREACTIVE

## 2019-05-13 MED ORDER — NOREPINEPHRINE 4 MG/250ML-% IV SOLN
2.0000 ug/min | INTRAVENOUS | Status: DC
  Filled 2019-05-13 (×2): qty 250

## 2019-05-13 MED ORDER — LACTATED RINGERS IV BOLUS
1500.0000 mL | Freq: Once | INTRAVENOUS | Status: AC
  Administered 2019-05-13: 03:00:00 1500 mL via INTRAVENOUS

## 2019-05-13 MED ORDER — LACTATED RINGERS IV BOLUS
1000.0000 mL | Freq: Once | INTRAVENOUS | Status: AC
  Administered 2019-05-13: 1000 mL via INTRAVENOUS

## 2019-05-13 MED ORDER — FENTANYL CITRATE (PF) 100 MCG/2ML IJ SOLN: 25.0000 ug | INTRAMUSCULAR | Status: DC | PRN

## 2019-05-13 MED ORDER — SODIUM CHLORIDE 0.9 % IV SOLN
250.0000 mL | INTRAVENOUS | Status: DC
  Administered 2019-05-13: 250 mL via INTRAVENOUS

## 2019-05-13 MED ORDER — SODIUM BICARBONATE 8.4 % IV SOLN
100.0000 meq | Freq: Once | INTRAVENOUS | Status: AC
  Administered 2019-05-13: 100 meq via INTRAVENOUS
  Filled 2019-05-13: qty 50

## 2019-05-13 MED ORDER — ACETAMINOPHEN 325 MG PO TABS
650.0000 mg | ORAL_TABLET | Freq: Four times a day (QID) | ORAL | Status: DC | PRN
  Administered 2019-05-13 – 2019-05-22 (×14): 650 mg via ORAL
  Filled 2019-05-13 (×14): qty 2

## 2019-05-13 MED ORDER — LACTATED RINGERS IV SOLN
INTRAVENOUS | Status: DC
  Administered 2019-05-13 – 2019-05-15 (×5): via INTRAVENOUS
  Filled 2019-05-13: qty 1000

## 2019-05-13 MED ORDER — OXYCODONE HCL 5 MG PO TABS
5.0000 mg | ORAL_TABLET | Freq: Three times a day (TID) | ORAL | Status: DC | PRN
  Administered 2019-05-13 – 2019-05-14 (×2): 5 mg via ORAL
  Filled 2019-05-13 (×2): qty 1

## 2019-05-13 MED ORDER — SODIUM CHLORIDE 0.9 % IV SOLN
3.0000 g | Freq: Two times a day (BID) | INTRAVENOUS | Status: DC
  Administered 2019-05-13 – 2019-05-15 (×5): 3 g via INTRAVENOUS
  Filled 2019-05-13 (×5): qty 8

## 2019-05-13 NOTE — Progress Notes (Signed)
Patient continues to be borderline hypotensive with a MAP just above 65 over the past 6-7 hours despite fluid resuscitation with ~6L crystalloids. Patient remains oriented but with decreased mentation and alternating levels of alertness. Labs demonstrate a persistence of her lactic acidosis to ~4 and worsening renal function since admission with her sCr of  2.86 from 2.21. Baseline sCr appears to be closer to 2.0.  Patient voided just 200cc, awaiting bladder scan results. She does not appear to be responding to fluid resucitation and given her persistent hypotension, tachypnea, mild tachycardia, supplemental oxygen requirement, worsening renal function and lack of improvement I feel she may need more intensive care over the next 24-48 hours.  Vitals:   05/13/19 0206 05/13/19 0238  BP: (!) 75/64 (!) 85/58  Pulse:  100  Resp:  (!) 27  Temp:    SpO2:  96%   Plan: Ecoli/strep bacteremia 2/2 to biliary obstruction  - continue zosyn and fluid resuscitation   - bladder scan  - Hepatic panel, CBC and lactate ordered  - Continue to monitor vitals routinely   I suspect she will likely respond well to her antibiotics but this may take time that I am uncertain she will have if her blood pressure does not maintain. We have discussed the case with PCCM who are willing to follow. They are considering transfer to the ICU where she can be more closely monitored and if need treated with low dose vasopressors. We greatly appreciated their assistance with our patient.  Kathi Ludwig, MD [facility], PGY-2 Pager # [phone]

## 2019-05-13 NOTE — Progress Notes (Signed)
CRITICAL VALUE ALERT  Critical Value:  Lactic acid 4.4  Date & Time Notied:  05/13/19 0445  Provider Notified:  Deterding, MD  Orders Received/Actions taken: awaiting orders

## 2019-05-13 NOTE — Consult Note (Signed)
NAME:  [name], MRN:  [id number], DOB:  01-25-1956, LOS: 1 ADMISSION DATE:  05/12/2019, CONSULTATION DATE:  05/13/19 REFERRING MD:  Rebeca Alert  CHIEF COMPLAINT:  Hypotension   Brief History   [name] is a 63 y.o. female who was admitted 7/9 with abdominal pain felt to be due to gallstone pancreatitis.  Received 6L IVF and remained hypotensive; therefore, PCCM consulted for transfer to ICU.  History of present illness   [name] is a 63 y.o. female who has a PMH including but not limited to HTN, HLD, gout, L renal mass s/p nephrectomy (see "past medical history" for rest).  She presented to [facility] ED 7/9 with abdominal pain that started earlier that morning and had gradually worsened.  Began to experience nausea and vomiting with loss of appetite; therefore, came to ED for further evaluation  In ED, RUQ US demonstrated GB sludge with wall thickening and hepatic steatosis.  Lipase elevated at 8025 and lactic acid 4.2.    She was admitted by IMTS and was see in consultation by general surgery who recommended medical management for now with possible lap chole if no improvement.  Of note, since admission BC's have started growing GNR's.  She is currently on zosyn.  Past Medical History  HTN, HLD, gout, neuropathy, L renal mass s/p nephrectomy.  Significant Hospital Events   7/9 > admit. 7/10 > transfer to ICU.  Consults:  Surgery. PCCM.  Procedures:  None.  Significant Diagnostic Tests:  RUQ [place] 7/9 > GB sludge, wall thickening, hepatic steatosis. MRCP 7/10 >   Micro Data:  Blood 7/9 > strep, enterobacter, E.coli >  SARS CoV 27/9 > neg.  Antimicrobials:  Vanc 7/9 > 7/10. Zosyn 7/9 >   Interim history/subjective:  Awake.  Mild abdominal pain.  Thirsty.  Objective:  Blood pressure (!) 85/58, pulse 100, temperature 98.9 F (37.2 C), resp. rate (!) 27, height 5\' 1"  (1.549 m), weight 94.5 kg, SpO2 96 %.        Intake/Output Summary (Last 24 hours) at 05/13/2019  0252 Last data filed at 05/13/2019 0200 Gross per 24 hour  Intake 1533.15 ml  Output 200 ml  Net 1333.15 ml   Filed Weights   05/12/19 1936  Weight: 94.5 kg    Examination: General: Adult female, in NAD. Neuro: A&O x 3, no deficits. HEENT: [place]/AT. Sclerae anicteric.  EOMI.  MM dry. Cardiovascular: RRR, no M/R/G.  Lungs: Respirations even and unlabored.  CTA bilaterally, No W/R/R.   Abdomen: Obese.  BS x 4, soft, NT/ND.  Musculoskeletal: No gross deformities, no edema.  Skin: Intact, warm, no rashes.  Assessment & Plan:   Septic shock - due to GNR bacteremia (cultures still pending),  presumed in setting cholecystitis and pancreatitis.  RUQ 7/9 with no stones noted (? Already passed).  Now s/p 6L and MAP's borderline and hovering between 60 - 68. - Transfer to ICU. - Continue empiric zosyn and follow cultures through completion. - Additional 1.5 L LR bolus now then continue MIVF at 125/hr. - If MAP's drop, start low dose levophed peripherally as needed to maintain MAP > 65. - Trend lactic acid, lipase. - Surgery following. - F/u on MRCP. - Low dose fentanyl PRN pain.  Mild NAGMA. AoCKD - with underlying hx L renal mass s/p nephrectomy. - 2amps HCO3. - Continue fluids. - Follow BMP.  Transaminitis - presumed 2/2 low flow state and cholecystitis with hepatic steatosis. - D/c statin. - Follow LFT's, tylenol.  Hx HTN, HLD. - Hold preadmission metoprolol, losartan, lovastatin. - Continue preadmission ASA.  Hx Gout. - Continue preadmission allopurinol.  Best Practice:  Diet: NPO. Pain/Anxiety/Delirium protocol (if indicated): Low dose fentanyl PRN pain. VAP protocol (if indicated): None. DVT prophylaxis: SCD's / Heparin. GI prophylaxis: None. Glucose control: None. Mobility: Bedrest. Code Status: Full. Family Communication: None. Disposition: ICU.  Labs   CBC: Recent Labs  Lab 05/12/19 0915  WBC 3.5*  NEUTROABS 2.9  HGB 12.0  HCT 37.7  MCV 100.3*  PLT  801   Basic Metabolic Panel: Recent Labs  Lab 05/12/19 0915 05/13/19 0047  NA 137 135  K 4.0 4.6  CL 102 105  CO2 22 18*  GLUCOSE 104* 93  BUN 25* 26*  CREATININE 2.21* 2.86*  CALCIUM 9.1 7.8*   GFR: Estimated Creatinine Clearance: 20.6 mL/min (A) (by C-G formula based on SCr of 2.86 mg/dL (H)). Recent Labs  Lab 05/12/19 0915 05/12/19 1300 05/12/19 1500 05/12/19 1940 05/12/19 2229  WBC 3.5*  --   --   --   --   LATICACIDVEN  --  4.2* 4.0* 4.1* 3.8*   Liver Function Tests: Recent Labs  Lab 05/12/19 0915 05/12/19 1646  AST 753*  --   ALT 312*  --   ALKPHOS 126  --   BILITOT 1.6* 2.6*  PROT 6.9  --   ALBUMIN 3.6  --    Recent Labs  Lab 05/12/19 0915  LIPASE 8,025*   No results for input(s): AMMONIA in the last 168 hours. ABG    Component Value Date/Time   PHART 7.459 (H) 10/11/2007 0337   PCO2ART 43.2 10/11/2007 0337   PO2ART 66.0 (L) 10/11/2007 0337   HCO3 30.6 (H) 10/11/2007 0337   TCO2 24 04/16/2008 1715   ACIDBASEDEF 1.0 10/10/2007 0404   O2SAT 93.0 10/11/2007 0337    Coagulation Profile: No results for input(s): INR, PROTIME in the last 168 hours. Cardiac Enzymes: No results for input(s): CKTOTAL, CKMB, CKMBINDEX, TROPONINI in the last 168 hours. HbA1C: Hgb A1c MFr Bld  Date/Time Value Ref Range Status  04/16/2008 08:30 PM   Final   5.3 (NOTE)   The ADA recommends the following therapeutic goals for glycemic   control related to Hgb A1C measurement:   Goal of Therapy:   < 7.0% Hgb A1C   Action Suggested:  > 8.0% Hgb A1C   Ref:  Diabetes Care, 22, Suppl. 1, 1999   CBG: No results for input(s): GLUCAP in the last 168 hours.  Review of Systems:   All negative; except for those that are bolded, which indicate positives.  Constitutional: weight loss, weight gain, night sweats, fevers, chills, fatigue, weakness.  HEENT: headaches, sore throat, sneezing, nasal congestion, post nasal drip, difficulty swallowing, tooth/dental problems, visual  complaints, visual changes, ear aches. Neuro: difficulty with speech, weakness, numbness, ataxia. CV:  chest pain, orthopnea, PND, swelling in lower extremities, dizziness, palpitations, syncope.  Resp: cough, hemoptysis, dyspnea, wheezing. GI: heartburn, indigestion, abdominal pain, nausea, vomiting, diarrhea, constipation, change in bowel habits, loss of appetite, hematemesis, melena, hematochezia.  GU: dysuria, change in color of urine, urgency or frequency, flank pain, hematuria. MSK: joint pain or swelling, decreased range of motion. Psych: change in mood or affect, depression, anxiety, suicidal ideations, homicidal ideations. Skin: rash, itching, bruising.   Past medical history  She,  has a past medical history of History of gout, Hyperlipidemia, Hypertension, Kidney stones, Pancreatitis (05/2019), Renal mass, left, Single kidney, and Stroke ([place]).   Surgical History  Past Surgical History:  Procedure Laterality Date  . CESAREAN SECTION     times 2  . CYSTOSCOPY WITH RETROGRADE PYELOGRAM, URETEROSCOPY AND STENT PLACEMENT Bilateral 06/08/2015   Procedure: CYSTOSCOPY WITH RETROGRADE PYELOGRAM, URETEROSCOPY, STONE EXTRACTION AND STENT PLACEMENT ON THE RIGHT, DIAGNOSTIC URETEROSCOPY AND STENT PLACEMENT ON THE LEFT;  Surgeon: Alexis Frock, MD;  Location: [facility];  Service: Urology;  Laterality: Bilateral;  . HOLMIUM LASER APPLICATION Right 01/03/2950   Procedure: HOLMIUM LASER APPLICATION;  Surgeon: Alexis Frock, MD;  Location: [facility];  Service: Urology;  Laterality: Right;  . ROBOT ASSISTED LAPAROSCOPIC NEPHRECTOMY Left 07/18/2015   Procedure: ROBOTIC ASSISTED LAPAROSCOPIC NEPHRECTOMY;  Surgeon: Alexis Frock, MD;  Location: [facility];  Service: Urology;  Laterality: Left;  . TUBAL LIGATION       Social History   reports that she has never smoked. She has never used smokeless tobacco. She reports that she does not drink alcohol or use drugs.   Family history   Her family history  includes Gout in her mother; Hypertension in her mother; Pancreatitis in her sister.   Allergies No Known Allergies   Home meds  Prior to Admission medications   Medication Sig Start Date End Date Taking? Authorizing Provider  allopurinol (ZYLOPRIM) 300 MG tablet Take 300 mg by mouth daily. 05/11/19  Yes [provider]  aspirin EC 81 MG tablet Take 81 mg by mouth daily.   Yes [provider]  Cholecalciferol (VITAMIN D3 PO) Take 5,000 mg by mouth daily.   Yes [provider]  Cranberry 500 MG CAPS Take 500 mg by mouth daily.   Yes [provider]  gabapentin (NEURONTIN) 300 MG capsule Take 300 mg by mouth 3 (three) times daily. 04/21/19  Yes [provider]  losartan (COZAAR) 50 MG tablet Take 50 mg by mouth daily. 02/13/19  Yes [provider]  lovastatin (MEVACOR) 20 MG tablet Take 20 mg by mouth every morning.    Yes [provider]  metoprolol tartrate (LOPRESSOR) 25 MG tablet Take 25 mg by mouth 2 (two) times daily.   Yes [provider]  senna-docusate (SENOKOT S) 8.6-50 MG per tablet Take 1 tablet by mouth 2 (two) times daily. While taking pain meds to prevent constipation Patient taking differently: Take 1 tablet by mouth 2 (two) times daily as needed for mild constipation.  06/08/15  Yes Alexis Frock, MD  HYDROcodone-acetaminophen (NORCO/VICODIN) 5-325 MG tablet Take 2 tablets by mouth every 4 (four) hours as needed. Patient not taking: Reported on 05/12/2019 07/18/18   Henderly, Britni A, PA-C  oxyCODONE-acetaminophen (ROXICET) 5-325 MG per tablet Take 1-2 tablets by mouth every 6 (six) hours as needed for moderate pain or severe pain. Post-operatively Patient not taking: Reported on 05/12/2019 07/18/15   Debbrah Alar, PA-C  senna-docusate (SENOKOT-S) 8.6-50 MG per tablet Take 1 tablet by mouth 2 (two) times daily. While taking pain meds to prevent constipation. Patient not taking: Reported on 05/12/2019 07/20/15   Alexis Frock, MD     [place] Hora, [facility] Pager: [phone].  If no answer, [phone] 05/13/2019, 2:52 AM

## 2019-05-13 NOTE — Progress Notes (Signed)
  Echocardiogram 2D Echocardiogram has been performed.  [name] 05/13/2019, 11:45 AM

## 2019-05-13 NOTE — Progress Notes (Signed)
Subjective/Chief Complaint: Still having pain. Hypotension and large volume crystalloid resus o/n requiring ICU transfer   Objective: Vital signs in last 24 hours: Temp:  [98 F (36.7 C)-98.9 F (37.2 C)] 98 F (36.7 C) (07/10 0432) Pulse Rate:  [64-108] 102 (07/10 0745) Resp:  [16-37] 24 (07/10 0745) BP: (74-176)/(49-140) 101/58 (07/10 0745) SpO2:  [89 %-100 %] 93 % (07/10 0745) Weight:  [94.5 kg-97.1 kg] 97.1 kg (07/10 0432) Last BM Date: 05/12/19  Intake/Output from previous day: 07/09 0701 - 07/10 0700 In: 4886 [I.V.:889.1; IV Piggyback:3996.9] Out: 200 [Urine:200] Intake/Output this shift: No intake/output data recorded.  General appearance: alert and cooperative Eyes: anicteric Resp: unalbored Cardio: regular rate and rhythm GI: obese, tender in central abdomen, distended Skin: Skin color, texture, turgor normal. No rashes or lesions Neurologic: Grossly normal  Lab Results:  Recent Labs    05/12/19 0915 05/13/19 0244  WBC 3.5* 13.2*  HGB 12.0 9.4*  HCT 37.7 29.2*  PLT 156 107*   BMET Recent Labs    05/12/19 0915 05/13/19 0047  NA 137 135  K 4.0 4.6  CL 102 105  CO2 22 18*  GLUCOSE 104* 93  BUN 25* 26*  CREATININE 2.21* 2.86*  CALCIUM 9.1 7.8*   PT/INR No results for input(s): LABPROT, INR in the last 72 hours. ABG No results for input(s): PHART, HCO3 in the last 72 hours.  Invalid input(s): PCO2, PO2  Studies/Results: Dg Chest Port 1 View  Result Date: 05/12/2019 CLINICAL DATA:  Chest and epigastric region pain.  Hypertension. EXAM: PORTABLE CHEST 1 VIEW COMPARISON:  August 08, 2016 FINDINGS: There is no edema or consolidation. Heart is slightly enlarged with pulmonary vascularity normal. No adenopathy. No bone lesions. There are surgical clips in the left neck region. IMPRESSION: Stable cardiac prominence.  No edema or consolidation. Electronically Signed   By: Lowella Grip III M.D.   On: 05/12/2019 09:30   US Abdomen Limited  Ruq  Result Date: 05/12/2019 CLINICAL DATA:  63 year old female with acute abdominal pain for 1 day. EXAM: ULTRASOUND ABDOMEN LIMITED RIGHT UPPER QUADRANT COMPARISON:  None. FINDINGS: Gallbladder: Gallbladder wall thickening, pericholecystic fluid and gallbladder sludge noted. No definite sonographic Percell Miller sign is identified. Common bile duct: Diameter: 5 mm.  No intrahepatic or extrahepatic biliary dilatation. Liver: Mild diffuse increase in echogenicity noted. Two separate focal hypoechoic areas within the liver are identified measuring 2.6 x 1.8 x 2.8 cm and 1.9 x 1.1 x 2.6 cm. The portal vein is patent on color Doppler imaging with normal direction of blood flow towards the liver. IMPRESSION: 1. Gallbladder sludge with gallbladder wall thickening and pericholecystic fluid without definite sonographic Murphy sign, which may represent acute cholecystitis. No biliary dilatation. 2. Hepatic steatosis. Two separate focal hypoechoic areas within the liver are indeterminate but may represent focal fatty sparing. Consider elective MRI of the abdomen with and without contrast or short-term ultrasound follow-up. Electronically Signed   By: Margarette Canada M.D.   On: 05/12/2019 12:42    Anti-infectives: Anti-infectives (From admission, onward)   Start     Dose/Rate Route Frequency Ordered Stop   05/13/19 0600  piperacillin-tazobactam (ZOSYN) IVPB 3.375 g     3.375 g 12.5 mL/hr over 240 Minutes Intravenous Every 8 hours 05/12/19 2332     05/12/19 2215  Ampicillin-Sulbactam (UNASYN) 3 g in sodium chloride 0.9 % 100 mL IVPB  Status:  Discontinued     3 g 200 mL/hr over 30 Minutes Intravenous Every 6 hours 05/12/19 2204 05/12/19 2331  05/12/19 2215  vancomycin (VANCOCIN) 2,000 mg in sodium chloride 0.9 % 500 mL IVPB  Status:  Discontinued     2,000 mg 250 mL/hr over 120 Minutes Intravenous  Once 05/12/19 2212 05/12/19 2331   05/12/19 2200  piperacillin-tazobactam (ZOSYN) IVPB 3.375 g  Status:  Discontinued      3.375 g 12.5 mL/hr over 240 Minutes Intravenous Every 8 hours 05/12/19 1946 05/12/19 2204   05/12/19 1909  vancomycin variable dose per unstable renal function (pharmacist dosing)  Status:  Discontinued      Does not apply See admin instructions 05/12/19 1910 05/12/19 1910   05/12/19 1300  piperacillin-tazobactam (ZOSYN) IVPB 3.375 g     3.375 g 100 mL/hr over 30 Minutes Intravenous  Once 05/12/19 1247 05/12/19 1450      Assessment/Plan:  63yo woman with history of obesity, HLD, gout, L renal mass s/p L nephrectomy with baseline Renal insufficiency, remote hx stroke, hepatic steatosis, admitted with  Pancreatitis- lipase>8k yesterday Transaminitis- non-obstructive pattern likely secondary to pancreatitis/ shock GNR bacteremia Shock, acidosis   MRCP pending. Continue resuscitation, bowel rest, empiric antibiotics; supportive care. Surgery will follow peripherally for now. If sepsis improves and pancreatitis resolves can consider lap chole this admission.     LOS: 1 day    Clovis Riley 05/13/2019

## 2019-05-14 LAB — CBC WITH DIFFERENTIAL/PLATELET
Abs Immature Granulocytes: 0.7 10*3/uL — ABNORMAL HIGH (ref 0.00–0.07)
Basophils Absolute: 0 10*3/uL (ref 0.0–0.1)
Basophils Relative: 0 %
Eosinophils Absolute: 0.1 10*3/uL (ref 0.0–0.5)
Eosinophils Relative: 1 %
HCT: 28.9 % — ABNORMAL LOW (ref 36.0–46.0)
Hemoglobin: 9.4 g/dL — ABNORMAL LOW (ref 12.0–15.0)
Immature Granulocytes: 9 %
Lymphocytes Relative: 10 %
Lymphs Abs: 0.7 10*3/uL (ref 0.7–4.0)
MCH: 31.8 pg (ref 26.0–34.0)
MCHC: 32.5 g/dL (ref 30.0–36.0)
MCV: 97.6 fL (ref 80.0–100.0)
Monocytes Absolute: 0.4 10*3/uL (ref 0.1–1.0)
Monocytes Relative: 5 %
Neutro Abs: 5.6 10*3/uL (ref 1.7–7.7)
Neutrophils Relative %: 75 %
Platelets: 69 10*3/uL — ABNORMAL LOW (ref 150–400)
RBC: 2.96 MIL/uL — ABNORMAL LOW (ref 3.87–5.11)
RDW: 15 % (ref 11.5–15.5)
WBC: 7.5 10*3/uL (ref 4.0–10.5)
nRBC: 0.3 % — ABNORMAL HIGH (ref 0.0–0.2)

## 2019-05-14 LAB — COMPREHENSIVE METABOLIC PANEL
ALT: 627 U/L — ABNORMAL HIGH (ref 0–44)
AST: 695 U/L — ABNORMAL HIGH (ref 15–41)
Albumin: 2.5 g/dL — ABNORMAL LOW (ref 3.5–5.0)
Alkaline Phosphatase: 91 U/L (ref 38–126)
Anion gap: 13 (ref 5–15)
BUN: 34 mg/dL — ABNORMAL HIGH (ref 8–23)
CO2: 21 mmol/L — ABNORMAL LOW (ref 22–32)
Calcium: 8 mg/dL — ABNORMAL LOW (ref 8.9–10.3)
Chloride: 103 mmol/L (ref 98–111)
Creatinine, Ser: 2.61 mg/dL — ABNORMAL HIGH (ref 0.44–1.00)
GFR calc Af Amer: 21 mL/min — ABNORMAL LOW (ref >60)
GFR calc non Af Amer: 19 mL/min — ABNORMAL LOW (ref >60)
Glucose, Bld: 74 mg/dL (ref 70–99)
Potassium: 4.6 mmol/L (ref 3.5–5.1)
Sodium: 137 mmol/L (ref 135–145)
Total Bilirubin: 3.7 mg/dL — ABNORMAL HIGH (ref 0.3–1.2)
Total Protein: 5.4 g/dL — ABNORMAL LOW (ref 6.5–8.1)

## 2019-05-14 LAB — PHOSPHORUS: Phosphorus: 5.2 mg/dL — ABNORMAL HIGH (ref 2.5–4.6)

## 2019-05-14 LAB — MAGNESIUM: Magnesium: 1.1 mg/dL — ABNORMAL LOW (ref 1.7–2.4)

## 2019-05-14 LAB — LIPASE, BLOOD: Lipase: 35 U/L (ref 11–51)

## 2019-05-14 LAB — LACTIC ACID, PLASMA: Lactic Acid, Venous: 1.8 mmol/L (ref 0.5–1.9)

## 2019-05-14 MED ORDER — CHLORHEXIDINE GLUCONATE CLOTH 2 % EX PADS
6.0000 | MEDICATED_PAD | Freq: Every day | CUTANEOUS | Status: DC
  Administered 2019-05-14 – 2019-05-17 (×3): 6 via TOPICAL

## 2019-05-14 NOTE — Progress Notes (Addendum)
NAME:  [name], MRN:  [id number], DOB:  1956-04-05, LOS: 2 ADMISSION DATE:  05/12/2019, CONSULTATION DATE:  05/13/19 REFERRING MD:  Rebeca Alert  CHIEF COMPLAINT:  Hypotension   Brief History   [name] is a 63 y.o. female who was admitted 7/9 with abdominal pain felt to be due to gallstone pancreatitis.  Received 6L IVF and remained hypotensive; therefore, PCCM consulted for transfer to ICU.   Past Medical History  HTN, HLD, gout, neuropathy, L renal mass s/p nephrectomy.  Significant Hospital Events   7/9 > admit. 7/10 > transfer to ICU. Found to have GNR bacteremia  Seen by gen surg. Recommended medical rx for now and then consider lap chole when pancreatitis improved.  7/11: Has remained off vasoactive drips all evening.  Currently up in chair.  Pain is tolerable.  Wants to drink water.  Awaiting lipase and lactate  Consults:  Surgery. PCCM.  Procedures:  None.  Significant Diagnostic Tests:  RUQ [place] 7/9 > GB sludge, wall thickening, hepatic steatosis. MRCP 7/10 > Acute pancreatitis. No organized peripancreatic fluid collection/pseudocyst. Cholelithiasis with gallbladder wall edema/pericholecystic fluid, favored to be reactive. No intrahepatic or extrahepatic ductal dilatation. No choledocholithiasis is seen. Mild wall thickening involving the duodenum, favored to be secondary inflammatory changes, less likely duodenitis. Mild wall thickening involving the ascending colon, poorly evaluated on MR, infectious/inflammatory colitis not excluded. ECHO 1. The left ventricle has normal systolic function, with an ejection fraction of 55-60%. The cavity size was normal. There is mildly increased left ventricular wall thickness. Left ventricular diastolic Doppler parameters are indeterminate.  2. The mitral valve is abnormal. Mild thickening of the mitral valve leaflet. There is mild mitral annular calcification present.  3. The aortic valve was not well visualized. No stenosis of  the aortic valve.  4. The inferior vena cava was dilated in size with <50% respiratory variability.  Micro Data:  Blood 7/9 > strep, enterobacter, E.coli >  SARS CoV 27/9 > neg.  Antimicrobials:  Vanc 7/9 > 7/10. unasyn 7/9 >   Interim history/subjective:   No distress overnight. Objective:  Blood pressure (Abnormal) 148/77, pulse 99, temperature 98 F (36.7 C), temperature source Oral, resp. rate 18, height 5\' 1"  (1.549 m), weight 98.6 kg, SpO2 95 %.        Intake/Output Summary (Last 24 hours) at 05/14/2019 0824 Last data filed at 05/14/2019 0800 Gross per 24 hour  Intake 3194.97 ml  Output 1300 ml  Net 1894.97 ml   Filed Weights   05/12/19 1936 05/13/19 0432 05/14/19 0357  Weight: 94.5 kg 97.1 kg 98.6 kg    Examination:  General: 63 year old obese female currently resting in the chair she is in no acute distress this a.m. HEENT normocephalic atraumatic no jugular venous distention mucous membranes are moist sclera nonicteric Pulmonary: Clear to auscultation diminished bases no accessory use on exam Cardiac: Regular rate and rhythm without murmur rub or gallop Abdomen: Soft, not tender, no organomegaly Extremities: Warm dry brisk capillary refill strong pulses Neuro: Somewhat sedated but oriented, moves all extremities, no focal deficits. GU: Clear yellow when voids  Assessment & Plan:   Septic shock - due to GNR bacteremia as well as GPC (cultures still pending),  presumed in setting cholecystitis and pancreatitis.  RUQ 7/9 with no stones noted (? Already passed). Plan Cont MIVFs, will decrease to 100 mL an hour Day # 2 unasyn, await final sensitivities Trend Lipase  Acute hypoxic respiratory failure secondary to bilateral left greater than right  atelectasis with pleural effusions -Not surprising and fairly typical finding during acute pancreatitis Required escalating supplemental oxygen starting on 7/10 When lying recumbent for MRCP.  Follow-up chest x-ray  evaluated personally: This shows by basilar atelectasis left looks worse than right, also element of pleural effusion Plan Add incentive spirometry Continue pulse oximetry Decrease IV fluids to 100 mL an hour Titrate supplemental oxygen She eventually may need diuresis however will hold off on this currently Mild NAGMA AND lactic acidosis in setting of sepsis -acid base improved Plan Repeat LA this am   AoCKD - with underlying hx L renal mass s/p nephrectomy. -renal fxn stable/improved.  Plan Avoid hypotension Renal dose medications A.m. chemistry  Transaminitis - presumed 2/2 low flow state and cholecystitis with hepatic steatosis. Much improved Statin d/cd Plan Repeat LFTs am   Thrombocytopenia Inclined to think this is secondary to sepsis Plan Change to SCDs Trend CBC  Hx HTN, HLD. Plan Holding antihypertensives   Hx Gout. Plan Ok to cont preadmission allopurinol as renal fxn improved.   Best Practice:  Diet: NPO. Pain/Anxiety/Delirium protocol (if indicated): Low dose fentanyl PRN pain. VAP protocol (if indicated): None. DVT prophylaxis: SCD's / Heparin stopped 7/11 d/t decreased platelets  GI prophylaxis: None. Glucose control: None. Mobility: Bedrest. Code Status: Full. Family Communication: None. Disposition: She has remained off pressors since her admission to the intensive care she is hemodynamically stable alert and oriented.  We are awaiting lipase and repeat lactate, however I think she can move to progressive care unit.  Will defer timing of surgery to the surgical service.  Continue current antimicrobials, will transfer to the internal medicine teaching service  Erick Colace ACNP-BC [place] Pager # [phone] OR # [phone] if no answer    ATTENDING ATTESTATION  Patient seen and examined with Nurse Practitioner.  Agree with their assessment and plan as written.  S: Seen in f/u for shock due to gallstone pancreatitis.   The patient has been weaned off pressors. She is pretty weak but otherwise doing well.  Taking in ice chips.   O: Blood pressure (!) 145/90, pulse 99, temperature 98 F (36.7 C), temperature source Oral, resp. rate 19, height 5\' 1"  (1.549 m), weight 98.6 kg, SpO2 96 %.  On exam this is a chronically ill-appearing obese woman in no acute distress.  Lungs have crackles  Ext 1+ edema bilaterally  Fair insight and global weakness  Abd soft, mild TTP  Labs show mildly improved creatinine of 2.6 from 2.9, mild anion gap acidosis, hemoglobin stable, platelets are down, chest x-ray shows bilateral pleural effusions, no consolidations, perhaps mild interstitial edema   A:  # Septic shock secondary to pancreatitis with strep and E coli bacteremia, presumed etiology is gallstone but no ductal dilation seen so no intervention needed at this time.  Pressors now off. # Inflammatory transaminitis related to above, improving # Hypoxemia related to third spacing, atelectasis, obesity # AKI on CKD, slightly improved today, made 1.2L urine yesterday # Thrombocytopenia reactive-trend, heparin on hold, low 4T score  P:  - Unasyn, duration TBD, f/u culture data - Continue supportive care, mobilization and IS will be most important going forward - Continue IVF, if runs into more trouble with breathing would probably need to both diurese and give fluid in this unusual circumstance - Lap chole at some point either here or as OP - Ice chips for now, diet challenge at later date - f/u lactate and lipase although unlikely to change management, part  of lactate explained by acute liver injury - PT consult - Stable for progressive care, she is not out of woods yet though, much of her recovery will depend on how much she pushes herself to gain strength back.  Appreciate hospitalist taking over as primary - Probably warrants sleep study as OP  Erskine Emery MD

## 2019-05-14 NOTE — Progress Notes (Signed)
Patients sister called this evening to get an update on patient. Patient was asleep at the time her sister called, RN updated sister.

## 2019-05-14 NOTE — Plan of Care (Signed)

## 2019-05-15 DIAGNOSIS — J9691 Respiratory failure, unspecified with hypoxia: Secondary | ICD-10-CM

## 2019-05-15 DIAGNOSIS — E785 Hyperlipidemia, unspecified: Secondary | ICD-10-CM

## 2019-05-15 DIAGNOSIS — Z1611 Resistance to penicillins: Secondary | ICD-10-CM

## 2019-05-15 DIAGNOSIS — G5690 Unspecified mononeuropathy of unspecified upper limb: Secondary | ICD-10-CM

## 2019-05-15 DIAGNOSIS — Z905 Acquired absence of kidney: Secondary | ICD-10-CM

## 2019-05-15 DIAGNOSIS — I129 Hypertensive chronic kidney disease with stage 1 through stage 4 chronic kidney disease, or unspecified chronic kidney disease: Secondary | ICD-10-CM

## 2019-05-15 DIAGNOSIS — Z79899 Other long term (current) drug therapy: Secondary | ICD-10-CM

## 2019-05-15 DIAGNOSIS — K7200 Acute and subacute hepatic failure without coma: Secondary | ICD-10-CM | POA: Diagnosis present

## 2019-05-15 DIAGNOSIS — A419 Sepsis, unspecified organism: Secondary | ICD-10-CM | POA: Diagnosis present

## 2019-05-15 DIAGNOSIS — R6521 Severe sepsis with septic shock: Secondary | ICD-10-CM

## 2019-05-15 DIAGNOSIS — N184 Chronic kidney disease, stage 4 (severe): Secondary | ICD-10-CM

## 2019-05-15 DIAGNOSIS — A4151 Sepsis due to Escherichia coli [E. coli]: Principal | ICD-10-CM

## 2019-05-15 DIAGNOSIS — R740 Nonspecific elevation of levels of transaminase and lactic acid dehydrogenase [LDH]: Secondary | ICD-10-CM

## 2019-05-15 DIAGNOSIS — R7881 Bacteremia: Secondary | ICD-10-CM | POA: Diagnosis present

## 2019-05-15 DIAGNOSIS — K819 Cholecystitis, unspecified: Secondary | ICD-10-CM

## 2019-05-15 LAB — CBC WITH DIFFERENTIAL/PLATELET
Abs Immature Granulocytes: 0.07 10*3/uL (ref 0.00–0.07)
Basophils Absolute: 0.1 10*3/uL (ref 0.0–0.1)
Basophils Relative: 1 %
Eosinophils Absolute: 0.2 10*3/uL (ref 0.0–0.5)
Eosinophils Relative: 3 %
HCT: 28.5 % — ABNORMAL LOW (ref 36.0–46.0)
Hemoglobin: 9.1 g/dL — ABNORMAL LOW (ref 12.0–15.0)
Immature Granulocytes: 1 %
Lymphocytes Relative: 8 %
Lymphs Abs: 0.7 10*3/uL (ref 0.7–4.0)
MCH: 31.5 pg (ref 26.0–34.0)
MCHC: 31.9 g/dL (ref 30.0–36.0)
MCV: 98.6 fL (ref 80.0–100.0)
Monocytes Absolute: 0.4 10*3/uL (ref 0.1–1.0)
Monocytes Relative: 4 %
Neutro Abs: 7.5 10*3/uL (ref 1.7–7.7)
Neutrophils Relative %: 83 %
Platelets: 79 10*3/uL — ABNORMAL LOW (ref 150–400)
RBC: 2.89 MIL/uL — ABNORMAL LOW (ref 3.87–5.11)
RDW: 14.8 % (ref 11.5–15.5)
WBC: 8.9 10*3/uL (ref 4.0–10.5)
nRBC: 0.4 % — ABNORMAL HIGH (ref 0.0–0.2)

## 2019-05-15 LAB — COMPREHENSIVE METABOLIC PANEL
ALT: 400 U/L — ABNORMAL HIGH (ref 0–44)
AST: 266 U/L — ABNORMAL HIGH (ref 15–41)
Albumin: 2.2 g/dL — ABNORMAL LOW (ref 3.5–5.0)
Alkaline Phosphatase: 145 U/L — ABNORMAL HIGH (ref 38–126)
Anion gap: 12 (ref 5–15)
BUN: 29 mg/dL — ABNORMAL HIGH (ref 8–23)
CO2: 21 mmol/L — ABNORMAL LOW (ref 22–32)
Calcium: 8.6 mg/dL — ABNORMAL LOW (ref 8.9–10.3)
Chloride: 103 mmol/L (ref 98–111)
Creatinine, Ser: 2.38 mg/dL — ABNORMAL HIGH (ref 0.44–1.00)
GFR calc Af Amer: 24 mL/min — ABNORMAL LOW (ref >60)
GFR calc non Af Amer: 21 mL/min — ABNORMAL LOW (ref >60)
Glucose, Bld: 88 mg/dL (ref 70–99)
Potassium: 4.1 mmol/L (ref 3.5–5.1)
Sodium: 136 mmol/L (ref 135–145)
Total Bilirubin: 4 mg/dL — ABNORMAL HIGH (ref 0.3–1.2)
Total Protein: 5.3 g/dL — ABNORMAL LOW (ref 6.5–8.1)

## 2019-05-15 LAB — MAGNESIUM: Magnesium: 1.1 mg/dL — ABNORMAL LOW (ref 1.7–2.4)

## 2019-05-15 LAB — PHOSPHORUS: Phosphorus: 3.6 mg/dL (ref 2.5–4.6)

## 2019-05-15 LAB — LIPASE, BLOOD: Lipase: 33 U/L (ref 11–51)

## 2019-05-15 MED ORDER — OXYCODONE HCL 5 MG PO TABS
5.0000 mg | ORAL_TABLET | Freq: Four times a day (QID) | ORAL | Status: DC | PRN
  Administered 2019-05-15 – 2019-05-18 (×2): 5 mg via ORAL
  Filled 2019-05-15 (×2): qty 1

## 2019-05-15 MED ORDER — MAGNESIUM SULFATE 2 GM/50ML IV SOLN
2.0000 g | Freq: Once | INTRAVENOUS | Status: AC
  Administered 2019-05-15: 2 g via INTRAVENOUS
  Filled 2019-05-15: qty 50

## 2019-05-15 MED ORDER — FUROSEMIDE 10 MG/ML IJ SOLN
20.0000 mg | Freq: Once | INTRAMUSCULAR | Status: AC
  Administered 2019-05-15: 20 mg via INTRAVENOUS
  Filled 2019-05-15: qty 2

## 2019-05-15 MED ORDER — SODIUM CHLORIDE 0.9 % IV SOLN
2.0000 g | INTRAVENOUS | Status: AC
  Administered 2019-05-15 – 2019-05-21 (×7): 2 g via INTRAVENOUS
  Filled 2019-05-15 (×7): qty 2

## 2019-05-15 NOTE — Evaluation (Signed)
Physical Therapy Evaluation Patient Details Name: [name] MRN: [id number] DOB: 07/16/1956 Today's Date: 05/15/2019   History of Present Illness  [name] is a 63 y.o female with a PMHx of HTN, HLD, axonal neuropathy, and left renal nephrectomy who is currently receiving management for septic shock due to infection in the setting of cholecystitis and pancreatitis.   Clinical Impression   Pt admitted with above diagnosis. Pt currently with functional limitations due to the deficits listed below (see PT Problem List). 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       Follow Up Recommendations Home health PT;Supervision/Assistance - 24 hour    Equipment Recommendations  Rolling walker with 5" wheels;3in1 (PT)    Recommendations for Other Services OT consult     Precautions / Restrictions Precautions Precautions: Fall Precaution Comments: watch O2 sats on room air Restrictions Weight Bearing Restrictions: No      Mobility  Bed Mobility                  Transfers Overall transfer level: Needs assistance Equipment used: None Transfers: Sit to/from Stand Sit to Stand: Mod assist         General transfer comment: Light mod assist to power up and steady; cues for pushing up from seated surface  Ambulation/Gait Ambulation/Gait assistance: Min assist Gait Distance (Feet): 25 Feet Assistive device: 1 person hand held assist Gait Pattern/deviations: Step-through pattern;Decreased step length - right;Decreased step length - left;Decreased stride length Gait velocity: slowed   General Gait Details: Cues to self-monitor for activity tolerance; tending to reach out for UE support  Stairs             Wheelchair Mobility    Modified Rankin (Stroke Patients Only)       Balance Overall balance assessment: Needs assistance           Standing balance-Leahy Scale: Poor                               Pertinent Vitals/Pain Pain Assessment: No/denies pain    Home Living Family/patient expects to be discharged to:: Private residence Living Arrangements: Children Available Help at Discharge: Family Type of Home: House Home Access: Stairs to enter Entrance Stairs-Rails: Psychiatric nurse of Steps: 3 Home Layout: One level        Prior Function Level of Independence: Independent         Comments: Works as a Educational psychologist        Extremity/Trunk Assessment   Upper Extremity Assessment Upper Extremity Assessment: Generalized weakness    Lower Extremity Assessment Lower Extremity Assessment: Generalized weakness       Communication   Communication: No difficulties  Cognition Arousal/Alertness: Awake/alert Behavior During Therapy: WFL for tasks assessed/performed Overall Cognitive Status: Within Functional Limits for tasks assessed                                        General Comments General comments (skin integrity, edema, etc.): initiated session with pt on Room air (she is not needing supplemental O2 at baseline)  and sats dropeed quickly to 85%; restarted supplemental O2 and sats increased to 95% with activitiy    Exercises     Assessment/Plan    PT Assessment Patient needs continued PT services  PT Problem List Decreased strength;Decreased activity tolerance;Decreased balance;Decreased mobility;Decreased coordination;Decreased knowledge of use of DME;Decreased knowledge of precautions;Cardiopulmonary status limiting activity       PT Treatment Interventions DME instruction;Gait training;Stair training;Functional mobility training;Therapeutic activities;Therapeutic  exercise;Patient/family education;Balance training    PT Goals (Current goals can be found in the Care Plan section)  Acute Rehab PT Goals Patient Stated Goal: to be able to walk better PT Goal Formulation: With patient Time For Goal Achievement: 05/29/19 Potential to Achieve Goals: Good    Frequency Min 3X/week   Barriers to discharge        Co-evaluation               AM-PAC PT "6 Clicks" Mobility  Outcome Measure Help needed turning from your back to your side while in a flat bed without using bedrails?: A Little Help needed moving from lying on your back to sitting on the side of a flat bed without using bedrails?: A Little Help needed moving to and from a bed to a chair (including a wheelchair)?: A Little Help needed standing up from a chair using your arms (e.g., wheelchair or bedside chair)?: A Little Help needed to walk in hospital room?: A Little Help needed climbing 3-5 steps with a railing? : A Lot 6 Click Score: 17    End of Session Equipment Utilized During Treatment: Gait belt;Oxygen Activity Tolerance: Patient tolerated treatment well Patient left: in chair;with call bell/phone within reach Nurse Communication: Mobility status PT Visit Diagnosis: Unsteadiness on feet (R26.81);Other abnormalities of gait and mobility (R26.89)    Time: 4718-5501 PT Time Calculation (min) (ACUTE ONLY): 22 min   Charges:   PT Evaluation $PT Eval Moderate Complexity: 1 Mod          Roney [place], [place]  Acute Rehabilitation Services Pager [phone] Office [phone]   Colletta [place] 05/15/2019, 4:46 PM

## 2019-05-15 NOTE — Progress Notes (Addendum)
Subjective: Transferred from the ICU yesterday.  Patient was examined at the bedside this morning. She did not complain of any events overnight. She endorses continuation of her diffuse abdominal pain and upper-middle back pain which she rates as 4/10. She denies worsening shortness of breath or productive cough although she slept in an upright position.  After having a brief conversation regarding her current liver and pancreatic values, we also discussed our current plan of care with her. It seems she is ready to try a clear liquid diet. We also encouraged her to use her incentive spirometer.   Patient says she spoke to her son yesterday and that he is updated on her current condition.   Objective:  Vital signs in last 24 hours: Vitals:   05/14/19 2358 05/15/19 0300 05/15/19 0824 05/15/19 1132  BP:  130/77 (!) 144/76 136/83  Pulse: (!) 101 (!) 108 (!) 102 98  Resp: 16 18 20 13   Temp:  98.3 F (36.8 C) 98 F (36.7 C) 98.2 F (36.8 C)  TempSrc:  Oral Oral Oral  SpO2: 94% 94% 93% 97%  Weight:      Height:        Intake/Output Summary (Last 24 hours) at 05/15/2019 1148 Last data filed at 05/15/2019 1000 Gross per 24 hour  Intake 773.39 ml  Output 1600 ml  Net -826.61 ml   Physical Examination: General: Moderate distress, awake, obese  HEENT: MMM. Trachea midline.  CV: RRR. Normal S1, S2. No M/R/G Pulm: Mildly increased work of breathing. Bilateral rales in lower lobes Abd: Diffuse tenderness to light palpation. Normal active bowel sounds Extremities: Peripheral edema in both lower extremities. 2+ bilateral dorsalis pedis pulse  Neuro: Alert, speech normal, strength and sensation intact and symmetric Psych: Normal affect  Assessment/Plan:  Principal Problem:   Gallstone pancreatitis Active Problems:   E coli bacteremia   Shock liver   Septic shock ([place])  Ms. Hogland is a 63 y.o female with a PMHx of HTN, HLD, axonal neuropathy, and left renal nephrectomy who is  currently receiving management for septic shock due to GNR in the setting of cholecystitis and pancreatitis.   Septic shock/bacteremia/hypoxic respiratory failure: Patient recently had care transferred from ICU. She was treated for persistent hypotension and lactic acidosis despite receiving 6L IVF on presentation. Blood cultures from (07/09) detected E.Coli resistant to ampicillin/ampicillin-sulbactam/TMP-SMX. Hypoxemia likely related to fluid overload relating to fluid resuscitation (>9L) -cultures revealed Unasyn resistance, had been transitioned from zosyn to unasyn, will switch to ceftriaxone 2g IV q24 -start 20 mg IV lasix -discontinue IV fluids  -continue O2 support to maintain O2 sat >92% -encourage incentive spirometry  Acute Cholecystitis/Pancreatitis: Patient underwent a MRCP 07/10 that demonstrated pancreatitis and cholelithiasis, no complications of pancreatitis - transition from NPO to clear liquid diet, monitor patient's diet tolerance - continue to control pain with 5 mg oxyCodone PRN every 6 hours - consult surgery regarding possible cholecystectomy in the next few days  Elevated transaminases: Likely due to shock liver, though cholangitis also possible. Improving. - continue trending  Hypomagnesemia: Labs on 07/11 demonstrated a magnesium of 1.1.  - replete magnesium w/ additional 2g magnesium sulfate this afternoon - monitor on telemetry    Hypertension/Hyperlipidemia: - hold home hypertensive/hyperlipidemia medications in the setting of patient's current BP and transaminase trend  CKD stage IV: stable - monitor creatinine  - hold allopurinol if creatinine spikes   DVT Prophylaxis: Platelets downtrended in the ICU and  Heparin was held. Currently, Plt levels are 79.  We will continue to monitor.  - continue SCDs  Dispo: Requires ongoing stepdown care for gallstone pancreatitis complicated by E coli bacteremia and resolving septic shock.   LOS: 3 days   BrinsonMarijean Bravo, Medical Student 05/15/2019, 11:48 AM

## 2019-05-16 DIAGNOSIS — A419 Sepsis, unspecified organism: Secondary | ICD-10-CM

## 2019-05-16 DIAGNOSIS — I10 Essential (primary) hypertension: Secondary | ICD-10-CM

## 2019-05-16 LAB — COMPREHENSIVE METABOLIC PANEL
ALT: 259 U/L — ABNORMAL HIGH (ref 0–44)
AST: 87 U/L — ABNORMAL HIGH (ref 15–41)
Albumin: 2.2 g/dL — ABNORMAL LOW (ref 3.5–5.0)
Alkaline Phosphatase: 197 U/L — ABNORMAL HIGH (ref 38–126)
Anion gap: 11 (ref 5–15)
BUN: 30 mg/dL — ABNORMAL HIGH (ref 8–23)
CO2: 26 mmol/L (ref 22–32)
Calcium: 8.9 mg/dL (ref 8.9–10.3)
Chloride: 98 mmol/L (ref 98–111)
Creatinine, Ser: 2.29 mg/dL — ABNORMAL HIGH (ref 0.44–1.00)
GFR calc Af Amer: 25 mL/min — ABNORMAL LOW (ref >60)
GFR calc non Af Amer: 22 mL/min — ABNORMAL LOW (ref >60)
Glucose, Bld: 101 mg/dL — ABNORMAL HIGH (ref 70–99)
Potassium: 3.6 mmol/L (ref 3.5–5.1)
Sodium: 135 mmol/L (ref 135–145)
Total Bilirubin: 2.5 mg/dL — ABNORMAL HIGH (ref 0.3–1.2)
Total Protein: 5.6 g/dL — ABNORMAL LOW (ref 6.5–8.1)

## 2019-05-16 LAB — CBC WITH DIFFERENTIAL/PLATELET
Abs Immature Granulocytes: 0.24 10*3/uL — ABNORMAL HIGH (ref 0.00–0.07)
Basophils Absolute: 0.1 10*3/uL (ref 0.0–0.1)
Basophils Relative: 0 %
Eosinophils Absolute: 0.2 10*3/uL (ref 0.0–0.5)
Eosinophils Relative: 2 %
HCT: 29.2 % — ABNORMAL LOW (ref 36.0–46.0)
Hemoglobin: 9.6 g/dL — ABNORMAL LOW (ref 12.0–15.0)
Immature Granulocytes: 2 %
Lymphocytes Relative: 11 %
Lymphs Abs: 1.2 10*3/uL (ref 0.7–4.0)
MCH: 31.7 pg (ref 26.0–34.0)
MCHC: 32.9 g/dL (ref 30.0–36.0)
MCV: 96.4 fL (ref 80.0–100.0)
Monocytes Absolute: 0.6 10*3/uL (ref 0.1–1.0)
Monocytes Relative: 6 %
Neutro Abs: 9 10*3/uL — ABNORMAL HIGH (ref 1.7–7.7)
Neutrophils Relative %: 79 %
Platelets: 85 10*3/uL — ABNORMAL LOW (ref 150–400)
RBC: 3.03 MIL/uL — ABNORMAL LOW (ref 3.87–5.11)
RDW: 14.6 % (ref 11.5–15.5)
WBC: 11.3 10*3/uL — ABNORMAL HIGH (ref 4.0–10.5)
nRBC: 0.3 % — ABNORMAL HIGH (ref 0.0–0.2)

## 2019-05-16 LAB — CULTURE, BLOOD (ROUTINE X 2): Special Requests: ADEQUATE

## 2019-05-16 LAB — MAGNESIUM: Magnesium: 1.4 mg/dL — ABNORMAL LOW (ref 1.7–2.4)

## 2019-05-16 LAB — PHOSPHORUS: Phosphorus: 3.4 mg/dL (ref 2.5–4.6)

## 2019-05-16 MED ORDER — FUROSEMIDE 10 MG/ML IJ SOLN
20.0000 mg | Freq: Once | INTRAMUSCULAR | Status: AC
  Administered 2019-05-16: 12:00:00 20 mg via INTRAVENOUS
  Filled 2019-05-16: qty 2

## 2019-05-16 MED ORDER — MAGNESIUM SULFATE 2 GM/50ML IV SOLN
2.0000 g | Freq: Once | INTRAVENOUS | Status: AC
  Administered 2019-05-16: 09:00:00 2 g via INTRAVENOUS
  Filled 2019-05-16: qty 50

## 2019-05-16 NOTE — Care Management Important Message (Signed)
Important Message  Patient Details  Name: [name] MRN: [id number] Date of Birth: 08/15/1956   Medicare Important Message Given:  Yes     Memory Argue 05/16/2019, 3:35 PM    PATIENT  HAS SIGNED

## 2019-05-16 NOTE — Progress Notes (Signed)
Subjective:  Patient was examined by the bedside this morning. She was sleeping comfortably in an upright position prior to our arrival. Endorses some continuing pain in the right upper quadrant, left upper quadrant and mid-upper back region. Her back pain seems to be bothering her more than her abdominal pain. No shortness of breath or cough while at rest. She tolerated her clear liquid diet well and did not complain of pain following meals. Patient has not had a bowel movement for several days. Lastly, patient complained of some new tremors in her left arm.  During her PT session she felt weak and short of breath. In addition, she confirms she was able to use her incentive spirometer 3 times yesterday.   Patient is okay with [place] calling her son for an update on her current condition.   Objective:  Vital signs in last 24 hours: Vitals:   05/15/19 2000 05/15/19 2300 05/16/19 0300 05/16/19 0731  BP:  99/74 125/70 134/79  Pulse:  (!) 101 98 (!) 102  Resp:  12 15 18   Temp: 97.9 F (36.6 C) 98.2 F (36.8 C) 98.2 F (36.8 C) 98.9 F (37.2 C)  TempSrc: Oral Oral Oral Oral  SpO2:  97% 99% 95%  Weight:      Height:        Intake/Output Summary (Last 24 hours) at 05/16/2019 1027 Last data filed at 05/16/2019 0929 Gross per 24 hour  Intake 0 ml  Output 1000 ml  Net -1000 ml   Physical Examination: General: Moderate distress, obese, somnolent HEENT: Dry mucus membranes. Trachea Midline.  CV: RRR. Normal S1, S2. No M/R/G Pulm: Increased work of breathing. Bilateral rales & wheezes heard in lower lung fields bilaterally Abd: Tenderness to light palpation. Pain radiates to mid-upper back. NABS. Neuro: Alert, normal speech, sensation intact  Psych: Normal affect Extremities: Warm in all extremities. Edema in lower extremities bilaterally. 2+ dorsalis pedis, posterior tibialis pulse    Assessment/Plan:  Principal Problem:   Gallstone pancreatitis Active Problems:   E coli  bacteremia   Shock liver   Septic shock ([facility])  [name] is a 63 y.o female with a PMHx of HTN, HLD, axonal neuropathy, and left renal nephrectomy who is currently receiving management for septic shock due to GNR in the setting of cholecystitis and pancreatitis.  Septic shock/bacteremia/hypoxic respiratory failure: Blood cultures from (07/09) detected E.Coli resistant to ampicillin/ampicillin-sulbactam/TMP-SMX. Hypoxemia likely related to fluid overload relating to fluid resuscitation (>9L) - continue 2g ceftriaxone (day 3 of antibiotics plan for 5 more days) - continue one time 20 mg IV lasix, monitor creatinine due to CKD - hold IV fluids - continue to encourage incentive spirometry/ O2 sat goal > 92%  Acute Cholecystitis/Pancreatitis: Patient underwent a MRCP 07/10 that demonstrated pancreatitis and cholelithiasis, no complications of pancreatitis. Surgery consult (07/13) noted patient could undergo cholecystectomy if she continues to improve or may need to have procedure completed in outpatient setting. - monitor tolerance on clear liquid diet - nutrition consult    Elevated Transaminases: Labs continuing to trend downward. Her AST has decreased to 87 from (266), ALT decreased to 259 (from 400) and bilirubin decreased to 2.5 (from 4.0) - continue to trend  Hypomagnesemia: On 07/13, Magnesium improved to 1.4 (from 1.1 on 07/12). - continue 2 g magnesium sulfate  - monitor on telemetry  DVT Prophylaxis: Platelets remain low at 85 on 07/13.  - continue with SCDs  Dispo: Will require additional time in stepdown care due to septic shock  due to E. Coli bacteremia in the setting of gallstone pancreatitis.     LOS: 4 days   BrinsonMarijean Wallace, Medical Student 05/16/2019, 10:27 AM

## 2019-05-16 NOTE — Progress Notes (Signed)
Patient ID: [name], female   DOB: [birth year], 63 y.o.   MRN: [id number]       Subjective: Patient seems sedated from pain meds although she states she hasn't had any since last night.  Denies nausea or abdominal pain this morning.  Ate some CLD yesterday with no increase in symptoms.  Objective: Vital signs in last 24 hours: Temp:  [97 F (36.1 C)-98.9 F (37.2 C)] 98.9 F (37.2 C) (07/13 0731) Pulse Rate:  [98-106] 102 (07/13 0731) Resp:  [12-25] 18 (07/13 0731) BP: (99-167)/(70-95) 134/79 (07/13 0731) SpO2:  [94 %-99 %] 95 % (07/13 0731) Last BM Date: 05/12/19  Intake/Output from previous day: 07/12 0701 - 07/13 0700 In: 200 [P.O.:200] Out: 1100 [Urine:1100] Intake/Output this shift: No intake/output data recorded.  PE: Gen: lethargic, on O2 Heart: regular, mildly tach Lungs: CTAB, [place] in place with O2 running Abd: soft, seems nontender today, +BS, obese  Lab Results:  Recent Labs    05/15/19 0217 05/16/19 0538  WBC 8.9 11.3*  HGB 9.1* 9.6*  HCT 28.5* 29.2*  PLT 79* 85*   BMET Recent Labs    05/15/19 0217 05/16/19 0538  NA 136 135  K 4.1 3.6  CL 103 98  CO2 21* 26  GLUCOSE 88 101*  BUN 29* 30*  CREATININE 2.38* 2.29*  CALCIUM 8.6* 8.9   PT/INR No results for input(s): LABPROT, INR in the last 72 hours. CMP     Component Value Date/Time   NA 135 05/16/2019 0538   K 3.6 05/16/2019 0538   CL 98 05/16/2019 0538   CO2 26 05/16/2019 0538   GLUCOSE 101 (H) 05/16/2019 0538   BUN 30 (H) 05/16/2019 0538   CREATININE 2.29 (H) 05/16/2019 0538   CALCIUM 8.9 05/16/2019 0538   PROT 5.6 (L) 05/16/2019 0538   ALBUMIN 2.2 (L) 05/16/2019 0538   AST 87 (H) 05/16/2019 0538   ALT 259 (H) 05/16/2019 0538   ALKPHOS 197 (H) 05/16/2019 0538   BILITOT 2.5 (H) 05/16/2019 0538   GFRNONAA 22 (L) 05/16/2019 0538   GFRAA 25 (L) 05/16/2019 0538   Lipase     Component Value Date/Time   LIPASE 33 05/15/2019 0217       Studies/Results: No results found.   Anti-infectives: Anti-infectives (From admission, onward)   Start     Dose/Rate Route Frequency Ordered Stop   05/15/19 1230  cefTRIAXone (ROCEPHIN) 2 g in sodium chloride 0.9 % 100 mL IVPB     2 g 200 mL/hr over 30 Minutes Intravenous Every 24 hours 05/15/19 1153     05/13/19 1200  Ampicillin-Sulbactam (UNASYN) 3 g in sodium chloride 0.9 % 100 mL IVPB  Status:  Discontinued     3 g 200 mL/hr over 30 Minutes Intravenous Every 12 hours 05/13/19 1000 05/15/19 1153   05/13/19 0600  piperacillin-tazobactam (ZOSYN) IVPB 3.375 g  Status:  Discontinued     3.375 g 12.5 mL/hr over 240 Minutes Intravenous Every 8 hours 05/12/19 2332 05/13/19 1000   05/12/19 2215  Ampicillin-Sulbactam (UNASYN) 3 g in sodium chloride 0.9 % 100 mL IVPB  Status:  Discontinued     3 g 200 mL/hr over 30 Minutes Intravenous Every 6 hours 05/12/19 2204 05/12/19 2331   05/12/19 2215  vancomycin (VANCOCIN) 2,000 mg in sodium chloride 0.9 % 500 mL IVPB  Status:  Discontinued     2,000 mg 250 mL/hr over 120 Minutes Intravenous  Once 05/12/19 2212 05/12/19 2331   05/12/19 2200  piperacillin-tazobactam (  ZOSYN) IVPB 3.375 g  Status:  Discontinued     3.375 g 12.5 mL/hr over 240 Minutes Intravenous Every 8 hours 05/12/19 1946 05/12/19 2204   05/12/19 1909  vancomycin variable dose per unstable renal function (pharmacist dosing)  Status:  Discontinued      Does not apply See admin instructions 05/12/19 1910 05/12/19 1910   05/12/19 1300  piperacillin-tazobactam (ZOSYN) IVPB 3.375 g     3.375 g 100 mL/hr over 30 Minutes Intravenous  Once 05/12/19 1247 05/12/19 1450       Assessment/Plan H/o CVA Solitary kidney Thrombocytopenia - improving HTN Bacteremia - ESCHERICHIA COLI  STREPTOCOCCUS GALLOLYTICUS   Gallstone pancreatitis -with significant pancreatitis on recent imaging.  Tolerating CLD but not ready yet for OR for lap chole.  May be ready later this week pending her improvement.  If she is still not ready yet for  surgery, it may be delayed until she is an outpatient to allow her pancreatitis to continue to resolve.   -cont CLD. -cont to follow  FEN - CLD, IVFs VTE - ok for chemical prophylaxis when ok with primary service. plts are over 50K, 85K today ID - Rocephin   LOS: 4 days    Henreitta Cea , [facility] Surgery 05/16/2019, 8:35 AM Pager: [phone]

## 2019-05-17 LAB — CBC
HCT: 26.9 % — ABNORMAL LOW (ref 36.0–46.0)
Hemoglobin: 8.9 g/dL — ABNORMAL LOW (ref 12.0–15.0)
MCH: 31.8 pg (ref 26.0–34.0)
MCHC: 33.1 g/dL (ref 30.0–36.0)
MCV: 96.1 fL (ref 80.0–100.0)
Platelets: 75 10*3/uL — ABNORMAL LOW (ref 150–400)
RBC: 2.8 MIL/uL — ABNORMAL LOW (ref 3.87–5.11)
RDW: 15 % (ref 11.5–15.5)
WBC: 9.7 10*3/uL (ref 4.0–10.5)
nRBC: 0.5 % — ABNORMAL HIGH (ref 0.0–0.2)

## 2019-05-17 LAB — COMPREHENSIVE METABOLIC PANEL
ALT: 150 U/L — ABNORMAL HIGH (ref 0–44)
AST: 36 U/L (ref 15–41)
Albumin: 2 g/dL — ABNORMAL LOW (ref 3.5–5.0)
Alkaline Phosphatase: 187 U/L — ABNORMAL HIGH (ref 38–126)
Anion gap: 14 (ref 5–15)
BUN: 27 mg/dL — ABNORMAL HIGH (ref 8–23)
CO2: 26 mmol/L (ref 22–32)
Calcium: 8.8 mg/dL — ABNORMAL LOW (ref 8.9–10.3)
Chloride: 95 mmol/L — ABNORMAL LOW (ref 98–111)
Creatinine, Ser: 2.33 mg/dL — ABNORMAL HIGH (ref 0.44–1.00)
GFR calc Af Amer: 25 mL/min — ABNORMAL LOW (ref >60)
GFR calc non Af Amer: 21 mL/min — ABNORMAL LOW (ref >60)
Glucose, Bld: 74 mg/dL (ref 70–99)
Potassium: 3.5 mmol/L (ref 3.5–5.1)
Sodium: 135 mmol/L (ref 135–145)
Total Bilirubin: 1.9 mg/dL — ABNORMAL HIGH (ref 0.3–1.2)
Total Protein: 5.4 g/dL — ABNORMAL LOW (ref 6.5–8.1)

## 2019-05-17 LAB — MAGNESIUM: Magnesium: 1.5 mg/dL — ABNORMAL LOW (ref 1.7–2.4)

## 2019-05-17 MED ORDER — MAGNESIUM SULFATE 2 GM/50ML IV SOLN
2.0000 g | Freq: Once | INTRAVENOUS | Status: AC
  Administered 2019-05-17: 17:00:00 2 g via INTRAVENOUS
  Filled 2019-05-17: qty 50

## 2019-05-17 MED ORDER — FUROSEMIDE 10 MG/ML IJ SOLN
20.0000 mg | Freq: Every day | INTRAMUSCULAR | Status: DC
  Administered 2019-05-17 – 2019-05-18 (×2): 20 mg via INTRAVENOUS
  Filled 2019-05-17 (×2): qty 2

## 2019-05-17 MED ORDER — PRO-STAT SUGAR FREE PO LIQD
30.0000 mL | Freq: Two times a day (BID) | ORAL | Status: DC
  Administered 2019-05-17 – 2019-05-21 (×7): 30 mL via ORAL
  Filled 2019-05-17 (×10): qty 30

## 2019-05-17 MED ORDER — BOOST / RESOURCE BREEZE PO LIQD CUSTOM
1.0000 | Freq: Three times a day (TID) | ORAL | Status: DC
  Administered 2019-05-17 – 2019-05-21 (×11): 1 via ORAL

## 2019-05-17 MED ORDER — IPRATROPIUM-ALBUTEROL 0.5-2.5 (3) MG/3ML IN SOLN
3.0000 mL | Freq: Four times a day (QID) | RESPIRATORY_TRACT | Status: DC | PRN
  Administered 2019-05-17 (×2): 3 mL via RESPIRATORY_TRACT
  Filled 2019-05-17 (×2): qty 3

## 2019-05-17 MED ORDER — GUAIFENESIN 200 MG PO TABS
200.0000 mg | ORAL_TABLET | ORAL | Status: DC | PRN
  Administered 2019-05-17 – 2019-05-22 (×7): 200 mg via ORAL
  Filled 2019-05-17 (×7): qty 1

## 2019-05-17 NOTE — Progress Notes (Signed)
Subjective:  Ms. Kisling was monitored while sitting upright in a chair this morning. This is hospital day 6. Per nurse, patient improved overnight and seems less "groggy" and more alert. She was on 5 L of O2 throughout the night. Today, patient denies lingering upper abdominal pain and back pain. She did not use any of her PRN oxycodone last night. At rest she does not feel short of breath.   She was able to ambulate and use her incentive spirometer evening of 07/13. However, she was short of breath following both of these activities.   Her last colonoscopy was either 2017 or 2018. From what she can remember, the colonoscopy demonstrated no abnormalities.   Patient is fine with [place] updating her son on her current condition.  Objective:  Vital signs in last 24 hours: Vitals:   05/16/19 2348 05/17/19 0400 05/17/19 0748 05/17/19 1044  BP: 125/78 122/74 (!) 178/85 (!) 165/82  Pulse: (!) 109 95 90 (!) 102  Resp: (!) 26 19 (!) 32 (!) 32  Temp: 99.1 F (37.3 C) 99 F (37.2 C) 97.6 F (36.4 C) 98 F (36.7 C)  TempSrc: Oral Oral Oral Oral  SpO2: 95% 95% 92% 96%  Weight:  95.4 kg    Height:        Intake/Output Summary (Last 24 hours) at 05/17/2019 1351 Last data filed at 05/17/2019 0800 Gross per 24 hour  Intake 360 ml  Output 2600 ml  Net -2240 ml   Physical Examination: Gen: Somnolent. Alert. Obese. No acute distress at this time HEENT: Dry mucus membranes. No jugular venous distension.Trachea Midline.  CV: RRR. Normal S1, S2. Murmur heard at right sternal border. No rubs or gallops.  Pulm: Bilateral rales on examination in lower lung regions. Expiratory wheezes heard throughout Abd: NABS. No tenderness to palpation. Extremities: Peripheral edema bilaterally LE (patient claims this is her normal). 2+ radial and dorsalis pedis pulse bilaterally  Psych: Euthymic. Normal Affect  Assessment/Plan:  Principal Problem:   Gallstone pancreatitis Active Problems:   E coli  bacteremia   Shock liver   Septic shock (HCC)   Sepsis without acute organ dysfunction [facility])  Ms. Hiltz is a 63 y.o female with a PMHx of HTN, HLD, axonal neuropathy, and left renal nephrectomy who is currently receiving management for septic shock due to E. Coli and Strep. gallolyticus in the setting of cholecystitis and pancreatitis. Patient is currently stable on 5L oxygen with improving pulmonary symptoms and pain control.   Septic shock/bacteremia/hypoxic respiratory failure: Blood cultures from (07/09) detected E.Coli resistant to ampicillin/ampicillin-sulbactam/TMP-SMX. Cultures also detected Strep. gallolyticus with no resistance.  - continue 2g ceftriaxone (day 4 of antibiotics plan for 4 more days) - additional dose of one time 20 mg IV lasix, monitor creatinine due to CKD - hold IV fluids - continue to encourage incentive spirometry/ O2 sat goal > 92% - Work up strep. gallolyticus with TEE, get hold of past colonoscopy records   Acute Cholecystitis/Pancreatitis: Patient underwent a MRCP 07/10 that demonstrated pancreatitis and cholelithiasis, no complications of pancreatitis. Surgery (consult on 07/14) will hold surgical intervention until patient is in outpatient setting to avoid potential complications.  - advance diet as tolerated, patient currently on clear liquid diet  - nutrition consult ordered     Elevated Transaminases: Labs continuing to trend downward. Her AST has decreased to 36 from (87), ALT decreased to 150 (from 259) and bilirubin decreased to 1.9 (from 2.5) - continue to trend as they are slightly increased  Hypomagnesemia: On 07/13, Magnesium improved to 1.5 (from 1.4 on 07/13). - continue 2 g magnesium sulfate  - monitor on telemetry  DVT Prophylaxis: Platelets remain low at 75 on 07/14.  - continue with SCDs  Dispo: Will require additional time in stepdown care due to septic shock due to E. Coli bacteremia in the setting of gallstone pancreatitis.     LOS: 5 days   BrinsonMarijean Bravo, Medical Student 05/17/2019, 1:51 PM

## 2019-05-17 NOTE — Progress Notes (Signed)
Patient ID: [name], female   DOB: 1955-11-29, 63 y.o.   MRN: [id number]       Subjective: Patient more awake today.  Has some abdominal pain with palpation, around a 6.  Tolerating clear liquids which does not make her pain worse or cause nausea.  Objective: Vital signs in last 24 hours: Temp:  [97.6 F (36.4 C)-99.3 F (37.4 C)] 97.6 F (36.4 C) (07/14 0748) Pulse Rate:  [90-110] 90 (07/14 0748) Resp:  [19-32] 32 (07/14 0748) BP: (122-178)/(72-88) 178/85 (07/14 0748) SpO2:  [92 %-95 %] 92 % (07/14 0748) Weight:  [95.4 kg] 95.4 kg (07/14 0400) Last BM Date: 05/15/19  Intake/Output from previous day: 07/13 0701 - 07/14 0700 In: -  Out: 3400 [Urine:3400] Intake/Output this shift: No intake/output data recorded.  PE: Heart: regular Lungs: CTAB Abd: soft, mild epigastric tenderness, +BS, obese  Lab Results:  Recent Labs    05/15/19 0217 05/16/19 0538  WBC 8.9 11.3*  HGB 9.1* 9.6*  HCT 28.5* 29.2*  PLT 79* 85*   BMET Recent Labs    05/15/19 0217 05/16/19 0538  NA 136 135  K 4.1 3.6  CL 103 98  CO2 21* 26  GLUCOSE 88 101*  BUN 29* 30*  CREATININE 2.38* 2.29*  CALCIUM 8.6* 8.9   PT/INR No results for input(s): LABPROT, INR in the last 72 hours. CMP     Component Value Date/Time   NA 135 05/16/2019 0538   K 3.6 05/16/2019 0538   CL 98 05/16/2019 0538   CO2 26 05/16/2019 0538   GLUCOSE 101 (H) 05/16/2019 0538   BUN 30 (H) 05/16/2019 0538   CREATININE 2.29 (H) 05/16/2019 0538   CALCIUM 8.9 05/16/2019 0538   PROT 5.6 (L) 05/16/2019 0538   ALBUMIN 2.2 (L) 05/16/2019 0538   AST 87 (H) 05/16/2019 0538   ALT 259 (H) 05/16/2019 0538   ALKPHOS 197 (H) 05/16/2019 0538   BILITOT 2.5 (H) 05/16/2019 0538   GFRNONAA 22 (L) 05/16/2019 0538   GFRAA 25 (L) 05/16/2019 0538   Lipase     Component Value Date/Time   LIPASE 33 05/15/2019 0217       Studies/Results: No results found.  Anti-infectives: Anti-infectives (From admission, onward)   Start     Dose/Rate Route Frequency Ordered Stop   05/15/19 1230  cefTRIAXone (ROCEPHIN) 2 g in sodium chloride 0.9 % 100 mL IVPB     2 g 200 mL/hr over 30 Minutes Intravenous Every 24 hours 05/15/19 1153 05/21/19 2359   05/13/19 1200  Ampicillin-Sulbactam (UNASYN) 3 g in sodium chloride 0.9 % 100 mL IVPB  Status:  Discontinued     3 g 200 mL/hr over 30 Minutes Intravenous Every 12 hours 05/13/19 1000 05/15/19 1153   05/13/19 0600  piperacillin-tazobactam (ZOSYN) IVPB 3.375 g  Status:  Discontinued     3.375 g 12.5 mL/hr over 240 Minutes Intravenous Every 8 hours 05/12/19 2332 05/13/19 1000   05/12/19 2215  Ampicillin-Sulbactam (UNASYN) 3 g in sodium chloride 0.9 % 100 mL IVPB  Status:  Discontinued     3 g 200 mL/hr over 30 Minutes Intravenous Every 6 hours 05/12/19 2204 05/12/19 2331   05/12/19 2215  vancomycin (VANCOCIN) 2,000 mg in sodium chloride 0.9 % 500 mL IVPB  Status:  Discontinued     2,000 mg 250 mL/hr over 120 Minutes Intravenous  Once 05/12/19 2212 05/12/19 2331   05/12/19 2200  piperacillin-tazobactam (ZOSYN) IVPB 3.375 g  Status:  Discontinued  3.375 g 12.5 mL/hr over 240 Minutes Intravenous Every 8 hours 05/12/19 1946 05/12/19 2204   05/12/19 1909  vancomycin variable dose per unstable renal function (pharmacist dosing)  Status:  Discontinued      Does not apply See admin instructions 05/12/19 1910 05/12/19 1910   05/12/19 1300  piperacillin-tazobactam (ZOSYN) IVPB 3.375 g     3.375 g 100 mL/hr over 30 Minutes Intravenous  Once 05/12/19 1247 05/12/19 1450       Assessment/Plan H/o CVA Solitary kidney Thrombocytopenia - improving HTN Bacteremia - ESCHERICHIA COLI  STREPTOCOCCUS GALLOLYTICUS   Gallstone pancreatitis -with significant pancreatitis on recent imaging.  patient does look better today than yesterday.  -ok to adv diet per medical service as patient is able to tolerate. -she is still quite weak etc.  At this point we will plan to hold on surgical  intervention until she is an outpatient.  She will need to overall improve prior to surgical intervention to avoid potential acute complications. -we will arrange follow up for her.  Please call with questions in the interim.  FEN - CLD, IVFs VTE - ok for chemical prophylaxis when ok with primary service. plts were 85K yesterday, pending today ID - Rocephin Follow up - Dr. Kieth Brightly in about 2 weeks in our office   LOS: 5 days    Henreitta Cea , [facility] 05/17/2019, 7:50 AM Pager: [phone]

## 2019-05-17 NOTE — Progress Notes (Signed)
Desats to 80's on 5L [place] cannula. Coughing on and off with small tan secretions, suction mouth with yankauer. Lungs with few ronchi and wheezing. Placed on 40% vm sat. went up to 98%, MD aware with order.  Breathing treatment  And mucinex given at 1250 pm. Claimed to feel better . Continue to monitor.

## 2019-05-17 NOTE — Progress Notes (Signed)
Physical Therapy Treatment Patient Details Name: [name] MRN: [id number] DOB: 27-Dec-1955 Today's Date: 05/17/2019    History of Present Illness [name] is a 63 y.o female with a PMHx of HTN, HLD, axonal neuropathy, and left renal nephrectomy who is currently receiving management for septic shock due to infection in the setting of cholecystitis and pancreatitis.     PT Comments    Patient seen for mobility progression. Reports she is feeling Wallace following breathing treatment. Use of 5L via [place] during session with SpO2 >/= 95% with all mobility - nursing aware and advised to leave patient on [place]. Performing seated and standing exercises with good tolerance, however does require frequent seated rest breaks. Performing pivot transfers to [facility] with min guard for safety. Will continue to progress.    Follow Up Recommendations  Home health PT;Supervision/Assistance - 24 hour     Equipment Recommendations  Rolling walker with 5" wheels;3in1 (PT)    Recommendations for Other Services OT consult     Precautions / Restrictions Precautions Precautions: Fall Precaution Comments: watch O2 sats Restrictions Weight Bearing Restrictions: No    Mobility  Bed Mobility               General bed mobility comments: up in recliner  Transfers Overall transfer level: Needs assistance Equipment used: Rolling walker (2 wheeled) Transfers: Sit to/from [employer] Transfers Sit to Stand: Min assist;Min guard Stand pivot transfers: Min guard       General transfer comment: generally min guard for safety  Ambulation/Gait             General Gait Details: deferred due to fatigue   Stairs             Wheelchair Mobility    Modified Rankin (Stroke Patients Only)       Balance Overall balance assessment: Needs assistance Sitting-balance support: No upper extremity supported;Feet supported Sitting balance-Leahy Scale: Fair     Standing balance support:  Bilateral upper extremity supported;During functional activity Standing balance-Leahy Scale: Poor                              Cognition Arousal/Alertness: Awake/alert Behavior During Therapy: Flat affect Overall Cognitive Status: Within Functional Limits for tasks assessed                                        Exercises General Exercises - Lower Extremity Ankle Circles/Pumps: AROM;Both;15 reps;Seated Long Arc Quad: AROM;Both;10 reps;Seated Hip Flexion/Marching: AROM;Both;5 reps;Standing(2 sets with seated rest break) Other Exercises Other Exercises: sit to stand 2 x 5 reps - B UE support    General Comments General comments (skin integrity, edema, etc.): use of 5L via [place] during session wtih SpO2 >/= 95% throughout - nursing aware - advised to keep patient on [place]      Pertinent Vitals/Pain Pain Assessment: No/denies pain    Home Living                      Prior Function            PT Goals (current goals can now be found in the care plan section) Acute Rehab PT Goals Patient Stated Goal: to be able to walk Wallace PT Goal Formulation: With patient Time For Goal Achievement: 05/29/19 Potential to Achieve Goals: Good Progress towards PT  goals: Progressing toward goals    Frequency    Min 3X/week      PT Plan Current plan remains appropriate    Co-evaluation              AM-PAC PT "6 Clicks" Mobility   Outcome Measure  Help needed turning from your back to your side while in a flat bed without using bedrails?: A Little Help needed moving from lying on your back to sitting on the side of a flat bed without using bedrails?: A Little Help needed moving to and from a bed to a chair (including a wheelchair)?: A Little Help needed standing up from a chair using your arms (e.g., wheelchair or bedside chair)?: A Little Help needed to walk in hospital room?: A Little Help needed climbing 3-5 steps with a railing? : A Lot 6  Click Score: 17    End of Session Equipment Utilized During Treatment: Oxygen Activity Tolerance: Patient tolerated treatment well Patient left: in chair;with call bell/phone within reach Nurse Communication: Mobility status PT Visit Diagnosis: Unsteadiness on feet (R26.81);Other abnormalities of gait and mobility (R26.89)     Time: 2774-1287 PT Time Calculation (min) (ACUTE ONLY): 23 min  Charges:  $Therapeutic Exercise: 8-22 mins $Therapeutic Activity: 8-22 mins                     Lanney Gins, PT, DPT Supplemental Physical Therapist 05/17/19 2:55 PM Pager: [phone] Office: [phone]

## 2019-05-17 NOTE — Progress Notes (Signed)
Initial Nutrition Assessment  DOCUMENTATION CODES:   Obesity unspecified  INTERVENTION:   - Advance diet as tolerated per medical service (pt requesting "regular food" today)  - RD will monitor for diet advancement and adjust supplement regimen as appropriate  - Boost Breeze po TID, each supplement provides 250 kcal and 9 grams of protein  - Pro-stat 30 ml BID, each supplement provides 100 kcal and 15 grams of protein  NUTRITION DIAGNOSIS:   Increased nutrient needs related to acute illness(gallstone pancreatitis) as evidenced by estimated needs.  GOAL:   Patient will meet greater than or equal to 90% of their needs  MONITOR:   PO intake, Diet advancement, Labs, I & O's, Weight trends, Supplement acceptance  REASON FOR ASSESSMENT:   Consult Assessment of nutrition requirement/status  ASSESSMENT:   63 year old female who presented to the ED on 7/09 with epigastric pain. PMH of left-sided renal mass s/p left nephrectomy, renal stones, HTN, HLD, TIA, previous abdominal surgeries, gout. US showing sludge with gallbladder wall thickening. Pt admitted with gallstone pancreatitis.  7/10 - transferred to ICU due to septic shock due to bacteremia, MRCP showing pancreatitis and cholelithiasis 7/11 - transferred back to the floor 7/12 - diet advanced to clear liquids  Per Surgery note today, "ok to advance diet per medical service as patient is able to tolerate." No plans for surgical intervention during this admission as pt is quite weak. Plan is for outpatient surgical intervention once pt improves.  Spoke with pt at beside. Pt in good spirits, stating she is hungry and requesting some "regular food."  Pt reports that she has not had any N/V since admission.  Pt denies any weight changes PTA. Pt reports that her UBW is 191-201 lbs. Weight on admission was 208 lbs, current weight is 210 lbs. Pt does have some edema to BLE on exam.  Medications reviewed and include: Lasix, IV  abx, IV magnesium sulfate 2 grams once  Labs reviewed: magnesium 1.5, hemoglobin 8.9  UOP: 2300 ml x 24 hours I/O's: +2.0 L since admit  NUTRITION - FOCUSED PHYSICAL EXAM:  Deferred at this time.  Diet Order:   Diet Order            Diet clear liquid Room service appropriate? Yes; Fluid consistency: Thin  Diet effective now              EDUCATION NEEDS:   No education needs have been identified at this time  Skin:  Skin Assessment: Reviewed RN Assessment  Last BM:  05/15/19  Height:   Ht Readings from Last 1 Encounters:  05/12/19 5\' 1"  (1.549 m)    Weight:   Wt Readings from Last 1 Encounters:  05/17/19 95.4 kg    Ideal Body Weight:  47.7 kg  BMI:  Body mass index is 39.74 kg/m.  Estimated Nutritional Needs:   Kcal:  1600-1800  Protein:  80-95 grams  Fluid:  >/= 1.6 L    Gaynell Face, MS, RD, LDN Inpatient Clinical Dietitian Pager: [phone] Weekend/After Hours: [phone]

## 2019-05-18 ENCOUNTER — Inpatient Hospital Stay (HOSPITAL_COMMUNITY): Payer: Medicare Other

## 2019-05-18 DIAGNOSIS — R7881 Bacteremia: Secondary | ICD-10-CM

## 2019-05-18 DIAGNOSIS — D649 Anemia, unspecified: Secondary | ICD-10-CM

## 2019-05-18 DIAGNOSIS — Z9981 Dependence on supplemental oxygen: Secondary | ICD-10-CM

## 2019-05-18 DIAGNOSIS — E876 Hypokalemia: Secondary | ICD-10-CM

## 2019-05-18 LAB — BASIC METABOLIC PANEL
Anion gap: 13 (ref 5–15)
Anion gap: 14 (ref 5–15)
BUN: 25 mg/dL — ABNORMAL HIGH (ref 8–23)
BUN: 27 mg/dL — ABNORMAL HIGH (ref 8–23)
CO2: 27 mmol/L (ref 22–32)
CO2: 26 mmol/L (ref 22–32)
Calcium: 8.4 mg/dL — ABNORMAL LOW (ref 8.9–10.3)
Calcium: 8.3 mg/dL — ABNORMAL LOW (ref 8.9–10.3)
Chloride: 90 mmol/L — ABNORMAL LOW (ref 98–111)
Chloride: 91 mmol/L — ABNORMAL LOW (ref 98–111)
Creatinine, Ser: 2.08 mg/dL — ABNORMAL HIGH (ref 0.44–1.00)
Creatinine, Ser: 2.21 mg/dL — ABNORMAL HIGH (ref 0.44–1.00)
GFR calc Af Amer: 28 mL/min — ABNORMAL LOW (ref >60)
GFR calc Af Amer: 26 mL/min — ABNORMAL LOW (ref >60)
GFR calc non Af Amer: 24 mL/min — ABNORMAL LOW (ref >60)
GFR calc non Af Amer: 23 mL/min — ABNORMAL LOW (ref >60)
Glucose, Bld: 112 mg/dL — ABNORMAL HIGH (ref 70–99)
Glucose, Bld: 163 mg/dL — ABNORMAL HIGH (ref 70–99)
Potassium: 2.8 mmol/L — ABNORMAL LOW (ref 3.5–5.1)
Potassium: 2.8 mmol/L — ABNORMAL LOW (ref 3.5–5.1)
Sodium: 130 mmol/L — ABNORMAL LOW (ref 135–145)
Sodium: 131 mmol/L — ABNORMAL LOW (ref 135–145)

## 2019-05-18 LAB — CBC WITH DIFFERENTIAL/PLATELET
Abs Immature Granulocytes: 0.65 10*3/uL — ABNORMAL HIGH (ref 0.00–0.07)
Basophils Absolute: 0 10*3/uL (ref 0.0–0.1)
Basophils Relative: 0 %
Eosinophils Absolute: 0.1 10*3/uL (ref 0.0–0.5)
Eosinophils Relative: 0 %
HCT: 24.6 % — ABNORMAL LOW (ref 36.0–46.0)
Hemoglobin: 8.4 g/dL — ABNORMAL LOW (ref 12.0–15.0)
Immature Granulocytes: 5 %
Lymphocytes Relative: 12 %
Lymphs Abs: 1.5 10*3/uL (ref 0.7–4.0)
MCH: 31.5 pg (ref 26.0–34.0)
MCHC: 34.1 g/dL (ref 30.0–36.0)
MCV: 92.1 fL (ref 80.0–100.0)
Monocytes Absolute: 0.9 10*3/uL (ref 0.1–1.0)
Monocytes Relative: 7 %
Neutro Abs: 9.9 10*3/uL — ABNORMAL HIGH (ref 1.7–7.7)
Neutrophils Relative %: 76 %
Platelets: 73 10*3/uL — ABNORMAL LOW (ref 150–400)
RBC: 2.67 MIL/uL — ABNORMAL LOW (ref 3.87–5.11)
RDW: 14.7 % (ref 11.5–15.5)
WBC: 13.1 10*3/uL — ABNORMAL HIGH (ref 4.0–10.5)
nRBC: 0.3 % — ABNORMAL HIGH (ref 0.0–0.2)

## 2019-05-18 LAB — MAGNESIUM
Magnesium: 1.6 mg/dL — ABNORMAL LOW (ref 1.7–2.4)
Magnesium: 2.1 mg/dL (ref 1.7–2.4)

## 2019-05-18 LAB — HEPATIC FUNCTION PANEL
ALT: 82 U/L — ABNORMAL HIGH (ref 0–44)
AST: 28 U/L (ref 15–41)
Albumin: 2 g/dL — ABNORMAL LOW (ref 3.5–5.0)
Alkaline Phosphatase: 178 U/L — ABNORMAL HIGH (ref 38–126)
Bilirubin, Direct: 0.6 mg/dL — ABNORMAL HIGH (ref 0.0–0.2)
Indirect Bilirubin: 0.1 mg/dL — ABNORMAL LOW (ref 0.3–0.9)
Total Bilirubin: 0.7 mg/dL (ref 0.3–1.2)
Total Protein: 5.8 g/dL — ABNORMAL LOW (ref 6.5–8.1)

## 2019-05-18 LAB — ECHOCARDIOGRAM LIMITED
Height: 61 in
Weight: 3354.52 oz

## 2019-05-18 MED ORDER — OXYCODONE HCL 5 MG PO TABS
2.5000 mg | ORAL_TABLET | Freq: Four times a day (QID) | ORAL | Status: DC | PRN
  Administered 2019-05-21 – 2019-05-22 (×2): 2.5 mg via ORAL
  Filled 2019-05-18 (×2): qty 1

## 2019-05-18 MED ORDER — MAGNESIUM SULFATE 2 GM/50ML IV SOLN
2.0000 g | Freq: Once | INTRAVENOUS | Status: AC
  Administered 2019-05-18: 2 g via INTRAVENOUS
  Filled 2019-05-18: qty 50

## 2019-05-18 MED ORDER — POTASSIUM CHLORIDE CRYS ER 20 MEQ PO TBCR
40.0000 meq | EXTENDED_RELEASE_TABLET | Freq: Two times a day (BID) | ORAL | Status: DC
  Administered 2019-05-18 – 2019-05-19 (×3): 40 meq via ORAL
  Filled 2019-05-18 (×3): qty 2

## 2019-05-18 MED ORDER — ORAL CARE MOUTH RINSE
15.0000 mL | Freq: Two times a day (BID) | OROMUCOSAL | Status: DC
  Administered 2019-05-18 – 2019-05-22 (×8): 15 mL via OROMUCOSAL

## 2019-05-18 NOTE — Progress Notes (Signed)
Subjective:  Ms. Redmann was examined at the bedside this morning while she was resting. Patient was quite somnolent today but was able to respond to questions accurately. Overnight, she endorses some upper abdominal pain and mid-upper back pain. 5 mg oxycodone was given to help mitigate the pain. She admits to having some  postprandial pain since yesterday. Otherwise, she feels comfortable advancing her diet. No nausea or vomiting.   Additionally, admits to being short of breath today while resting. This morning she was on 7 L of oxygen via nasal cannula and is currently on 2 L of oxygen via nasal cannula. She has never been diagnosed with OSA, needed oxygen at home, or been diagnosed with other pulmonary diseases.  Objective:  Vital signs in last 24 hours: Vitals:   05/18/19 0800 05/18/19 0900 05/18/19 1100 05/18/19 1153  BP:    (!) 149/81  Pulse: 94 95 95   Resp: 16 20 (!) 21   Temp: 99.5 F (37.5 C)   99.5 F (37.5 C)  TempSrc:    Oral  SpO2: 97% 95% 95%   Weight:      Height:       Weight change: -0.3 kg  Intake/Output Summary (Last 24 hours) at 05/18/2019 1235 Last data filed at 05/18/2019 1100 Gross per 24 hour  Intake 740 ml  Output 1450 ml  Net -710 ml   Physical Examination: General: Somnolent, obese, resting comfortably HEENT: Dry mucus membranes. Trachea midline.  CV: RRR. Normal S1, S2. No R/M/G Pulm: Bilateral rales bilaterally in lower lobes but heard best in LLL. Bilateral wheezes in upper lobes. Normal work of breathing while laying down.  Abd: NABS. Soft abdomen. No tenderness to upper abdominal palpation Extremities: Persistent peripheral edema in lower extremities (normal for pt.) 2+ dorsalis pedis, radial pulse bilaterally Neuro: Oriented x 3. Awake and alert.   Assessment/Plan:  Principal Problem:   Gallstone pancreatitis Active Problems:   E coli bacteremia   Shock liver   Septic shock (HCC)   Sepsis without acute organ dysfunction ([place])  Oxygen dependent  Ms. Orzechowski is a 63 y.o female with a PMHx of HTN, HLD, axonal neuropathy, and left renal nephrectomy who is currently receiving management for septic shock due to E. Coli and Strep. gallolyticus in the setting of cholecystitis and pancreatitis. Patient is currently stable on 7L oxygen with abnormal potassium (2.8), magnesium (1.6) values as well as increasing WBC (13.1).  Septic shock/bacteremia/hypoxic respiratory failure:Blood cultures from (07/09) detected E.Coli resistant to ampicillin/ampicillin-sulbactam/TMP-SMX. Cultures also detected Strep. gallolyticus with no resistance.  - continue 2g ceftriaxone (day 5 of antibiotics plan for 3 more days) - additional dose ofone time20 mg IV lasix, monitor creatinine due to CKD (Lasix holiday 07/16) - hold IV fluids - Work up strep. gallolyticus with TTE (pending read)  - Chest X-ray ordered due to increasing WBC and moderate O2 sat w/7L oxygen overnight  - Continue to encourage incentive spirometry/ O2 sat goal > 92%  Acute Cholecystitis/Pancreatitis:Patient underwent a MRCP 07/10 that demonstrated pancreatitis and cholelithiasis, no complications of pancreatitis. Surgery (consult on 07/14) will hold surgical intervention until patient is in outpatient setting to avoid potential complications.  - advance diet to full liquid from clear liquid - monitor for tolerance and upper abdominal pain radiating to back  - PRN oxycodone dosage decreased from 5 mg to 2.5 mg   Elevated Transaminases: Labs continuing to trend downward on 07/14. Her AST has decreased to 36 from (87), ALT decreased to 150 (from  259) and bilirubin decreased to 1.9 (from 2.5) 07/14.  - labs pending, continue to trend   Hypokalemia: On 7/15, potassium decreased to 2.8 from 3.5. Most likely associated to patients current Lasix use (3 days use).  - start 40 mEq oral potassium chloride BID - monitor on telemetry   Anemia: Upon arrival 07/09 patient had  hemoglobin of 12. Since her arrival, her hemoglobin has steadily dropped most likely due to fluid overload. However, patient's hemoglobin dropped to 8.4 today (07/15) with a normal MCV of 92.1  - continue to monitor with CBC - consider iron studies, reticulocyte count to better qualify patient's hemoglobin  Hypomagnesemia: On 07/13, Magnesium improved to 1.6 (from 1.5 on 07/13). - continue 2 g magnesium sulfate  - monitor on telemetry  DVT Prophylaxis: Platelets remain low at 73 on 07/15.  - continue with SCDs   LOS: 6 days   Adiel Erney, Marijean Bravo, Medical Student 05/18/2019, 12:35 PM

## 2019-05-18 NOTE — TOC Initial Note (Addendum)
Transition of Care [facility]) - Initial/Assessment Note    Patient Details  Name: [name] MRN: [id number] Date of Birth: 03/17/1956  Transition of Care [facility]) CM/SW Contact:    Candie Chroman, LCSW Phone Number: 05/18/2019, 10:03 AM  Clinical Narrative: CSW met with patient, introduced role, and explained that PT recommendations would be discussed. Patient agreeable to home health PT and RN. She does not feel she needs and aide. She already has home oxygen but no other DME. She is agreeable to PT recommendations for a rolling walker and 3-in-1. [facility] and [facility]), second is [facility], and third is [facility]. 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 No further concerns. CSW encouraged patient to contact CSW as needed. CSW will continue to follow patient for support and facilitate return home at discharge.      12:15 pm: [place] has accepted referral.             Expected Discharge Plan: [place] Barriers to Discharge: Continued Medical Work up   Patient Goals and CMS Choice   CMS Medicare.gov Compare Post Acute Care list provided to:: Patient    Expected Discharge Plan and Services Expected Discharge Plan: [place] Choice: [place] arrangements for the past 2 months: Single Family Home                           HH Arranged: PT, RN [facility] Agency: [place] ([place]) Date [place]: 05/18/19   Representative spoke with at [place]: Butch Penny  Prior Living Arrangements/Services Living arrangements for the past 2 months: [place] Lives with:: Adult Children, Pets Patient language and need for interpreter reviewed:: Yes(No needs.) Do you feel safe going back to the place where you live?: Yes      Need for Family Participation in Patient Care: Yes (Comment) Care giver support system in place?: Yes  (comment)   Criminal Activity/Legal Involvement Pertinent to Current Situation/Hospitalization: No - Comment as needed  Activities of Daily Living Home Assistive Devices/Equipment: Eyeglasses ADL Screening (condition at time of admission) Patient's cognitive ability adequate to safely complete daily activities?: Yes Is the patient deaf or have difficulty hearing?: No Does the patient have difficulty seeing, even when wearing glasses/contacts?: No Does the patient have difficulty concentrating, remembering, or making decisions?: No Patient able to express need for assistance with ADLs?: Yes Does the patient have difficulty dressing or bathing?: No Independently performs ADLs?: Yes (appropriate for developmental age) Does the patient have difficulty walking or climbing stairs?: No Weakness of Legs: None Weakness of Arms/Hands: None  Permission Sought/Granted Permission sought to share information with : Investment banker, corporate granted to share info w AGENCY: [facility], [facility], and [facility]        Emotional Assessment Appearance:: Appears stated age Attitude/Demeanor/Rapport: Engaged, Gracious Affect (typically observed): Accepting, Appropriate, Calm, Pleasant Orientation: : Oriented to Self, Oriented to  Time, Oriented to Place, Oriented to Situation Alcohol / Substance Use: Never Used Psych Involvement: No (comment)  Admission diagnosis:  Gall stone pancreatitis [K85.10] Abdominal pain [R10.9] Sepsis without acute organ dysfunction, due to unspecified organism [facility]) [A41.9] Gallstone pancreatitis [K85.10] Patient Active Problem List   Diagnosis Date Noted  . Sepsis without acute organ dysfunction ([place])   .  E coli bacteremia 05/15/2019  . Shock liver 05/15/2019  . Septic shock ([place]) 05/15/2019  . Gallstone pancreatitis 05/12/2019  . Renal mass 07/18/2015   PCP:  [name], MD Pharmacy:   [place], [place] - [address] [place] [place] [address] Phone: [phone] Fax: [phone]     Social Determinants of Health (SDOH) Interventions    Readmission Risk Interventions No flowsheet data found.

## 2019-05-18 NOTE — Progress Notes (Addendum)
Received pt in lethargic state, low grade fever, on 7L o2 OOB with encouragement and stimulation. Marked improvement in alertness in afternoon. Os sats >92% on 2L. Strict I + O maintained. Pt currently not on fluid restriction . Low output noted on shift.

## 2019-05-18 NOTE — Progress Notes (Signed)
  Echocardiogram 2D Echocardiogram has been performed.  [name] 05/18/2019, 11:23 AM

## 2019-05-19 LAB — CBC WITH DIFFERENTIAL/PLATELET
Abs Immature Granulocytes: 0.71 10*3/uL — ABNORMAL HIGH (ref 0.00–0.07)
Basophils Absolute: 0 10*3/uL (ref 0.0–0.1)
Basophils Relative: 0 %
Eosinophils Absolute: 0.1 10*3/uL (ref 0.0–0.5)
Eosinophils Relative: 1 %
HCT: 25.1 % — ABNORMAL LOW (ref 36.0–46.0)
Hemoglobin: 8.3 g/dL — ABNORMAL LOW (ref 12.0–15.0)
Immature Granulocytes: 4 %
Lymphocytes Relative: 9 %
Lymphs Abs: 1.5 10*3/uL (ref 0.7–4.0)
MCH: 31.3 pg (ref 26.0–34.0)
MCHC: 33.1 g/dL (ref 30.0–36.0)
MCV: 94.7 fL (ref 80.0–100.0)
Monocytes Absolute: 0.8 10*3/uL (ref 0.1–1.0)
Monocytes Relative: 5 %
Neutro Abs: 13.8 10*3/uL — ABNORMAL HIGH (ref 1.7–7.7)
Neutrophils Relative %: 81 %
Platelets: 106 10*3/uL — ABNORMAL LOW (ref 150–400)
RBC: 2.65 MIL/uL — ABNORMAL LOW (ref 3.87–5.11)
RDW: 15.1 % (ref 11.5–15.5)
WBC: 17 10*3/uL — ABNORMAL HIGH (ref 4.0–10.5)
nRBC: 0.1 % (ref 0.0–0.2)

## 2019-05-19 LAB — HEPATIC FUNCTION PANEL
ALT: 63 U/L — ABNORMAL HIGH (ref 0–44)
AST: 24 U/L (ref 15–41)
Albumin: 2 g/dL — ABNORMAL LOW (ref 3.5–5.0)
Alkaline Phosphatase: 182 U/L — ABNORMAL HIGH (ref 38–126)
Bilirubin, Direct: 0.5 mg/dL — ABNORMAL HIGH (ref 0.0–0.2)
Indirect Bilirubin: 0.6 mg/dL (ref 0.3–0.9)
Total Bilirubin: 1.1 mg/dL (ref 0.3–1.2)
Total Protein: 6 g/dL — ABNORMAL LOW (ref 6.5–8.1)

## 2019-05-19 LAB — BASIC METABOLIC PANEL
Anion gap: 15 (ref 5–15)
BUN: 29 mg/dL — ABNORMAL HIGH (ref 8–23)
CO2: 26 mmol/L (ref 22–32)
Calcium: 8.3 mg/dL — ABNORMAL LOW (ref 8.9–10.3)
Chloride: 91 mmol/L — ABNORMAL LOW (ref 98–111)
Creatinine, Ser: 2.23 mg/dL — ABNORMAL HIGH (ref 0.44–1.00)
GFR calc Af Amer: 26 mL/min — ABNORMAL LOW (ref >60)
GFR calc non Af Amer: 22 mL/min — ABNORMAL LOW (ref >60)
Glucose, Bld: 131 mg/dL — ABNORMAL HIGH (ref 70–99)
Potassium: 3.2 mmol/L — ABNORMAL LOW (ref 3.5–5.1)
Sodium: 132 mmol/L — ABNORMAL LOW (ref 135–145)

## 2019-05-19 LAB — MAGNESIUM: Magnesium: 1.8 mg/dL (ref 1.7–2.4)

## 2019-05-19 MED ORDER — POTASSIUM CHLORIDE CRYS ER 20 MEQ PO TBCR
40.0000 meq | EXTENDED_RELEASE_TABLET | Freq: Once | ORAL | Status: AC
  Administered 2019-05-19: 40 meq via ORAL
  Filled 2019-05-19: qty 2

## 2019-05-19 MED ORDER — MAGNESIUM SULFATE IN D5W 1-5 GM/100ML-% IV SOLN
1.0000 g | Freq: Once | INTRAVENOUS | Status: AC
  Administered 2019-05-19: 1 g via INTRAVENOUS
  Filled 2019-05-19: qty 100

## 2019-05-19 NOTE — Progress Notes (Signed)
Subjective:  Patient was examined at the bedside this morning as she was resting and watching television. There were no acute events over night per chart review. She was able to tolerate her full liquid diet well and did not endorse back or abdominal pain like she did 07/15. Oxycodone 2.5 mg was not distributed overnight. In addition, she mentioned her work of breathing improved compared to yesterday. Her nurse mentioned that she was snoring last night which is "new" for her.   We discussed our current plans with her which included: managing her electrolytes, working with physical therapy and occupational therapy, and using her incentive spirometer more consistently. She was okay with this plan.   She spoke with her son [name]) this morning and asked [place] to call him for updates.   Objective:  Vital signs in last 24 hours: Vitals:   05/18/19 2330 05/19/19 0328 05/19/19 0803 05/19/19 0808  BP: 122/66 (!) 156/79  116/62  Pulse: 85 (!) 107 (!) 105   Resp: 18 (!) 26  (!) 26  Temp: 97.9 F (36.6 C) 98.4 F (36.9 C)  98.2 F (36.8 C)  TempSrc: Axillary Oral  Oral  SpO2: 96% 95% 96%   Weight:  93.3 kg    Height:       Weight change: -1.8 kg  Intake/Output Summary (Last 24 hours) at 05/19/2019 1120 Last data filed at 05/19/2019 0329 Gross per 24 hour  Intake 1300 ml  Output 550 ml  Net 750 ml   Physical Examination: General: Well appearing, obese female resting in bed.  HEENT: MMM. No JVD. Trachea midline. No conjunctival pallor.   CV: RRR. Normal S1, S2. No M/R/G Pulm: Bilateral rales in lower lung fields. End expiratory wheezing/coughing in upper lung fields Abd: NABS. No tenderness to palpation in abdominal region. Liver tip could not be palpated. Extremities: 2+ dorsalis pedis, radial pulse bilaterally. No peripheral edema noted in upper extremities. Bilateral lower extremity edema noted Neuro: Awake. Oriented x 3.   Assessment/Plan:  Principal Problem:   Gallstone  pancreatitis Active Problems:   E coli bacteremia   Shock liver   Septic shock (HCC)   Sepsis without acute organ dysfunction ([place])   Oxygen dependent  [name] is a 63 y.o female with a PMHx of HTN, HLD, axonal neuropathy, and left renal nephrectomy who is currently receiving management for septic shock due toE. Coli and Strep. gallolyticusin the setting of cholecystitis and pancreatitis. Patient is currently stable on 2L oxygen with improving work of breathing. Labs demonstrated slight hypokalemia (3.2). In addition, patients WBC increased from 13.1 to 17.1 this morning we will continue to monitor as she is afebrile, has improving interstitial infiltrates on X-ray and  no new blood culture microbial resistances.   Septic shock/bacteremia/hypoxic respiratory failure:Blood cultures from (07/09) detected E.Coli resistant to ampicillin/ampicillin-sulbactam/TMP-SMX.Cultures also detected Strep. gallolyticus with no resistance.07/16 her labs show increased WBC to 17.1 but she remains afebrile with improving RR and normal blood pressures. If her vitals decline consider repeat blood culture. Her TTE from 07/15 demonstrated normal cardiac morphology and normal valvular function. There is no clear explanation for her WBC increase but we will consider possible complications from cholecystitis, pancreatitis in light of her transition to a full liquid diet.  - continue 2g ceftriaxone (day6 of antibiotics plan for 28more days) -Lasix holiday - Continue to encourage incentive spirometry/ O2 sat goal > 92% - PT/OT consult  Acute Cholecystitis/Pancreatitis/Elevated Transaminases:Patient underwent a MRCP 07/10 that demonstrated pancreatitis and cholelithiasis, no complications  of pancreatitis. Surgery(consulton07/14) will hold surgical intervention until patient is in outpatient setting to avoid potential complications.07/16 AST is normal, ALT declined to 63 and alkaline phosphatase has remained  elevated at 182. Possible complications such as gangrenous cholecystitis, and pancreatic pseudocyst are less likely as patient's abdominal pain has resolved. An additional work-up with a RUQ ultrasound may be warranted if patient's pain returns in the setting of increased WBC.  - monitor for diet tolerance given new full liquid diet and upper abdominal pain radiating to back  - PRN oxycodone dosage decreased from 5 mg to 2.5 mg   Hypokalemia: On 7/15, potassium increased to 3.2 from 2.8. Most likely associated with patient's current Lasix use (4 days use).   - 40 mEq of oral potassium QD - lasix holiday - order telemetry if transferred to floor  Anemia: Upon arrival 07/09 patient had hemoglobin of 12. Since her arrival, her hemoglobin has steadily dropped most likely due to fluid overload and increased phlebotomy for labs in the ICU/Stepdown unit. Patient's hemoglobin was 8.4 (07/16). MCV normal at 95.2. Although patient is on 2 L of oxygen, her work of breathing has improved and there are no other signs of decreased oxygen delivery/anemia as she has no conjunctival pallor.  - draw CBC tomorrow AM reevaluate need for potential iron studies, reticulocyte count  Hypomagnesemia: Magnesium improved to 1.8 today. - Give 1 g magnesium sulfate given continued potassium repletion - order telemetry if transferred to floor   DVT Prophylaxis: Platelets remain improved to 106 from 75 today  - continue with SCDs - consider changes to DVT prophylaxis if platelets improve tomorrow    LOS: 7 days   [name], [name], Medical Student 05/19/2019, 11:20 AM

## 2019-05-19 NOTE — Progress Notes (Signed)
Physical Therapy Treatment Patient Details Name: [name] MRN: [id number] DOB: 11/15/1955 Today's Date: 05/19/2019    History of Present Illness Ms. Tri[place]i is a 63 y.o female with a PMHx of HTN, HLD, axonal neuropathy, and left renal nephrectomy who is currently receiving management for septic shock due to infection in the setting of cholecystitis and pancreatitis.     PT Comments    Patient ambulating increased distance on 2L O2 without significant SOB.  She is having incontinent episodes due to diuresis (both bowel and bladder this session.)  Feel she will benefit from continued skilled PT during acute stay and follow up HHPT at d/c.    Follow Up Recommendations  Home health PT;Supervision/Assistance - 24 hour     Equipment Recommendations  Rolling walker with 5" wheels;3in1 (PT)(youth RW)    Recommendations for Other Services       Precautions / Restrictions Precautions Precautions: Fall    Mobility  Bed Mobility Overal bed mobility: Needs Assistance Bed Mobility: Supine to Sit     Supine to sit: HOB elevated;Min assist     General bed mobility comments: increased time, assist to lift trunk  Transfers Overall transfer level: Needs assistance Equipment used: Rolling walker (2 wheeled) Transfers: Sit to/from Stand Sit to Stand: Min assist         General transfer comment: incontinent of urine upon standing  Ambulation/Gait Ambulation/Gait assistance: Min guard Gait Distance (Feet): 90 Feet Assistive device: Rolling walker (2 wheeled) Gait Pattern/deviations: Step-through pattern;Decreased stride length;Shuffle     General Gait Details: on 2 L portable O2 throughout with dyspnea 2-3/4 and cues for PLB, patient denied severe SOB   Stairs             Wheelchair Mobility    Modified Rankin (Stroke Patients Only)       Balance Overall balance assessment: Needs assistance   Sitting balance-Leahy Scale: Good     Standing balance  support: Single extremity supported;No upper extremity supported Standing balance-Leahy Scale: Fair Standing balance comment: toileted with A for cleaning legs and feet due to incontinence, pt able to perform peri hygiene                            Cognition Arousal/Alertness: Awake/alert Behavior During Therapy: WFL for tasks assessed/performed Overall Cognitive Status: Within Functional Limits for tasks assessed                                        Exercises      General Comments        Pertinent Vitals/Pain Pain Assessment: No/denies pain    Home Living                      Prior Function            PT Goals (current goals can now be found in the care plan section) Progress towards PT goals: Progressing toward goals    Frequency    Min 3X/week      PT Plan Current plan remains appropriate    Co-evaluation              AM-PAC PT "6 Clicks" Mobility   Outcome Measure  Help needed turning from your back to your side while in a flat bed without using bedrails?: None Help needed moving from lying  on your back to sitting on the side of a flat bed without using bedrails?: A Little Help needed moving to and from a bed to a chair (including a wheelchair)?: A Little Help needed standing up from a chair using your arms (e.g., wheelchair or bedside chair)?: A Little Help needed to walk in hospital room?: A Little Help needed climbing 3-5 steps with a railing? : A Little 6 Click Score: 19    End of Session Equipment Utilized During Treatment: Gait belt;Oxygen Activity Tolerance: Patient tolerated treatment well Patient left: in chair;with call bell/phone within reach   PT Visit Diagnosis: Unsteadiness on feet (R26.81);Other abnormalities of gait and mobility (R26.89)     Time: 1425-1505 PT Time Calculation (min) (ACUTE ONLY): 40 min  Charges:  $Gait Training: 8-22 mins $Therapeutic Activity: 8-22 mins                      [place] [phone] 05/19/2019    Reginia Naas 05/19/2019, 4:57 PM

## 2019-05-19 NOTE — Care Management Important Message (Signed)
Important Message  Patient Details  Name: [name] MRN: [id number] Date of Birth: 1956/07/23   Medicare Important Message Given:  Yes     Memory Argue 05/19/2019, 3:23 PM

## 2019-05-20 DIAGNOSIS — R5381 Other malaise: Secondary | ICD-10-CM

## 2019-05-20 LAB — CBC
HCT: 23.8 % — ABNORMAL LOW (ref 36.0–46.0)
Hemoglobin: 7.8 g/dL — ABNORMAL LOW (ref 12.0–15.0)
MCH: 31.3 pg (ref 26.0–34.0)
MCHC: 32.8 g/dL (ref 30.0–36.0)
MCV: 95.6 fL (ref 80.0–100.0)
Platelets: 135 10*3/uL — ABNORMAL LOW (ref 150–400)
RBC: 2.49 MIL/uL — ABNORMAL LOW (ref 3.87–5.11)
RDW: 15.1 % (ref 11.5–15.5)
WBC: 14 10*3/uL — ABNORMAL HIGH (ref 4.0–10.5)
nRBC: 0 % (ref 0.0–0.2)

## 2019-05-20 LAB — BASIC METABOLIC PANEL WITH GFR
Anion gap: 10 (ref 5–15)
BUN: 29 mg/dL — ABNORMAL HIGH (ref 8–23)
CO2: 27 mmol/L (ref 22–32)
Calcium: 8.5 mg/dL — ABNORMAL LOW (ref 8.9–10.3)
Chloride: 95 mmol/L — ABNORMAL LOW (ref 98–111)
Creatinine, Ser: 2.04 mg/dL — ABNORMAL HIGH (ref 0.44–1.00)
GFR calc Af Amer: 29 mL/min — ABNORMAL LOW
GFR calc non Af Amer: 25 mL/min — ABNORMAL LOW
Glucose, Bld: 134 mg/dL — ABNORMAL HIGH (ref 70–99)
Potassium: 3.6 mmol/L (ref 3.5–5.1)
Sodium: 132 mmol/L — ABNORMAL LOW (ref 135–145)

## 2019-05-20 LAB — HEPATIC FUNCTION PANEL
ALT: 51 U/L — ABNORMAL HIGH (ref 0–44)
AST: 31 U/L (ref 15–41)
Albumin: 1.9 g/dL — ABNORMAL LOW (ref 3.5–5.0)
Alkaline Phosphatase: 184 U/L — ABNORMAL HIGH (ref 38–126)
Bilirubin, Direct: 0.4 mg/dL — ABNORMAL HIGH (ref 0.0–0.2)
Indirect Bilirubin: 0.3 mg/dL (ref 0.3–0.9)
Total Bilirubin: 0.7 mg/dL (ref 0.3–1.2)
Total Protein: 5.7 g/dL — ABNORMAL LOW (ref 6.5–8.1)

## 2019-05-20 LAB — MAGNESIUM: Magnesium: 1.9 mg/dL (ref 1.7–2.4)

## 2019-05-20 LAB — IRON AND TIBC
Iron: 11 ug/dL — ABNORMAL LOW (ref 28–170)
Saturation Ratios: 6 % — ABNORMAL LOW (ref 10.4–31.8)
TIBC: 199 ug/dL — ABNORMAL LOW (ref 250–450)
UIBC: 188 ug/dL

## 2019-05-20 LAB — FERRITIN: Ferritin: 727 ng/mL — ABNORMAL HIGH (ref 11–307)

## 2019-05-20 MED ORDER — ENOXAPARIN SODIUM 30 MG/0.3ML ~~LOC~~ SOLN
30.0000 mg | Freq: Every day | SUBCUTANEOUS | Status: DC
  Administered 2019-05-20 – 2019-05-22 (×3): 30 mg via SUBCUTANEOUS
  Filled 2019-05-20 (×3): qty 0.3

## 2019-05-20 MED ORDER — SODIUM CHLORIDE 0.9 % IV SOLN
510.0000 mg | Freq: Once | INTRAVENOUS | Status: AC
  Administered 2019-05-20: 11:00:00 510 mg via INTRAVENOUS
  Filled 2019-05-20: qty 17

## 2019-05-20 NOTE — TOC Progression Note (Addendum)
Transition of Care [facility]) - Progression Note    Patient Details  Name: [name] MRN: [id number] Date of Birth: 08/26/1956  Transition of Care [facility]) CM/SW Contact  Candie Chroman, LCSW Phone Number: 05/20/2019, 2:45 PM  Clinical Narrative: CSW notified [facility] representative that patient may discharge this weekend. Sent message to medical student requesting that they go ahead and put in home health orders for PT and RN and DME orders for rolling walker and 3-in-1 so everything can be ordered and ready to go once she is ready this weekend.    4:28 pm: Called Zack with [employer] and ordered 3-in-1 and two-wheel rolling walker. Asked MD to change rollator order to just youth size two-wheel walker per PT note.  Expected Discharge Plan: [place] Barriers to Discharge: Continued Medical Work up  Expected Discharge Plan and Services Expected Discharge Plan: [place] Choice: [place] arrangements for the past 2 months: Single Family Home                           HH Arranged: PT, RN [facility] Agency: [place] ([facility]) Date [place]: 05/18/19   Representative spoke with at [place]: Lupus ([place]) Interventions    Readmission Risk Interventions No flowsheet data found.

## 2019-05-20 NOTE — Progress Notes (Signed)
Subjective:  Patient was examined at the bedside this morning while she was resting in bed watching television. Yesterday, she worked with PT and they recommended PT home health with supervision and assistance. Patient's daughter in law is a CNA and is willing to help with overall home health.    In terms of her current condition, she was breathing comfortably with 2 L oxygen. In addition, she denies any postprandial back and abdomen pain. Overall, she notes feeling much better. Her only question today was regarding her current kidney function. We explained that her creatinine levels have improved since we discontinued her lasix and that we will continue to monitor them prior to discharge.   We discussed future plans with her including: continuing her antibiotics and restarting some home medications. She would be comfortable being discharge this weekend. Patient would like for [place] to call her youngest son Larkin Ina [phone])  Objective:  Vital signs in last 24 hours: Vitals:   05/19/19 2331 05/20/19 0338 05/20/19 0500 05/20/19 0800  BP: 107/60 138/75  132/79  Pulse: 80 89  84  Resp: 16 15    Temp: 98.5 F (36.9 C) 97.7 F (36.5 C)  98.1 F (36.7 C)  TempSrc: Oral Oral  Oral  SpO2: 92% 93%  90%  Weight:   93.3 kg   Height:       Weight change: 0 kg  Intake/Output Summary (Last 24 hours) at 05/20/2019 1020 Last data filed at 05/20/2019 0800 Gross per 24 hour  Intake 1027 ml  Output 150 ml  Net 877 ml   Physical Examination: General: Well appearing, obese female resting in bed.  HEENT: MMM. No JVD. Trachea midline. No conjunctival pallor.   CV: RRR. Normal S1, S2. No M/R/G Pulm: Faint bilateral rales in lower lung fields. No end expiratory wheezes. Abd: NABS. No tenderness to palpation in abdominal region. Liver tip could not be palpated. Extremities: 2+ dorsalis pedis, radial pulse bilaterally. No peripheral edema noted in upper extremities. Bilateral lower extremity edema  noted Neuro: Awake. Oriented x 3.   Assessment/Plan:  Principal Problem:   Gallstone pancreatitis Active Problems:   E coli bacteremia   Shock liver   Septic shock (HCC)   Sepsis without acute organ dysfunction ([place])   Oxygen dependent   Physical deconditioning  [name] is a 63 y.o female with a PMHx of HTN, HLD, axonal neuropathy, and left renal nephrectomy who is currently receiving management for septic shock due toE. Coli and Strep. gallolyticusin the setting of cholecystitis and pancreatitis. Today, patient's WBC downtrended from 17 to 14. Her Hgb continues to decrease and is currently at 7.8 in the setting of high ferritin (727), low iron (11), low TIBC (199). Patient is currently stable on2L oxygen and is probable for discharge this weekend.   Septic shock/bacteremia/hypoxic respiratory failure:Blood cultures from (07/09) detected E.Coli resistant to ampicillin/ampicillin-sulbactam/TMP-SMX.Cultures also detected Strep. gallolyticus with no resistance.07/16 her labs show increased WBC to 17.1 but she remains afebrile with improving RR and normal blood pressures. If her vitals decline consider repeat blood culture. Her TTE from 07/15 demonstrated normal cardiac morphology and normal valvular function.  - continue 2g ceftriaxone (day7of antibiotics plan for1 more day) -continue to encourage incentive spirometry/ O2 sat goal > 92% - OT consult  Acute Cholecystitis/Pancreatitis/Elevated Transaminases:Patient underwent a MRCP 07/10 that demonstrated pancreatitis and cholelithiasis, no complications of pancreatitis. Surgery(consulton07/14) will hold surgical intervention until patient is in outpatient setting to avoid potential complications.07/17 ALT (51) and alk phosphatase (184)  are slightly elevated. An additional work-up with a RUQ ultrasound may be warranted if patient's pain returns in the setting of increased WBC.  - monitorfor diet tolerance given new full liquid  diet andupper abdominal pain radiating to back  - PRN oxycodone dosage decreased from 5 mg to 2.5 mg  Anemia: Upon arrival 07/09 patient had hemoglobin of 12. Since her arrival, her hemoglobin has steadily dropped most likely due to fluid overload and increased phlebotomy for labs in the ICU/Stepdown unit. Patient's hemoglobin was 8.4 (07/16). Today, (07/17) hemoglobin is 7.8 with an MCV of MCV 95.6. Iron studies 07/17 demonstrate high ferritin (727), low iron (11), low TIBC (199). Patient is on 2 L of oxygen, with normal work of breathing and no other signs of decreased oxygen delivery. - start one time dose 510 mg IV feraheme   DVT Prophylaxis: Platelets increased to 135  - start 30 mg sub q injection of lovenox QD - discontinue with SCDs  Hypokalemia:On 7/15, potassium increased to 3.2 from 2.8. Most likely associated with patient's current Lasix use (4 days use). 07/16 patient's potassium 3.6.  - resolved  Hypomagnesemia: Magnesium improved to 1.9 today. - resolved    LOS: 8 days   BrinsonMarijean Bravo, Medical Student 05/20/2019, 10:20 AM

## 2019-05-20 NOTE — Progress Notes (Signed)
Tolerated room air at rest. o2 sat 93% . Continue to monitor.

## 2019-05-20 NOTE — Evaluation (Signed)
Occupational Therapy Evaluation Patient Details Name: [name] MRN: [id number] DOB: [birth year] Today's Date: 05/20/2019    History of Present Illness [name] is a 63 y.o female with a PMHx of HTN, HLD, axonal neuropathy, and left renal nephrectomy who is currently receiving management for septic shock due to infection in the setting of cholecystitis and pancreatitis.    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 She will benefit from acute OT without need for follow up--her dtr-in-law is a CNA and will be A'ing her at home.     Follow Up Recommendations  No OT follow up;Supervision/Assistance - 24 hour    Equipment Recommendations  None recommended by OT       Precautions / Restrictions Precautions Precautions: Fall Precaution Comments: watch O2 sats (stayed 91 or above today with ADLs on RA) Restrictions Weight Bearing Restrictions: No      Mobility Bed Mobility Overal bed mobility: Modified Independent       Supine to sit: Modified independent (Device/Increase time);HOB elevated     General bed mobility comments: increased time  Transfers Overall transfer level: Needs assistance Equipment used: Rolling walker (2 wheeled) Transfers: Sit to/from Stand Sit to Stand: Min guard              Balance Overall balance assessment: Needs assistance Sitting-balance support: No upper extremity supported;Feet supported Sitting balance-Leahy Scale: Fair     Standing balance support: Single extremity supported;No upper extremity supported;During functional activity Standing balance-Leahy Scale: Poor                             ADL either performed or assessed with clinical judgement   ADL Overall ADL's : Needs  assistance/impaired Eating/Feeding: Independent;Sitting   Grooming: Set up;Sitting   Upper Body Bathing: Set up;Sitting   Lower Body Bathing: Minimal assistance Lower Body Bathing Details (indicate cue type and reason): min guard A sit<>stand Upper Body Dressing : Set up;Sitting   Lower Body Dressing: Minimal assistance Lower Body Dressing Details (indicate cue type and reason): min guard A sit<>stand Toilet Transfer: Min guard;Stand-pivot;BSC   Toileting- Clothing Manipulation and Hygiene: Moderate assistance Toileting - Clothing Manipulation Details (indicate cue type and reason): min guard A sit<>stand, could not get to back peri care             Vision Patient Visual Report: No change from baseline              Pertinent Vitals/Pain Pain Assessment: No/denies pain     Hand Dominance  right   Extremity/Trunk Assessment Upper Extremity Assessment Upper Extremity Assessment: Overall WFL for tasks assessed           Communication Communication Communication: No difficulties   Cognition Arousal/Alertness: Awake/alert Behavior During Therapy: WFL for tasks assessed/performed Overall Cognitive Status: Within Functional Limits for tasks assessed                                       Home Living Family/patient expects to be discharged to:: Private residence Living Arrangements: Children Available Help at Discharge: Family Type of Home: House Home Access: Stairs to enter [employer] of Steps: 3 Entrance Stairs-Rails:  Right;Left Home Layout: One level                          Prior Functioning/Environment Level of Independence: Independent        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 Problem List: Decreased strength;Impaired balance (sitting and/or standing);Obesity         OT Goals(Current goals can be found in the care plan section) Acute Rehab OT Goals Patient Stated Goal: to go home OT  Goal Formulation: With patient Time For Goal Achievement: 06/03/19 Potential to Achieve Goals: Good  OT Frequency: Min 2X/week              AM-PAC OT "6 Clicks" Daily Activity     Outcome Measure Help from another person eating meals?: None Help from another person taking care of personal grooming?: A Little Help from another person toileting, which includes using toliet, bedpan, or urinal?: A Little Help from another person bathing (including washing, rinsing, drying)?: A Lot Help from another person to put on and taking off regular upper body clothing?: A Little Help from another person to put on and taking off regular lower body clothing?: A Lot 6 Click Score: 17   End of Session Equipment Utilized During Treatment: Gait belt;Rolling walker Nurse Communication: (we both agreed pt did not need a chair alarm)  Activity Tolerance: Patient tolerated treatment well Patient left: in chair;with call bell/phone within reach  OT Visit Diagnosis: Unsteadiness on feet (R26.81);Muscle weakness (generalized) (M62.81)                Time: 3007-6226 OT Time Calculation (min): 31 min Charges:  OT General Charges $OT Visit: 1 Visit OT Evaluation $OT Eval Moderate Complexity: 1 Mod OT Treatments $Self Care/Home Management : 8-22 mins [name], OTR/L Acute [employer] Pager [phone] Office [phone]     [name] 05/20/2019, 12:15 PM

## 2019-05-21 LAB — COMPREHENSIVE METABOLIC PANEL
ALT: 45 U/L — ABNORMAL HIGH (ref 0–44)
AST: 35 U/L (ref 15–41)
Albumin: 1.9 g/dL — ABNORMAL LOW (ref 3.5–5.0)
Alkaline Phosphatase: 215 U/L — ABNORMAL HIGH (ref 38–126)
Anion gap: 13 (ref 5–15)
BUN: 25 mg/dL — ABNORMAL HIGH (ref 8–23)
CO2: 25 mmol/L (ref 22–32)
Calcium: 8.8 mg/dL — ABNORMAL LOW (ref 8.9–10.3)
Chloride: 93 mmol/L — ABNORMAL LOW (ref 98–111)
Creatinine, Ser: 1.75 mg/dL — ABNORMAL HIGH (ref 0.44–1.00)
GFR calc Af Amer: 35 mL/min — ABNORMAL LOW (ref >60)
GFR calc non Af Amer: 30 mL/min — ABNORMAL LOW (ref >60)
Glucose, Bld: 98 mg/dL (ref 70–99)
Potassium: 4 mmol/L (ref 3.5–5.1)
Sodium: 131 mmol/L — ABNORMAL LOW (ref 135–145)
Total Bilirubin: 1.1 mg/dL (ref 0.3–1.2)
Total Protein: 6.1 g/dL — ABNORMAL LOW (ref 6.5–8.1)

## 2019-05-21 LAB — CBC
HCT: 25.7 % — ABNORMAL LOW (ref 36.0–46.0)
Hemoglobin: 8.6 g/dL — ABNORMAL LOW (ref 12.0–15.0)
MCH: 31.5 pg (ref 26.0–34.0)
MCHC: 33.5 g/dL (ref 30.0–36.0)
MCV: 94.1 fL (ref 80.0–100.0)
Platelets: 174 10*3/uL (ref 150–400)
RBC: 2.73 MIL/uL — ABNORMAL LOW (ref 3.87–5.11)
RDW: 15 % (ref 11.5–15.5)
WBC: 12.3 10*3/uL — ABNORMAL HIGH (ref 4.0–10.5)
nRBC: 0 % (ref 0.0–0.2)

## 2019-05-21 LAB — MAGNESIUM: Magnesium: 1.7 mg/dL (ref 1.7–2.4)

## 2019-05-21 MED ORDER — LOSARTAN POTASSIUM 50 MG PO TABS
50.0000 mg | ORAL_TABLET | Freq: Every day | ORAL | Status: AC
  Administered 2019-05-21 – 2019-05-22 (×2): 50 mg via ORAL
  Filled 2019-05-21 (×2): qty 1

## 2019-05-21 NOTE — Plan of Care (Signed)

## 2019-05-21 NOTE — Discharge Instructions (Signed)
Pancreatitis Eating Plan Pancreatitis is when your pancreas becomes irritated and swollen (inflamed). The pancreas is a small organ located behind your stomach. It helps your body digest food and regulate your blood sugar. Pancreatitis can affect how your body digests food, especially foods with fat. You may also have other symptoms such as abdominal pain or nausea. When you have pancreatitis, following a low-fat eating plan may help you manage symptoms and recover more quickly. Work with your health care provider or a diet and nutrition specialist (dietitian) to create an eating plan that is right for you. What are tips for following this plan? Reading food labels Use the information on food labels to help keep track of how much fat you eat:  Check the serving size.  Look for the amount of total fat in grams (g) in one serving. ? Low-fat foods have 3 g of fat or less per serving. ? Fat-free foods have 0.5 g of fat or less per serving.  Keep track of how much fat you eat based on how many servings you eat. ? For example, if you eat two servings, the amount of fat you eat will be two times what is listed on the label. Shopping   Buy low-fat or nonfat foods, such as: ? Fresh, frozen, or canned fruits and vegetables. ? Grains, including pasta, bread, and rice. ? Lean meat, poultry, fish, and other protein foods. ? Low-fat or nonfat dairy.  Avoid buying bakery products and other sweets made with whole milk, butter, and eggs.  Avoid buying snack foods with added fat, such as anything with butter or cheese flavoring. Cooking  Remove skin from poultry, and remove extra fat from meat.  Limit the amount of fat and oil you use to 6 teaspoons or less per day.  Cook using low-fat methods, such as boiling, broiling, grilling, steaming, or baking.  Use spray oil to cook. Add fat-free chicken broth to add flavor and moisture.  Avoid adding cream to thicken soups or sauces. Use other thickeners  such as corn starch or tomato paste. Meal planning   Eat a low-fat diet as told by your dietitian. For most people, this means having no more than 55-65 grams of fat each day.  Eat small, frequent meals throughout the day. For example, you may have 5-6 small meals instead of 3 large meals.  Drink enough fluid to keep your urine pale yellow.  Do not drink alcohol. Talk to your health care provider if you need help stopping.  Limit how much caffeine you have, including black coffee, black and green tea, caffeinated soft drinks, and energy drinks. General information  Let your health care provider or dietitian know if you have unplanned weight loss on this eating plan.  You may be instructed to follow a clear liquid diet during a flare of symptoms. Talk with your health care provider about how to manage your diet during symptoms of a flare.  Take any vitamins or supplements as told by your health care provider.  Work with a dietitian, especially if you have other conditions such as obesity or diabetes mellitus. What foods should I avoid? Fruits Fried fruits. Fruits served with butter or cream. Vegetables Fried vegetables. Vegetables cooked with butter, cheese, or cream. Grains Biscuits, waffles, donuts, pastries, and croissants. Pies and cookies. Butter-flavored popcorn. Regular crackers. Meats and other protein foods Fatty cuts of meat. Poultry with skin. Organ meats. Bacon, sausage, and cold cuts. Whole eggs. Nuts and nut butters. Dairy Whole and   2% milk. Whole milk yogurt. Whole milk ice cream. Cream and half-and-half. Cream cheese. Sour cream. Cheese. Beverages Wine, beer, and liquor. The items listed above may not be a complete list of foods and beverages to avoid. Contact a dietitian for more information. Summary  Pancreatitis can affect how your body digests food, especially foods with fat.  When you have pancreatitis, it is recommended that you follow a low-fat eating  plan to help you recover more quickly and manage symptoms. For most people, this means limiting fat to no more than 55-65 grams per day.  Do not drink alcohol. Limit the amount of caffeine you have, and drink enough fluid to keep your urine pale yellow. This information is not intended to replace advice given to you by your health care provider. Make sure you discuss any questions you have with your health care provider. Document Released: 01/26/2018 Document Revised: 02/10/2019 Document Reviewed: 01/26/2018 Elsevier Patient Education  2020 Elsevier Inc.  

## 2019-05-21 NOTE — Progress Notes (Signed)
OT Cancellation Note  Patient Details Name: [name] MRN: [id number] DOB: [birth year]   Cancelled Treatment:    Reason Eval/Treat Not Completed: Patient declined, no reason specified. (Pt reporting significant pain. Will return as schedule allows.)  [place], OTR/L Acute Rehab Pager: [phone] Office: [phone] 05/21/2019, 4:27 PM

## 2019-05-21 NOTE — Discharge Summary (Signed)
Name: [name] MRN: [id number] DOB: 1956-01-19 63 y.o. PCP: [name], MD  Date of Admission: 05/12/2019  9:02 AM Date of Discharge: 05/22/2019 Attending Physician: [name], MD  Discharge Diagnosis: 1. Septic shock due to bacteremia  2. Gallstone Pancreatitis  3. Acute Cholecystitis/Cholelithiasis   Discharge Medications: Allergies as of 05/22/2019   No Known Allergies     Medication List    STOP taking these medications   metoprolol tartrate 25 MG tablet Commonly known as: LOPRESSOR     TAKE these medications   allopurinol 300 MG tablet Commonly known as: ZYLOPRIM Take 300 mg by mouth daily.   aspirin EC 81 MG tablet Take 81 mg by mouth daily.   Cranberry 500 MG Caps Take 500 mg by mouth daily.   gabapentin 300 MG capsule Commonly known as: NEURONTIN Take 300 mg by mouth 3 (three) times daily.   losartan 50 MG tablet Commonly known as: COZAAR Take 50 mg by mouth daily.   lovastatin 20 MG tablet Commonly known as: MEVACOR Take 20 mg by mouth every morning.   senna-docusate 8.6-50 MG tablet Commonly known as: Senokot S Take 1 tablet by mouth 2 (two) times daily. While taking pain meds to prevent constipation What changed:   when to take this  reasons to take this  additional instructions   VITAMIN D3 PO Take 5,000 mg by mouth daily.       Disposition and follow-up:   Ms.[name] was discharged from [facility] in [place] condition.  At the hospital follow up visit please address:   Pancreatitis / Cholelithiasis - Ensure resolution of pain - Ensure follow up with general surgery for future cholecystectomy - CMP  Strep Bactermia - Recommend Colonoscopy due to strep. Bovis bacteremia association with cancer  - Repeat CBC  Anemia - Suspect Fe sequestration deficiency due inflammation/sepsis - Repeat CBC  Other: - Ensure Home Health PT and other services arranged - Ensure resolution of chest wall pain -  Monitor platlet count, which drop while septic  3.  Labs / imaging needed at time of follow-up: CMP, Mg, CBC  4.  Pending labs/ test needing follow-up: N/A  Follow-up Appointments: Follow-up Information    [name], [name], MD Follow up on 06/01/2019.   Specialty: General Surgery Why: 2:10pm, arrive by 1:40am for paperwork and check in.  Please bring insurance card and photol ID with you to this appointment Contact information: [place] [address] [phone]        [name], MD. Schedule an appointment as soon as possible for a visit.   Specialty: Internal Medicine Contact information: [place] [address] [employer] by problem list: 1. Septic shock due to bacteremia (E.coli, Strep bovis): Following initial presentation indicative of acute cholecystitis/pancreatitis critical care was consulted due to persistent MAP ~ 65 despite fluid resuscitation with 6 L and signs of sepsis. Blood cultures demonstrated gram negative bacteremia. She was transferred to the ICU for 2 days where she received IV Antibiotics, Breif vasopressors, bowel rest, and IVF. Echocardiogram and chest xray were obtained to assess volume status and showed signs of volume overload. Blood cultures gre E.Coli and Strep Bovis sensitive to ceftriaxone (resulting in change of antibiotics), Antibiotic course completed while inpatient. Streptococcus bovis work-up included TTE to ensure no cardiac vegetations. Patient was diuresed due to volume overload caused by  large volume resuscitation; Lasix stopped 07/16 as patient neared normal volume status.   2. Acute cholecystitis/Cholelithiasis/Pancreatitis: Patient arrived to [facility] 07/09 complaining of RUQ abdominal pain that radiated to her mid back. Initial labs demonstrated elevated AST (753), ALT (312), lipase (8025), GGT (381) negative troponin, and a lactate of (4.2). Bedside RUQ ultrasound at time  of presentation demonstrated Gallbladder sludge with gallbladder wall thickening and pericholecystic fluid that suggested acute cholecystitis. Patient was treated with IV zosyn, oxycodone for pain control and was set to be NPO. Surgery consulted for future laparoscopic cholecystectomy. MRCP 07/10 demonstrated acute pancreatitis and Cholelithiasis with gallbladder wall edema/pericholecystic fluid presumed to be caused by gallbladder sludge. Patient was treated with bowel rest, pain management and supportive care. Lipase returned to a normal value of 35 (07/11). (07/14) surgery consult suggested outpatient laparoscopic cholecystectomy since patient was deemed unstable for surgery. AST and ALT remained elevated for the duration of her hospital course and is most likely due to cholelithiasis and resolving inflammation.   Discharge Vitals:   BP 126/75   Pulse 88   Temp 97.7 F (36.5 C) (Oral)   Resp 14   Ht 5\' 1"  (1.549 m)   Wt 202 lb 9.6 oz (91.9 kg)   SpO2 91%   BMI 38.28 kg/m   Pertinent Labs, Studies, and Procedures:   CBC Latest Ref Rng & Units 05/22/2019 05/21/2019 05/20/2019  WBC 4.0 - 10.5 K/uL 12.8(H) 12.3(H) 14.0(H)  Hemoglobin 12.0 - 15.0 g/dL 8.0(L) 8.6(L) 7.8(L)  Hematocrit 36.0 - 46.0 % 23.8(L) 25.7(L) 23.8(L)  Platelets 150 - 400 K/uL 251 174 135(L)   CMP Latest Ref Rng & Units 05/22/2019 05/21/2019 05/20/2019  Glucose 70 - 99 mg/dL 82 98 134(H)  BUN 8 - 23 mg/dL 20 25(H) 29(H)  Creatinine 0.44 - 1.00 mg/dL 1.72(H) 1.75(H) 2.04(H)  Sodium 135 - 145 mmol/L 128(L) 131(L) 132(L)  Potassium 3.5 - 5.1 mmol/L 4.5 4.0 3.6  Chloride 98 - 111 mmol/L 94(L) 93(L) 95(L)  CO2 22 - 32 mmol/L 25 25 27   Calcium 8.9 - 10.3 mg/dL 8.6(L) 8.8(L) 8.5(L)  Total Protein 6.5 - 8.1 g/dL - 6.1(L) 5.7(L)  Total Bilirubin 0.3 - 1.2 mg/dL - 1.1 0.7  Alkaline Phos 38 - 126 U/L - 215(H) 184(H)  AST 15 - 41 U/L - 35 31  ALT 0 - 44 U/L - 45(H) 51(H)   Iron/TIBC/Ferritin/ %Sat    Component Value Date/Time    IRON 11 (L) 05/20/2019 0806   TIBC 199 (L) 05/20/2019 0806   FERRITIN 727 (H) 05/20/2019 0806   IRONPCTSAT 6 (L) 05/20/2019 0806   RUQ Ultrasound (07/09) Impression:  1. Gallbladder sludge with gallbladder wall thickening and pericholecystic fluid without definite sonographic Murphy sign, which may represent acute cholecystitis. No biliary dilatation. 2. Hepatic steatosis. Two separate focal hypoechoic areas within the liver are indeterminate but may represent focal fatty sparing. Consider elective MRI of the abdomen with and without contrast or short-term ultrasound follow-up.  Abdomen MRCP WO Contrast (07/10) Impression: Motion degraded images. Acute pancreatitis. No organized peripancreatic fluid collection/pseudocyst. Cholelithiasis with gallbladder wall edema/pericholecystic fluid, favored to be reactive. No intrahepatic or extrahepatic ductal dilatation. No choledocholithiasis is seen. Mild wall thickening involving the duodenum, favored to be secondary inflammatory changes, less likely duodenitis. Mild wall thickening involving the ascending colon, poorly evaluated on MR, infectious/inflammatory colitis not excluded.  Chest X-Ray (07/10) Impression: 1. Cardiomegaly with new small bilateral pleural effusions. 2. New scattered airspace opacities bilaterally favored to represent atelectasis, however an infiltrate  is not excluded.  Chest X-ray (07/15) Impression: 1. Patchy infiltrates are again seen in the bases slightly worse on the right than the left. Previously seen changes in the left upper lobe have improved.  Transthoracic Echocardiogram (07/15) Impression:  1. The right ventricle has normal systolc function. The cavity was normal. There is no increase in right ventricular wall thickness.  2. Left atrial size was not assessed.  3. No evidence of mitral valve stenosis.  4. No stenosis of the aortic valve.  5. The aortic root and ascending aorta are normal in size and  structure.  6. The interatrial septum was not assessed.  7. The left ventricle has normal systolic function, with an ejection fraction of 60-65%.  Blood Culture Results: Positive culture for streptococcus gallolyticus (bovis), escherichia coli. Susceptibility:    Escherichia coli Streptococcus gallolyticus    MIC MIC    AMPICILLIN >=32 RESIST... Resistant      AMPICILLIN/SULBACTAM >=32 RESIST... Resistant      CEFAZOLIN <=4 SENSITIVE  Sensitive      CEFEPIME <=1 SENSITIVE  Sensitive      CEFTAZIDIME <=1 SENSITIVE  Sensitive      CEFTRIAXONE <=1 SENSITIVE  Sensitive <=0.12 SENS... Sensitive    CIPROFLOXACIN <=0.25 SENS... Sensitive      ERYTHROMYCIN   <=0.12 SENS... Sensitive    Extended ESBL NEGATIVE  Sensitive      GENTAMICIN <=1 SENSITIVE  Sensitive      IMIPENEM <=0.25 SENS... Sensitive      LEVOFLOXACIN   2 SENSITIVE  Sensitive    PENICILLIN   <=0.06 SENS... Sensitive    PIP/TAZO <=4 SENSITIVE  Sensitive      TRIMETH/SULFA >=320 RESIS... Resistant      VANCOMYCIN   <=0.12 SENS... Sensitive     Discharge Instructions:  Discharge Instructions    Call MD for:  persistant dizziness or light-headedness   Complete by: As directed    Call MD for:  persistant nausea and vomiting   Complete by: As directed    Call MD for:  severe uncontrolled pain   Complete by: As directed    Call MD for:  temperature >100.4   Complete by: As directed    Diet - low sodium heart healthy   Complete by: As directed    Discharge instructions   Complete by: As directed    Thank you for allowing [place] to care for you. Your symptoms were due to a combination of pancreatitis (due to gallstones) and a blood stream infection. - Your were treated with IV Fluids, Antibiotics, pain control, and Bowl rest - You will need to follow up with surgery to plan for removal of your gall bladder to help prevent further episodes - You are being sent home with home health physical therapy and home health nursing, if you  have made other arrangement for nursing please let the home health company know - We recommend continuing with 3 boost shakes per day to help rebuild your strength  We are stopping your metoprolol, your primary doctor may restart this during your follow up with them  Please follow up wit your PCP in the next 1-2 weeks  Please follow up with general surgery as scheduled by them   Increase activity slowly   Complete by: As directed       Signed:  Pearson Grippe, DO IM PGY-3

## 2019-05-21 NOTE — Progress Notes (Signed)
Pt's BP is running higher side latest one 181/86, informed to MD, waiting for the order to be placed  [place], RN

## 2019-05-21 NOTE — Progress Notes (Signed)
Subjective:  [name] was examined at the bedside this morning as she was resting comfortably and watching television. No acute events overnight. She tolerated all her meals without endorsing postprandial pain or using PRN oxycodone tablets.   Patient is okay being discharged tomorrow. We discussed altering her diet from a full liquid to a regular diet in order to observer her tolerance. In addition, she agreed to current plan of home health PT following discharge and having her daughter in-law (CNA) being her home health nurse. Lastly, we mentioned starting her home losartan to manage her BP.   Patient would like for [place] to call her sons today to update them on her current condition.   Objective:  Vital signs in last 24 hours: Vitals:   05/20/19 1930 05/20/19 2346 05/21/19 0354 05/21/19 0757  BP:  (!) 154/87 (!) 155/79 (!) 151/69  Pulse:  71 89 72  Resp: (!) 22 18 16 18   Temp: 98.1 F (36.7 C) 97.9 F (36.6 C) 98.2 F (36.8 C) 98 F (36.7 C)  TempSrc: Oral Oral Oral Oral  SpO2: 93% 95% 98% 91%  Weight:   92.5 kg   Height:        Intake/Output Summary (Last 24 hours) at 05/21/2019 1118 Last data filed at 05/21/2019 7408 Gross per 24 hour  Intake 830 ml  Output 1950 ml  Net -1120 ml   Physical Examination: General: Well appearing, obese female resting in bed.  HEENT: MMM. No JVD. Trachea midline. No conjunctival pallor.   CV: RRR. Normal S1, S2. No M/R/G Pulm: Bilateral rales in lower lung fields. End expiratory wheezing/coughing in upper lung fields Abd: NABS. No tenderness to palpation in abdominal region. Liver tip could not be palpated. Extremities: 2+ dorsalis pedis, radial pulse bilaterally. No peripheral edema noted in upper extremities. Bilateral lower extremity edema noted Neuro: Awake. Oriented x 3.   Assessment/Plan:  Principal Problem:   Gallstone pancreatitis Active Problems:   E coli bacteremia   Shock liver   Septic shock (HCC)   Sepsis without  acute organ dysfunction ([place])   Oxygen dependent   Physical deconditioning  [name] is a 63 y.o female with a PMHx of HTN, HLD, axonal neuropathy, and left renal nephrectomy who is currently receiving management for septic shock due toE. Coli and Strep. gallolyticusin the setting of cholecystitis and pancreatitis. Labs demonstrated WBC continue to decrease, currently 12.3 from 14. Patient is stable on room air and is probable for discharge tomorrow.   Septic shock/bacteremia/hypoxic respiratory failure:Blood cultures from (07/09) detected E.Coli resistant to ampicillin/ampicillin-sulbactam/TMP-SMX.Cultures also detected Strep. gallolyticus with no resistance.07/16 her labs show increased WBC to 17.1 but she remains afebrile with improving RR and normal blood pressures. If her vitals decline consider repeat blood culture. Her TTE from 07/15 demonstrated normal cardiac morphology and normal valvular function. Patient switched from a clear liquid diet to a regular diet today - continue 2g ceftriaxone (last day) -Continue to encourage incentive spirometry/ O2 sat goal > 92% - OT consult  Acute Cholecystitis/Pancreatitis/Elevated Transaminases:Patient underwent a MRCP 07/10 that demonstrated pancreatitis and cholelithiasis, no complications of pancreatitis. Surgery(consulton07/14) will hold surgical intervention until patient is in outpatient setting to avoid potential complications. -start regular diet, monitor tolerance - PRN oxycodone dosage decreased from 5 mg to 2.5 mg  Anemia: Upon arrival 07/09 patient had hemoglobin of 12. Since her arrival, her hemoglobin has steadily dropped most likely due to fluid overload and increased phlebotomy for labs in the ICU/Stepdown unit. Patient's hemoglobin  was 8.4 (07/16). MCV normal at 95.2. Patient is breathing on room air with no distress. CBC this morning shows hemoglobin improved to 8.6 from 7.8 - given 1 time dose of 510 mg feraheme 07/17,  monitor progress with CBC tomorrow   DVT Prophylaxis: Platelets 174 today  - continue 30 mg lovenox sub q injection QD  Hypertension: Patient nearing discharge and has had increased BP several times during her hospital course. This morning her blood pressure was 151/69.  - start patient's home losartan 50 mg tablet QD  - monitor creatinine response   LOS: 9 days   [name], [name], Medical Student 05/21/2019, 11:18 AM

## 2019-05-21 NOTE — Progress Notes (Signed)
Pt's BP still high 196/ 88, paged MD again, on first page they said they will call back but did not hear anything back, paged again, said they will look again, will continue to monitor the patient  Palma Holter, RN

## 2019-05-22 DIAGNOSIS — D696 Thrombocytopenia, unspecified: Secondary | ICD-10-CM

## 2019-05-22 LAB — BASIC METABOLIC PANEL
Anion gap: 9 (ref 5–15)
BUN: 20 mg/dL (ref 8–23)
CO2: 25 mmol/L (ref 22–32)
Calcium: 8.6 mg/dL — ABNORMAL LOW (ref 8.9–10.3)
Chloride: 94 mmol/L — ABNORMAL LOW (ref 98–111)
Creatinine, Ser: 1.72 mg/dL — ABNORMAL HIGH (ref 0.44–1.00)
GFR calc Af Amer: 36 mL/min — ABNORMAL LOW (ref >60)
GFR calc non Af Amer: 31 mL/min — ABNORMAL LOW (ref >60)
Glucose, Bld: 82 mg/dL (ref 70–99)
Potassium: 4.5 mmol/L (ref 3.5–5.1)
Sodium: 128 mmol/L — ABNORMAL LOW (ref 135–145)

## 2019-05-22 LAB — CBC WITH DIFFERENTIAL/PLATELET
Abs Immature Granulocytes: 0.27 10*3/uL — ABNORMAL HIGH (ref 0.00–0.07)
Basophils Absolute: 0.1 10*3/uL (ref 0.0–0.1)
Basophils Relative: 1 %
Eosinophils Absolute: 0.2 10*3/uL (ref 0.0–0.5)
Eosinophils Relative: 2 %
HCT: 23.8 % — ABNORMAL LOW (ref 36.0–46.0)
Hemoglobin: 8 g/dL — ABNORMAL LOW (ref 12.0–15.0)
Immature Granulocytes: 2 %
Lymphocytes Relative: 13 %
Lymphs Abs: 1.6 10*3/uL (ref 0.7–4.0)
MCH: 31.5 pg (ref 26.0–34.0)
MCHC: 33.6 g/dL (ref 30.0–36.0)
MCV: 93.7 fL (ref 80.0–100.0)
Monocytes Absolute: 0.8 10*3/uL (ref 0.1–1.0)
Monocytes Relative: 6 %
Neutro Abs: 9.8 10*3/uL — ABNORMAL HIGH (ref 1.7–7.7)
Neutrophils Relative %: 76 %
Platelets: 251 10*3/uL (ref 150–400)
RBC: 2.54 MIL/uL — ABNORMAL LOW (ref 3.87–5.11)
RDW: 14.7 % (ref 11.5–15.5)
WBC: 12.8 10*3/uL — ABNORMAL HIGH (ref 4.0–10.5)
nRBC: 0.2 % (ref 0.0–0.2)

## 2019-05-22 LAB — MAGNESIUM: Magnesium: 1.4 mg/dL — ABNORMAL LOW (ref 1.7–2.4)

## 2019-05-22 MED ORDER — MAGNESIUM SULFATE 2 GM/50ML IV SOLN
2.0000 g | Freq: Once | INTRAVENOUS | Status: AC
  Administered 2019-05-22: 2 g via INTRAVENOUS
  Filled 2019-05-22: qty 50

## 2019-05-22 NOTE — Progress Notes (Signed)
  Date: 05/22/2019  Patient name: [name]  Medical record number: [id number]  Date of birth: 11-16-1955   I have seen and evaluated this patient and I have discussed the plan of care with the house staff. Please see Dr. Tally Joe note for complete details. I concur with his findings.  Plans for discharge today with follow up with PCP and surgery in the future for evaluation for cholecystectomy.   [name], MD 05/22/2019, 12:07 PM

## 2019-05-22 NOTE — Progress Notes (Signed)
IV discontinued, discharge instructions reviewed and questions answered.  Patient getting dressed and belongings gathered.  RN called son to come pick her up.  Waiting for his arrival

## 2019-05-22 NOTE — TOC Transition Note (Signed)
Transition of Care [facility]) - CM/SW Discharge Note   Patient Details  Name: [name] MRN: [id number] Date of Birth: [birth year]  [facility] CM/SW Contact:  Claudie Leach, RN Phone Number: 05/22/2019, 10:20 AM   Clinical Narrative:    [name] with [facility] aware of dc.    Final next level of care: Home w Home Health Services Barriers to Discharge: Barriers Resolved   Patient Goals and CMS Choice   CMS Medicare.gov Compare Post Acute Care list provided to:: Patient     Discharge Plan and Services     Post Acute Care Choice: Home Health          DME Arranged: 3-N-1, [place] rolling DME Agency: [employer] Date DME Agency Contacted: 05/20/19(by [place] Scrape)     [place] Arranged: PT, RN [facility] Agency: [place] ([place] Date HH Agency Contacted: 05/18/19   Representative spoke with at [place]: [name]

## 2019-05-22 NOTE — Progress Notes (Addendum)
   Subjective:  [name] was seen at bedside this morning. She was feeling well this AA and is ready to go home. She reports some right sided chest which she attributes to her cough and is reproducible on palpation. We discussed the plan for her to follow up with general surgery for gallbladder removal as an outpatient. We also discussed home health; we have arranged for PT and Nursing, but she can refuse nursing if she has made other arrangements wit her daughter in law (who is a CNA).  Objective:  Vital signs in last 24 hours: Vitals:   05/21/19 1900 05/21/19 2350 05/22/19 0320 05/22/19 0436  BP: 130/77 (!) 148/73 125/69   Pulse: 96 87 88   Resp: 14     Temp: 98.4 F (36.9 C) 98.1 F (36.7 C) 98 F (36.7 C)   TempSrc: Oral Oral Oral   SpO2: 98% 93% 91%   Weight:    91.9 kg  Height:        Intake/Output Summary (Last 24 hours) at 05/22/2019 8144 Last data filed at 05/22/2019 0436 Gross per 24 hour  Intake 720 ml  Output 2100 ml  Net -1380 ml   Physical Examination: General: Well appearing, obese female resting in bed.  HEENT: MMM. No JVD. Trachea midline. No conjunctival pallor.   CV: RRR. Normal S1, S2. No M/R/G Pulm: Trace rales at bilat bases Abd: NABS. No tenderness to palpation in abdominal region. Liver tip could not be palpated. Extremities/MSK: 2+ dorsalis pedis, radial pulse bilaterally. No peripheral edema noted in upper extremities. Trace bilateral lower extremity edema. Mild tenderness to palpation of right ribs. Neuro: Awake. Oriented x 3.   Assessment/Plan:  Principal Problem:   Gallstone pancreatitis Active Problems:   E coli bacteremia   Shock liver   Septic shock (HCC)   Sepsis without acute organ dysfunction (HCC)   Oxygen dependent   Physical deconditioning  63 y.o female w/ Hx of HTN, HLD, axonal neuropathy, and left renal nephrectomy admitted for pancreatitis and found to have bacteremia and septic shock due toE. Coli and Strep. gallolyticus.  WBC normalizing, remains afebrile, and patient is stable on room air. Expect discharge today.  Septic shock/bacteremia/hypoxic respiratory failure:Resolving. Blood cultures from (07/09) detected E.Coli  And Strep. Gallolyticus susceptible to Ceftriaxone. Her TTE from 07/15 showed normal EF and valvular function. Patient now toelrating regular diet and has completed antibiotic course.  - PT recommends HH -Incentive spirometry  Acute Cholecystitis/Pancreatitis/Elevated Transaminases:Patient underwent a MRCP 07/10 that demonstrated pancreatitis and cholelithiasis, no complications of pancreatitis. Surgery(consulton07/14) will hold surgical intervention until patient is in outpatient setting to avoid potential complications. - PRN oxycodone 2.5 mg(she has been able to use less of this the last couple of days)  Thrombocytopenia Anemia: Hgb down-trending early in admit 12 > ~8. Suspect component of volume overload and Iron sequestration in acute inflammatory state. She has received IV Fe and her Hgb has stabilized at ~8. (MCV 95.2). Plts downtrended in ICU, Heparin was held 2/2 Plts < 100; believed to be 2/2 sepsis, Plts have now recovered. - Recheck at PCP follow up  Hypertension: Patient nearing discharge and has had increased BP several times during her hospital course. Bp improved today.  - Losartan 50 mg tablet QD   Hypomagnesemia: 2g Magnesium given IV today.  DVT Prophylaxis: Lovenox sub-Q  Anticipated Discharge: Later today with home health   LOS: 10 days   Neva Seat, MD 05/22/2019, 6:25 AM

## 2019-06-09 ENCOUNTER — Ambulatory Visit: Payer: Self-pay | Admitting: General Surgery

## 2019-06-13 ENCOUNTER — Other Ambulatory Visit (HOSPITAL_COMMUNITY): Payer: Medicare Other

## 2019-07-14 ENCOUNTER — Other Ambulatory Visit (HOSPITAL_COMMUNITY): Payer: Medicare Other

## 2019-07-18 ENCOUNTER — Encounter (HOSPITAL_COMMUNITY)
Admission: RE | Admit: 2019-07-18 | Discharge: 2019-07-18 | Disposition: A | Discharge status: Home / Self Care | Payer: Medicare Other | Source: Ambulatory Visit | Attending: General Surgery | Admitting: General Surgery

## 2019-07-18 ENCOUNTER — Other Ambulatory Visit: Payer: Self-pay

## 2019-07-18 ENCOUNTER — Other Ambulatory Visit (HOSPITAL_COMMUNITY): Payer: Medicare Other

## 2019-07-18 ENCOUNTER — Encounter (HOSPITAL_BASED_OUTPATIENT_CLINIC_OR_DEPARTMENT_OTHER): Payer: Self-pay

## 2019-07-18 ENCOUNTER — Other Ambulatory Visit (HOSPITAL_COMMUNITY)
Admission: RE | Admit: 2019-07-18 | Discharge: 2019-07-18 | Disposition: A | Discharge status: Home / Self Care | Payer: Medicare Other | Source: Ambulatory Visit | Attending: General Surgery | Admitting: General Surgery

## 2019-07-18 DIAGNOSIS — I10 Essential (primary) hypertension: Secondary | ICD-10-CM | POA: Diagnosis not present

## 2019-07-18 DIAGNOSIS — R109 Unspecified abdominal pain: Secondary | ICD-10-CM | POA: Diagnosis present

## 2019-07-18 DIAGNOSIS — Z01812 Encounter for preprocedural laboratory examination: Secondary | ICD-10-CM | POA: Insufficient documentation

## 2019-07-18 DIAGNOSIS — K811 Chronic cholecystitis: Secondary | ICD-10-CM | POA: Diagnosis not present

## 2019-07-18 DIAGNOSIS — Z20828 Contact with and (suspected) exposure to other viral communicable diseases: Secondary | ICD-10-CM | POA: Diagnosis not present

## 2019-07-18 DIAGNOSIS — K851 Biliary acute pancreatitis without necrosis or infection: Secondary | ICD-10-CM | POA: Diagnosis not present

## 2019-07-18 DIAGNOSIS — Z905 Acquired absence of kidney: Secondary | ICD-10-CM | POA: Diagnosis not present

## 2019-07-18 DIAGNOSIS — Z8673 Personal history of transient ischemic attack (TIA), and cerebral infarction without residual deficits: Secondary | ICD-10-CM | POA: Diagnosis not present

## 2019-07-18 LAB — COMPREHENSIVE METABOLIC PANEL
ALT: 20 U/L (ref 0–44)
AST: 19 U/L (ref 15–41)
Albumin: 3.8 g/dL (ref 3.5–5.0)
Alkaline Phosphatase: 70 U/L (ref 38–126)
Anion gap: 7 (ref 5–15)
BUN: 36 mg/dL — ABNORMAL HIGH (ref 8–23)
CO2: 23 mmol/L (ref 22–32)
Calcium: 9.2 mg/dL (ref 8.9–10.3)
Chloride: 107 mmol/L (ref 98–111)
Creatinine, Ser: 2.16 mg/dL — ABNORMAL HIGH (ref 0.44–1.00)
GFR calc Af Amer: 27 mL/min — ABNORMAL LOW (ref >60)
GFR calc non Af Amer: 23 mL/min — ABNORMAL LOW (ref >60)
Glucose, Bld: 99 mg/dL (ref 70–99)
Potassium: 4.3 mmol/L (ref 3.5–5.1)
Sodium: 137 mmol/L (ref 135–145)
Total Bilirubin: 0.3 mg/dL (ref 0.3–1.2)
Total Protein: 7.1 g/dL (ref 6.5–8.1)

## 2019-07-18 LAB — CBC WITH DIFFERENTIAL/PLATELET
Abs Immature Granulocytes: 0.01 10*3/uL (ref 0.00–0.07)
Basophils Absolute: 0.1 10*3/uL (ref 0.0–0.1)
Basophils Relative: 1 %
Eosinophils Absolute: 0.2 10*3/uL (ref 0.0–0.5)
Eosinophils Relative: 3 %
HCT: 32.2 % — ABNORMAL LOW (ref 36.0–46.0)
Hemoglobin: 10.1 g/dL — ABNORMAL LOW (ref 12.0–15.0)
Immature Granulocytes: 0 %
Lymphocytes Relative: 45 %
Lymphs Abs: 2.8 10*3/uL (ref 0.7–4.0)
MCH: 31.6 pg (ref 26.0–34.0)
MCHC: 31.4 g/dL (ref 30.0–36.0)
MCV: 100.6 fL — ABNORMAL HIGH (ref 80.0–100.0)
Monocytes Absolute: 0.4 10*3/uL (ref 0.1–1.0)
Monocytes Relative: 7 %
Neutro Abs: 2.8 10*3/uL (ref 1.7–7.7)
Neutrophils Relative %: 44 %
Platelets: 161 10*3/uL (ref 150–400)
RBC: 3.2 MIL/uL — ABNORMAL LOW (ref 3.87–5.11)
RDW: 15.9 % — ABNORMAL HIGH (ref 11.5–15.5)
WBC: 6.3 10*3/uL (ref 4.0–10.5)
nRBC: 0 % (ref 0.0–0.2)

## 2019-07-18 MED ORDER — ENSURE PRE-SURGERY PO LIQD
296.0000 mL | Freq: Once | ORAL | Status: DC
  Filled 2019-07-18: qty 296

## 2019-07-19 ENCOUNTER — Encounter (HOSPITAL_BASED_OUTPATIENT_CLINIC_OR_DEPARTMENT_OTHER): Payer: Self-pay | Admitting: *Deleted

## 2019-07-19 LAB — NOVEL CORONAVIRUS, NAA (HOSP ORDER, SEND-OUT TO REF LAB; TAT 18-24 HRS): SARS-CoV-2, NAA: NOT DETECTED

## 2019-07-19 NOTE — Progress Notes (Addendum)
Spoke w/ pt via phone for pre-op interview.  Npo after mn w/ exception clear liquids until 0630 at which time pt complete ensure pre-surgery drink then nothing by mouth, pt verbalized understanding (pt picked up drink and handout instructions at lab appt).  Arrive at 0730.  Current lab results 07-18-2019 (cbc/diff, cmp) in chart and epic.  Current ekg in chart and epic.  Will take gabapentin, allopurinol, and lovastatin am dos w/ sips of water.   PCP -   dr Mikeal Hawthorne sun Cardiologist -  no  Chest x-ray -  05-18-2019 epic (pt states no oxygen since discharge 05-22-2019) EKG -  05-12-2019 epic Stress Test -  NO ECHO -  05-13-2019 Cardiac Cath -  NO  Sleep Study -  NO CPAP -   Fasting Blood Sugar -  N/A Checks Blood Sugar _____ times a day  Blood Thinner Instructions:  NO Aspirin Instructions:  asa 81 mg Last Dose:  no orders to stop asa  Anesthesia review:   hx HTN,  TIA, and septic shock 05-12-2019. Pt stated septic shock resolved. Pt denies any cardiac s&s, sob, difficulty breathing or s&s of stroke.    Patient denies shortness of breath, fever, cough and chest pain at phone interview.   ADDENDUM:  Chart reviewed by anesthesia, Konrad Felix PA, ok to proceed.

## 2019-07-21 ENCOUNTER — Other Ambulatory Visit: Payer: Self-pay

## 2019-07-21 ENCOUNTER — Encounter (HOSPITAL_BASED_OUTPATIENT_CLINIC_OR_DEPARTMENT_OTHER)
Admission: RE | Disposition: A | Discharge status: Home / Self Care | Payer: Self-pay | Source: Home / Self Care | Attending: General Surgery

## 2019-07-21 ENCOUNTER — Ambulatory Visit (HOSPITAL_COMMUNITY)
Admission: RE | Admit: 2019-07-21 | Discharge: 2019-07-21 | Disposition: A | Discharge status: Home / Self Care | Payer: Medicare Other | Attending: General Surgery | Admitting: General Surgery

## 2019-07-21 ENCOUNTER — Encounter (HOSPITAL_BASED_OUTPATIENT_CLINIC_OR_DEPARTMENT_OTHER): Payer: Self-pay | Admitting: Emergency Medicine

## 2019-07-21 ENCOUNTER — Ambulatory Visit (HOSPITAL_COMMUNITY): Payer: Medicare Other

## 2019-07-21 ENCOUNTER — Ambulatory Visit (HOSPITAL_BASED_OUTPATIENT_CLINIC_OR_DEPARTMENT_OTHER): Payer: Medicare Other | Admitting: Anesthesiology

## 2019-07-21 ENCOUNTER — Ambulatory Visit (HOSPITAL_BASED_OUTPATIENT_CLINIC_OR_DEPARTMENT_OTHER): Payer: Medicare Other | Admitting: Physician Assistant

## 2019-07-21 DIAGNOSIS — I10 Essential (primary) hypertension: Secondary | ICD-10-CM | POA: Insufficient documentation

## 2019-07-21 DIAGNOSIS — Z8673 Personal history of transient ischemic attack (TIA), and cerebral infarction without residual deficits: Secondary | ICD-10-CM | POA: Diagnosis not present

## 2019-07-21 DIAGNOSIS — K811 Chronic cholecystitis: Secondary | ICD-10-CM | POA: Insufficient documentation

## 2019-07-21 DIAGNOSIS — Z905 Acquired absence of kidney: Secondary | ICD-10-CM | POA: Insufficient documentation

## 2019-07-21 HISTORY — PX: CHOLECYSTECTOMY, LAPAROSCOPIC: SHX56

## 2019-07-21 HISTORY — DX: Polyneuropathy, unspecified: G62.9

## 2019-07-21 HISTORY — PX: CHOLECYSTECTOMY: SHX55

## 2019-07-21 HISTORY — DX: Reserved for concepts with insufficient information to code with codable children: IMO0002

## 2019-07-21 HISTORY — DX: Presence of spectacles and contact lenses: Z97.3

## 2019-07-21 HISTORY — DX: Personal history of other malignant neoplasm of kidney: Z85.528

## 2019-07-21 HISTORY — DX: Personal history of other infectious and parasitic diseases: Z86.19

## 2019-07-21 HISTORY — DX: Anemia, unspecified: D64.9

## 2019-07-21 HISTORY — DX: Fatty (change of) liver, not elsewhere classified: K76.0

## 2019-07-21 HISTORY — DX: Personal history of transient ischemic attack (TIA), and cerebral infarction without residual deficits: Z86.73

## 2019-07-21 HISTORY — DX: Personal history of urinary calculi: Z87.442

## 2019-07-21 SURGERY — LAPAROSCOPIC CHOLECYSTECTOMY WITH INTRAOPERATIVE CHOLANGIOGRAM
Anesthesia: General

## 2019-07-21 MED ORDER — FENTANYL CITRATE (PF) 100 MCG/2ML IJ SOLN
INTRAMUSCULAR | Status: AC
  Filled 2019-07-21: qty 2

## 2019-07-21 MED ORDER — HYDROCODONE-ACETAMINOPHEN 5-325 MG PO TABS: 1.0000 | ORAL_TABLET | Freq: Four times a day (QID) | ORAL | 0 refills | Status: DC | PRN

## 2019-07-21 MED ORDER — CHLORHEXIDINE GLUCONATE CLOTH 2 % EX PADS
6.0000 | MEDICATED_PAD | Freq: Once | CUTANEOUS | Status: DC
  Filled 2019-07-21: qty 6

## 2019-07-21 MED ORDER — ONDANSETRON HCL 4 MG/2ML IJ SOLN
INTRAMUSCULAR | Status: DC | PRN
  Administered 2019-07-21: 4 mg via INTRAVENOUS

## 2019-07-21 MED ORDER — ACETAMINOPHEN 500 MG PO TABS
1000.0000 mg | ORAL_TABLET | ORAL | Status: AC
  Administered 2019-07-21: 1000 mg via ORAL
  Filled 2019-07-21: qty 2

## 2019-07-21 MED ORDER — OXYCODONE HCL 5 MG PO TABS
5.0000 mg | ORAL_TABLET | Freq: Once | ORAL | Status: AC | PRN
  Administered 2019-07-21: 5 mg via ORAL
  Filled 2019-07-21: qty 1

## 2019-07-21 MED ORDER — CELECOXIB 200 MG PO CAPS
ORAL_CAPSULE | ORAL | Status: AC
  Filled 2019-07-21: qty 1

## 2019-07-21 MED ORDER — SUGAMMADEX SODIUM 200 MG/2ML IV SOLN
INTRAVENOUS | Status: DC | PRN
  Administered 2019-07-21: 360 mg via INTRAVENOUS

## 2019-07-21 MED ORDER — DEXAMETHASONE SODIUM PHOSPHATE 10 MG/ML IJ SOLN
INTRAMUSCULAR | Status: AC
  Filled 2019-07-21: qty 1

## 2019-07-21 MED ORDER — MIDAZOLAM HCL 2 MG/2ML IJ SOLN
INTRAMUSCULAR | Status: DC | PRN
  Administered 2019-07-21: 2 mg via INTRAVENOUS

## 2019-07-21 MED ORDER — LABETALOL HCL 5 MG/ML IV SOLN
INTRAVENOUS | Status: AC
  Filled 2019-07-21: qty 4

## 2019-07-21 MED ORDER — LACTATED RINGERS IV SOLN
INTRAVENOUS | Status: DC
  Administered 2019-07-21: 08:00:00 via INTRAVENOUS
  Filled 2019-07-21: qty 1000

## 2019-07-21 MED ORDER — EPHEDRINE 5 MG/ML INJ
INTRAVENOUS | Status: AC
  Filled 2019-07-21: qty 10

## 2019-07-21 MED ORDER — ROCURONIUM BROMIDE 10 MG/ML (PF) SYRINGE
PREFILLED_SYRINGE | INTRAVENOUS | Status: DC | PRN
  Administered 2019-07-21: 60 mg via INTRAVENOUS

## 2019-07-21 MED ORDER — ACETAMINOPHEN 500 MG PO TABS
ORAL_TABLET | ORAL | Status: AC
  Filled 2019-07-21: qty 2

## 2019-07-21 MED ORDER — BUPIVACAINE HCL 0.5 % IJ SOLN
INTRAMUSCULAR | Status: DC | PRN
  Administered 2019-07-21: 30 mL

## 2019-07-21 MED ORDER — KETOROLAC TROMETHAMINE 30 MG/ML IJ SOLN
30.0000 mg | Freq: Once | INTRAMUSCULAR | Status: DC | PRN
  Filled 2019-07-21: qty 1

## 2019-07-21 MED ORDER — PROPOFOL 10 MG/ML IV BOLUS
INTRAVENOUS | Status: DC | PRN
  Administered 2019-07-21: 50 mg via INTRAVENOUS

## 2019-07-21 MED ORDER — CELECOXIB 400 MG PO CAPS
400.0000 mg | ORAL_CAPSULE | ORAL | Status: AC
  Administered 2019-07-21: 400 mg via ORAL
  Filled 2019-07-21: qty 1

## 2019-07-21 MED ORDER — ONDANSETRON HCL 4 MG/2ML IJ SOLN
INTRAMUSCULAR | Status: AC
  Filled 2019-07-21: qty 2

## 2019-07-21 MED ORDER — FENTANYL CITRATE (PF) 100 MCG/2ML IJ SOLN
INTRAMUSCULAR | Status: DC | PRN
  Administered 2019-07-21: 50 ug via INTRAVENOUS
  Administered 2019-07-21 (×2): 25 ug via INTRAVENOUS
  Administered 2019-07-21: 50 ug via INTRAVENOUS

## 2019-07-21 MED ORDER — DEXAMETHASONE SODIUM PHOSPHATE 10 MG/ML IJ SOLN
INTRAMUSCULAR | Status: DC | PRN
  Administered 2019-07-21: 5 mg via INTRAVENOUS

## 2019-07-21 MED ORDER — CEFAZOLIN SODIUM-DEXTROSE 2-4 GM/100ML-% IV SOLN
2.0000 g | INTRAVENOUS | Status: AC
  Administered 2019-07-21: 2 g via INTRAVENOUS
  Filled 2019-07-21: qty 100

## 2019-07-21 MED ORDER — FENTANYL CITRATE (PF) 100 MCG/2ML IJ SOLN
25.0000 ug | INTRAMUSCULAR | Status: DC | PRN
  Administered 2019-07-21 (×2): 25 ug via INTRAVENOUS
  Filled 2019-07-21: qty 1

## 2019-07-21 MED ORDER — PROMETHAZINE HCL 25 MG/ML IJ SOLN
6.2500 mg | INTRAMUSCULAR | Status: DC | PRN
  Filled 2019-07-21: qty 1

## 2019-07-21 MED ORDER — MIDAZOLAM HCL 2 MG/2ML IJ SOLN
INTRAMUSCULAR | Status: AC
  Filled 2019-07-21: qty 2

## 2019-07-21 MED ORDER — IBUPROFEN 800 MG PO TABS: 800.0000 mg | ORAL_TABLET | Freq: Three times a day (TID) | ORAL | 0 refills | Status: DC | PRN

## 2019-07-21 MED ORDER — OXYCODONE HCL 5 MG/5ML PO SOLN
5.0000 mg | Freq: Once | ORAL | Status: AC | PRN
  Filled 2019-07-21: qty 5

## 2019-07-21 MED ORDER — CEFAZOLIN SODIUM-DEXTROSE 2-4 GM/100ML-% IV SOLN
INTRAVENOUS | Status: AC
  Filled 2019-07-21: qty 100

## 2019-07-21 MED ORDER — ROCURONIUM BROMIDE 10 MG/ML (PF) SYRINGE
PREFILLED_SYRINGE | INTRAVENOUS | Status: AC
  Filled 2019-07-21: qty 10

## 2019-07-21 MED ORDER — OXYCODONE HCL 5 MG PO TABS
ORAL_TABLET | ORAL | Status: AC
  Filled 2019-07-21: qty 1

## 2019-07-21 MED ORDER — IOHEXOL 300 MG/ML  SOLN
INTRAMUSCULAR | Status: DC | PRN
  Administered 2019-07-21: 10:00:00 9 mL

## 2019-07-21 MED ORDER — LABETALOL HCL 5 MG/ML IV SOLN
INTRAVENOUS | Status: DC | PRN
  Administered 2019-07-21: 5 mg via INTRAVENOUS

## 2019-07-21 MED ORDER — LIDOCAINE 2% (20 MG/ML) 5 ML SYRINGE
INTRAMUSCULAR | Status: DC | PRN
  Administered 2019-07-21: 80 mg via INTRAVENOUS

## 2019-07-21 MED ORDER — LIDOCAINE 2% (20 MG/ML) 5 ML SYRINGE
INTRAMUSCULAR | Status: AC
  Filled 2019-07-21: qty 5

## 2019-07-21 SURGICAL SUPPLY — 44 items
ADH SKN CLS APL DERMABOND .7 (GAUZE/BANDAGES/DRESSINGS) ×1
APPLIER CLIP ROT 10 11.4 M/L (STAPLE)
APR CLP MED LRG 11.4X10 (STAPLE)
BAG SPEC RTRVL 10 TROC 200 (ENDOMECHANICALS) ×1
CABLE HIGH FREQUENCY MONO STRZ (ELECTRODE) ×3 IMPLANT
CATH CHOLANG 76X19 KUMAR (CATHETERS) ×2 IMPLANT
CHLORAPREP W/TINT 26ML (MISCELLANEOUS) ×3 IMPLANT
CLIP APPLIE ROT 10 11.4 M/L (STAPLE) IMPLANT
CLIP VESOLOCK MED LG 6/CT (CLIP) ×5 IMPLANT
COVER WAND RF STERILE (DRAPES) ×3 IMPLANT
DECANTER SPIKE VIAL GLASS SM (MISCELLANEOUS) ×3 IMPLANT
DERMABOND ADVANCED (GAUZE/BANDAGES/DRESSINGS) ×2
DERMABOND ADVANCED .7 DNX12 (GAUZE/BANDAGES/DRESSINGS) ×1 IMPLANT
DRAIN CHANNEL 19F RND (DRAIN) IMPLANT
DRAPE C-ARM 42X72 X-RAY (DRAPES) ×2 IMPLANT
ELECT REM PT RETURN 9FT ADLT (ELECTROSURGICAL) ×3
ELECTRODE REM PT RTRN 9FT ADLT (ELECTROSURGICAL) ×1 IMPLANT
EVACUATOR SILICONE 100CC (DRAIN) IMPLANT
GLOVE BIOGEL PI IND STRL 7.0 (GLOVE) ×1 IMPLANT
GLOVE BIOGEL PI INDICATOR 7.0 (GLOVE) ×2
GLOVE SURG SS PI 7.0 STRL IVOR (GLOVE) ×3 IMPLANT
GOWN STRL REUS W/TWL LRG LVL3 (GOWN DISPOSABLE) ×3 IMPLANT
GRASPER SUT TROCAR 14GX15 (MISCELLANEOUS) ×2 IMPLANT
HEMOSTAT SNOW SURGICEL 2X4 (HEMOSTASIS) IMPLANT
IRRIG SUCT STRYKERFLOW 2 WTIP (MISCELLANEOUS) ×3
IRRIGATION SUCT STRKRFLW 2 WTP (MISCELLANEOUS) ×1 IMPLANT
IV CATH 14GX2 1/4 (CATHETERS) IMPLANT
NS IRRIG 500ML POUR BTL (IV SOLUTION) ×3 IMPLANT
PACK BASIN DAY SURGERY FS (CUSTOM PROCEDURE TRAY) ×3 IMPLANT
POUCH RETRIEVAL ECOSAC 10 (ENDOMECHANICALS) ×1 IMPLANT
POUCH RETRIEVAL ECOSAC 10MM (ENDOMECHANICALS) ×2
SCISSORS LAP 5X35 DISP (ENDOMECHANICALS) ×3 IMPLANT
STOPCOCK 4 WAY LG BORE MALE ST (IV SETS) IMPLANT
SUT ETHILON 2 0 PS N (SUTURE) IMPLANT
SUT MNCRL AB 4-0 PS2 18 (SUTURE) ×5 IMPLANT
SUT VICRYL 0 ENDOLOOP (SUTURE) IMPLANT
SUT VICRYL 0 UR6 27IN ABS (SUTURE) IMPLANT
TOWEL OR 17X26 10 PK STRL BLUE (TOWEL DISPOSABLE) ×6 IMPLANT
TRAY LAPAROSCOPIC (CUSTOM PROCEDURE TRAY) ×3 IMPLANT
TROCAR BLADELESS OPT 12M 100M (ENDOMECHANICALS) IMPLANT
TROCAR BLADELESS OPT 5 100 (ENDOMECHANICALS) ×9 IMPLANT
TROCAR XCEL NON-BLD 11X100MML (ENDOMECHANICALS) ×3 IMPLANT
TUBING EVAC SMOKE HEATED PNEUM (TUBING) ×3 IMPLANT
WARMER LAPAROSCOPE (MISCELLANEOUS) ×3 IMPLANT

## 2019-07-21 NOTE — Anesthesia Procedure Notes (Signed)
Procedure Name: Intubation Date/Time: 07/21/2019 9:14 AM Performed by: Suan Halter, CRNA Pre-anesthesia Checklist: Patient identified, Emergency Drugs available, Suction available and Patient being monitored Patient Re-evaluated:Patient Re-evaluated prior to induction Oxygen Delivery Method: Circle system utilized Preoxygenation: Pre-oxygenation with 100% oxygen Induction Type: IV induction Ventilation: Mask ventilation without difficulty Laryngoscope Size: Mac and 3 Grade View: Grade I Tube type: Oral Tube size: 7.0 mm Number of attempts: 1 Airway Equipment and Method: Stylet and Oral airway Placement Confirmation: ETT inserted through vocal cords under direct vision,  positive ETCO2 and breath sounds checked- equal and bilateral Secured at: 21 cm Tube secured with: Tape Dental Injury: Teeth and Oropharynx as per pre-operative assessment

## 2019-07-21 NOTE — Anesthesia Postprocedure Evaluation (Signed)
Anesthesia Post Note  Patient: [name]  Procedure(s) Performed: LAPAROSCOPIC CHOLECYSTECTOMY WITH INTRAOPERATIVE CHOLANGIOGRAM (N/A )     Patient location during evaluation: PACU Anesthesia Type: General Level of consciousness: awake and alert Pain management: pain level controlled Vital Signs Assessment: post-procedure vital signs reviewed and stable Respiratory status: spontaneous breathing, nonlabored ventilation, respiratory function stable and patient connected to nasal cannula oxygen Cardiovascular status: blood pressure returned to baseline and stable Postop Assessment: no apparent nausea or vomiting Anesthetic complications: no    Last Vitals:  Vitals:   07/21/19 1030 07/21/19 1045  BP: (!) 161/85 (!) 159/88  Pulse: 79 76  Resp: 13 11  Temp:    SpO2: 99% 97%    Last Pain:  Vitals:   07/21/19 1045  TempSrc:   PainSc: Asleep                 Benedicto Capozzi S

## 2019-07-21 NOTE — Op Note (Signed)
PATIENT:  [name]  63 y.o. female  PRE-OPERATIVE DIAGNOSIS:  GALLSTONE PANCREATITIS  POST-OPERATIVE DIAGNOSIS:  GALLSTONE PANCREATITIS  PROCEDURE:  Procedure(s): LAPAROSCOPIC CHOLECYSTECTOMY WITH INTRAOPERATIVE CHOLANGIOGRAM   SURGEON:  Surgeon(s): Joyanne Eddinger, [name], MD  ASSISTANT: Jeralyn Bennett  ANESTHESIA:   local and general  Indications for procedure: [name] is a 63 y.o. female with symptoms of Abdominal pain consistent with gallbladder disease, Confirmed by Ultrasound and CT.  Description of procedure: The patient was brought into the operative suite, placed supine. Anesthesia was administered with endotracheal tube. Patient was strapped in place and foot board was secured. All pressure points were offloaded by foam padding. The patient was prepped and draped in the usual sterile fashion.  A small incision was made in the left upper quadrant. A 15mm trocar was inserted into the peritoneal cavity with optical entry. Pneumoperitoneum was applied with high flow low pressure. There was adhesive disease around the umbilicus that was taken down sharply. 1 69mm trocar was placed in the periumbilical space. 1 67mm trocar was placed in the right lateral space. A 80mm trocar was placed in the subxiphoid space. Marcaine was infused to the subxiphoid space and lateral upper right abdomen in the transversus abdominis plane. Next the patient was placed in reverse trendelenberg. The gallbladder was white consistent with chronic inflammation  The gallbladder was retracted cephalad and lateral. The peritoneum was reflected off the infundibulum working lateral to medial. The cystic duct and cystic artery were identified and further dissection revealed a critical view, due to concern for choledocholithiasis a cholangiogram was performed with [place]. Normal ductal anatomy was seen. The cystic duct and cystic artery were doubly clipped and ligated.   The gallbladder was removed  off the liver bed with cautery. The Gallbladder was placed in a specimen bag. The gallbladder fossa was irrigated and hemostasis was applied with cautery. The gallbladder was removed via the 50mm trocar. The fascial defect was closed with interrupted 0 vicryl suture via laparoscopic trans-fascial suture passer. Pneumoperitoneum was removed, all trocar were removed. All incisions were closed with 4-0 monocryl subcuticular stitch. The patient woke from anesthesia and was brought to PACU in stable condition. All counts were correct  Findings: normal ductal anatomy  Specimen: gallbladder  Blood loss: 30 ml  Local anesthesia: 20 ml marcaine  Complications: none  PLAN OF CARE: Discharge to home after PACU  PATIENT DISPOSITION:  PACU - hemodynamically stable.  Gurney Maxin, M.D. General, Bariatric, & Minimally Invasive Surgery [facility]

## 2019-07-21 NOTE — Discharge Instructions (Signed)
HOME CARE INSTRUCTIONS - LAPAROSCOPY  Wound Care: The bandaids or dressing which are placed over the skin openings may be removed the day after surgery. The incision should be kept clean and dry. The stitches do not need to be removed. Should the incision become sore, red, and swollen after the first week, check with your doctor.  Personal Hygiene: Shower the day after your procedure. Always wipe from front to back after elimination.   Activity: Do not drive or operate any equipment today. The effects of the anesthesia are still present and drowsiness may result. Rest today, not necessarily flat bed rest, just take it easy. You may resume your normal activity in one to three days or as instructed by your physician. . Diet: Eat a light diet as desired this evening. You may resume a regular diet tomorrow.  Return to Work:  as indicated by your doctor.  Expectations . Mild abdominal discomfort or tenderness is not unusual and some shoulder pain may also be noted which can be relieved by lying flat in pain.  Call Your Doctor If these Occur:  Persistent or heavy bleeding at incision site       Redness or swelling around incision       Elevation of temperature greater than 100 degrees F   Post Anesthesia Home Care Instructions  Activity: Get plenty of rest for the remainder of the day. A responsible adult should stay with you for 24 hours following the procedure.  For the next 24 hours, DO NOT: -Drive a car -Paediatric nurse -Drink alcoholic beverages -Take any medication unless instructed by your physician -Make any legal decisions or sign important papers.  Meals: Start with liquid foods such as gelatin or soup. Progress to regular foods as tolerated. Avoid greasy, spicy, heavy foods. If nausea and/or vomiting occur, drink only clear liquids until the nausea and/or vomiting subsides. Call your physician if vomiting continues.  Special Instructions/Symptoms: Your throat may feel dry  or sore from the anesthesia or the breathing tube placed in your throat during surgery. If this causes discomfort, gargle with warm salt water. The discomfort should disappear within 24 hours.  If you had a scopolamine patch placed behind your ear for the management of post- operative nausea and/or vomiting:  1. The medication in the patch is effective for 72 hours, after which it should be removed.  Wrap patch in a tissue and discard in the trash. Wash hands thoroughly with soap and water. 2. You may remove the patch earlier than 72 hours if you experience unpleasant side effects which may include dry mouth, dizziness or visual disturbances. 3. Avoid touching the patch. Wash your hands with soap and water after contact with the patch.

## 2019-07-21 NOTE — Progress Notes (Signed)
Pt and her sister instructed not to take hydrocodone until after 4:30 pm today.

## 2019-07-21 NOTE — Anesthesia Preprocedure Evaluation (Signed)
Anesthesia Evaluation  Patient identified by MRN, date of birth, ID band Patient awake    Reviewed: Allergy & Precautions, NPO status , Patient's Chart, lab work & pertinent test results  Airway Mallampati: II  TM Distance: >3 FB Neck ROM: Full    Dental no notable dental hx.    Pulmonary neg pulmonary ROS,    Pulmonary exam normal breath sounds clear to auscultation       Cardiovascular hypertension, Normal cardiovascular exam Rhythm:Regular Rate:Normal     Neuro/Psych TIAnegative psych ROS   GI/Hepatic negative GI ROS, Neg liver ROS,   Endo/Other  negative endocrine ROS  Renal/GU Renal InsufficiencyRenal disease  negative genitourinary   Musculoskeletal negative musculoskeletal ROS (+)   Abdominal   Peds negative pediatric ROS (+)  Hematology  (+) anemia ,   Anesthesia Other Findings   Reproductive/Obstetrics negative OB ROS                             Anesthesia Physical Anesthesia Plan  ASA: III  Anesthesia Plan: General   Post-op Pain Management:    Induction: Intravenous  PONV Risk Score and Plan: 3 and Ondansetron, Dexamethasone and Treatment may vary due to age or medical condition  Airway Management Planned: Oral ETT  Additional Equipment:   Intra-op Plan:   Post-operative Plan: Extubation in OR  Informed Consent: I have reviewed the patients History and Physical, chart, labs and discussed the procedure including the risks, benefits and alternatives for the proposed anesthesia with the patient or authorized representative who has indicated his/her understanding and acceptance.     Dental advisory given  Plan Discussed with: CRNA and Surgeon  Anesthesia Plan Comments:         Anesthesia Quick Evaluation

## 2019-07-21 NOTE — H&P (Signed)
Reason for Consult: abdominal pain  [name] is an 63 y.o. female.  HPI: 63 yo female with intermittent epigastric pain. On work up she was found to have gallstone pancreatitis. At the time of pancreatitis she was malnourished and we delayed cholecystectomy. She has improved nutritionally and other medical problems are optimized.  Past Medical History:  Diagnosis Date  . Anemia   . Fatty liver   . History of gout    last episode left foot 2010  . History of kidney stones   . History of septic shock    05-12-2019  w/ bacteremia;  gallstone pancreatitis--- resolved  . History of transient ischemic attack (TIA)    04-16-2008  --  per pt no residual  . Hyperlipidemia   . Hypertension   . Peripheral neuropathy   . Personal history of renal cell carcinoma urologist-  dr Tresa Moore   07-17-2005  s/p  left nephrectomy , clear cell type  . Solitary right kidney    acquired;  left nephrectomy 07-18-2015 for RCC  . Wears glasses     Past Surgical History:  Procedure Laterality Date  . CESAREAN SECTION  x2  yrs ago  . CYSTOSCOPY WITH RETROGRADE PYELOGRAM, URETEROSCOPY AND STENT PLACEMENT Bilateral 06/08/2015   Procedure: CYSTOSCOPY WITH RETROGRADE PYELOGRAM, URETEROSCOPY, STONE EXTRACTION AND STENT PLACEMENT ON THE RIGHT, DIAGNOSTIC URETEROSCOPY AND STENT PLACEMENT ON THE LEFT;  Surgeon: Alexis Frock, MD;  Location: [facility];  Service: Urology;  Laterality: Bilateral;  . HOLMIUM LASER APPLICATION Right 0/12/4095   Procedure: HOLMIUM LASER APPLICATION;  Surgeon: Alexis Frock, MD;  Location: [facility];  Service: Urology;  Laterality: Right;  . ROBOT ASSISTED LAPAROSCOPIC NEPHRECTOMY Left 07/18/2015   Procedure: ROBOTIC ASSISTED LAPAROSCOPIC NEPHRECTOMY;  Surgeon: Alexis Frock, MD;  Location: [facility];  Service: Urology;  Laterality: Left;  . TUBAL LIGATION Bilateral yrs ago    Family History  Problem Relation Age of Onset  . Hypertension Mother   . Gout Mother   . Pancreatitis Sister     Social History:  reports that she has never smoked. She has never used smokeless tobacco. She reports that she does not drink alcohol or use drugs.  Allergies: No Known Allergies  Medications: I have reviewed the patient's current medications.  No results found for this or any previous visit (from the past 48 hour(s)).  No results found.  Review of Systems  Constitutional: Negative for chills and fever.  HENT: Negative for hearing loss.   Eyes: Negative for blurred vision and double vision.  Respiratory: Negative for cough and hemoptysis.   Cardiovascular: Negative for chest pain and palpitations.  Gastrointestinal: Negative for abdominal pain, nausea and vomiting.  Genitourinary: Negative for dysuria and urgency.  Musculoskeletal: Negative for myalgias and neck pain.  Skin: Negative for itching and rash.  Neurological: Negative for dizziness, tingling and headaches.  Endo/Heme/Allergies: Does not bruise/bleed easily.  Psychiatric/Behavioral: Negative for depression and suicidal ideas.   Blood pressure (!) 175/84, pulse 70, temperature 97.6 F (36.4 C), temperature source Oral, resp. rate 16, height 5\' 1"  (1.549 m), weight 88.5 kg, SpO2 98 %. Physical Exam  Vitals reviewed. Constitutional: She is oriented to person, place, and time. She appears well-developed and well-nourished.  HENT:  Head: Normocephalic and atraumatic.  Eyes: Pupils are equal, round, and reactive to light. Conjunctivae and EOM are normal.  Neck: Normal range of motion. Neck supple.  Cardiovascular: Normal rate and regular rhythm.  Respiratory: Effort normal and breath sounds normal.  GI:  Soft. Bowel sounds are normal. She exhibits no distension. There is no abdominal tenderness.  Musculoskeletal: Normal range of motion.  Neurological: She is alert and oriented to person, place, and time.  Skin: Skin is warm and dry.  Psychiatric: She has a normal mood and affect. Her behavior is normal.    Assessment/Plan: 63 yo female with history of gallstone pancreatitis -lap chole w ioc today -planned outpatient procedure  [name]  07/21/2019, 8:49 AM

## 2019-07-21 NOTE — Transfer of Care (Signed)
Immediate Anesthesia Transfer of Care Note  Patient: [name]  Procedure(s) Performed: Procedure(s) (LRB): LAPAROSCOPIC CHOLECYSTECTOMY WITH INTRAOPERATIVE CHOLANGIOGRAM (N/A)  Patient Location: PACU  Anesthesia Type: General  Level of Consciousness: awake, oriented, sedated and patient cooperative  Airway & Oxygen Therapy: Patient Spontanous Breathing and Patient connected to face mask oxygen  Post-op Assessment: Report given to PACU RN and Post -op Vital signs reviewed and stable  Post vital signs: Reviewed and stable  Complications: No apparent anesthesia complications  Last Vitals:  Vitals Value Taken Time  BP 158/88 07/21/19 1025  Temp    Pulse 84 07/21/19 1028  Resp 14 07/21/19 1028  SpO2 100 % 07/21/19 1028  Vitals shown include unvalidated device data.  Last Pain:  Vitals:   07/21/19 0755  TempSrc: Oral      Patients Stated Pain Goal: 4 (07/21/19 0755)

## 2019-07-22 ENCOUNTER — Encounter (HOSPITAL_BASED_OUTPATIENT_CLINIC_OR_DEPARTMENT_OTHER): Payer: Self-pay | Admitting: General Surgery

## 2019-07-25 LAB — SURGICAL PATHOLOGY

## 2020-06-08 ENCOUNTER — Other Ambulatory Visit: Payer: Self-pay

## 2020-06-08 ENCOUNTER — Emergency Department (HOSPITAL_COMMUNITY)
Admission: EM | Admit: 2020-06-08 | Discharge: 2020-06-08 | Disposition: A | Discharge status: Home / Self Care | Payer: Medicare Other | Attending: Emergency Medicine | Admitting: Emergency Medicine

## 2020-06-08 ENCOUNTER — Encounter (HOSPITAL_COMMUNITY): Payer: Self-pay

## 2020-06-08 DIAGNOSIS — Z5321 Procedure and treatment not carried out due to patient leaving prior to being seen by health care provider: Secondary | ICD-10-CM | POA: Insufficient documentation

## 2020-06-08 DIAGNOSIS — Y939 Activity, unspecified: Secondary | ICD-10-CM | POA: Diagnosis not present

## 2020-06-08 DIAGNOSIS — Y929 Unspecified place or not applicable: Secondary | ICD-10-CM | POA: Insufficient documentation

## 2020-06-08 DIAGNOSIS — H5711 Ocular pain, right eye: Secondary | ICD-10-CM | POA: Insufficient documentation

## 2020-06-08 DIAGNOSIS — W01198A Fall on same level from slipping, tripping and stumbling with subsequent striking against other object, initial encounter: Secondary | ICD-10-CM | POA: Diagnosis not present

## 2020-06-08 DIAGNOSIS — Y999 Unspecified external cause status: Secondary | ICD-10-CM | POA: Insufficient documentation

## 2020-06-08 NOTE — ED Notes (Signed)
Leaving AMA.

## 2020-06-08 NOTE — ED Triage Notes (Addendum)
Pt arrives to ED w/ c/o mechanical fall that happened a couple of hours ago. Pt tripped and hit R cheek/eye on railing. Pt denies loc, denies blood thinners. Pt reports 6/10 pain. Pt AOx4, neuro intact.

## 2021-12-04 ENCOUNTER — Inpatient Hospital Stay (HOSPITAL_COMMUNITY): Payer: Medicare Other | Admitting: Certified Registered Nurse Anesthetist

## 2021-12-04 ENCOUNTER — Inpatient Hospital Stay (HOSPITAL_COMMUNITY): Payer: Medicare Other

## 2021-12-04 ENCOUNTER — Inpatient Hospital Stay (HOSPITAL_COMMUNITY)
Admission: EM | Admit: 2021-12-04 | Discharge: 2021-12-18 | DRG: 957 | Disposition: A | Discharge status: Skilled Nursing Facility | Payer: Medicare Other | Attending: General Surgery | Admitting: General Surgery

## 2021-12-04 ENCOUNTER — Encounter (HOSPITAL_COMMUNITY): Payer: Self-pay | Admitting: Surgery

## 2021-12-04 ENCOUNTER — Encounter (HOSPITAL_COMMUNITY): Admission: EM | Disposition: A | Discharge status: Skilled Nursing Facility | Payer: Self-pay | Source: Home / Self Care

## 2021-12-04 ENCOUNTER — Other Ambulatory Visit: Payer: Self-pay

## 2021-12-04 ENCOUNTER — Emergency Department (HOSPITAL_COMMUNITY): Payer: Medicare Other

## 2021-12-04 DIAGNOSIS — C801 Malignant (primary) neoplasm, unspecified: Secondary | ICD-10-CM | POA: Diagnosis not present

## 2021-12-04 DIAGNOSIS — S82202B Unspecified fracture of shaft of left tibia, initial encounter for open fracture type I or II: Secondary | ICD-10-CM | POA: Diagnosis present

## 2021-12-04 DIAGNOSIS — S2241XA Multiple fractures of ribs, right side, initial encounter for closed fracture: Secondary | ICD-10-CM

## 2021-12-04 DIAGNOSIS — Z79899 Other long term (current) drug therapy: Secondary | ICD-10-CM

## 2021-12-04 DIAGNOSIS — E785 Hyperlipidemia, unspecified: Secondary | ICD-10-CM | POA: Diagnosis present

## 2021-12-04 DIAGNOSIS — T148XXA Other injury of unspecified body region, initial encounter: Secondary | ICD-10-CM

## 2021-12-04 DIAGNOSIS — C7989 Secondary malignant neoplasm of other specified sites: Secondary | ICD-10-CM | POA: Diagnosis not present

## 2021-12-04 DIAGNOSIS — T07XXXA Unspecified multiple injuries, initial encounter: Secondary | ICD-10-CM | POA: Diagnosis present

## 2021-12-04 DIAGNOSIS — J9602 Acute respiratory failure with hypercapnia: Secondary | ICD-10-CM | POA: Diagnosis present

## 2021-12-04 DIAGNOSIS — S82401A Unspecified fracture of shaft of right fibula, initial encounter for closed fracture: Secondary | ICD-10-CM | POA: Diagnosis present

## 2021-12-04 DIAGNOSIS — G629 Polyneuropathy, unspecified: Secondary | ICD-10-CM | POA: Diagnosis present

## 2021-12-04 DIAGNOSIS — C787 Secondary malignant neoplasm of liver and intrahepatic bile duct: Secondary | ICD-10-CM | POA: Diagnosis present

## 2021-12-04 DIAGNOSIS — Z6838 Body mass index (BMI) 38.0-38.9, adult: Secondary | ICD-10-CM

## 2021-12-04 DIAGNOSIS — I129 Hypertensive chronic kidney disease with stage 1 through stage 4 chronic kidney disease, or unspecified chronic kidney disease: Secondary | ICD-10-CM | POA: Diagnosis present

## 2021-12-04 DIAGNOSIS — S82141A Displaced bicondylar fracture of right tibia, initial encounter for closed fracture: Secondary | ICD-10-CM | POA: Diagnosis present

## 2021-12-04 DIAGNOSIS — N184 Chronic kidney disease, stage 4 (severe): Secondary | ICD-10-CM | POA: Diagnosis present

## 2021-12-04 DIAGNOSIS — Y92410 Unspecified street and highway as the place of occurrence of the external cause: Secondary | ICD-10-CM | POA: Diagnosis not present

## 2021-12-04 DIAGNOSIS — R402362 Coma scale, best motor response, obeys commands, at arrival to emergency department: Secondary | ICD-10-CM | POA: Diagnosis present

## 2021-12-04 DIAGNOSIS — Z20822 Contact with and (suspected) exposure to covid-19: Secondary | ICD-10-CM | POA: Diagnosis present

## 2021-12-04 DIAGNOSIS — Z9911 Dependence on respirator [ventilator] status: Secondary | ICD-10-CM

## 2021-12-04 DIAGNOSIS — S82832B Other fracture of upper and lower end of left fibula, initial encounter for open fracture type I or II: Secondary | ICD-10-CM | POA: Diagnosis present

## 2021-12-04 DIAGNOSIS — M79605 Pain in left leg: Secondary | ICD-10-CM | POA: Diagnosis not present

## 2021-12-04 DIAGNOSIS — J9690 Respiratory failure, unspecified, unspecified whether with hypoxia or hypercapnia: Secondary | ICD-10-CM

## 2021-12-04 DIAGNOSIS — Z905 Acquired absence of kidney: Secondary | ICD-10-CM

## 2021-12-04 DIAGNOSIS — S065XAA Traumatic subdural hemorrhage with loss of consciousness status unknown, initial encounter: Secondary | ICD-10-CM | POA: Diagnosis present

## 2021-12-04 DIAGNOSIS — C19 Malignant neoplasm of rectosigmoid junction: Secondary | ICD-10-CM | POA: Diagnosis present

## 2021-12-04 DIAGNOSIS — S12130A Unspecified traumatic displaced spondylolisthesis of second cervical vertebra, initial encounter for closed fracture: Secondary | ICD-10-CM

## 2021-12-04 DIAGNOSIS — J9811 Atelectasis: Secondary | ICD-10-CM | POA: Diagnosis present

## 2021-12-04 DIAGNOSIS — T1490XA Injury, unspecified, initial encounter: Secondary | ICD-10-CM

## 2021-12-04 DIAGNOSIS — D696 Thrombocytopenia, unspecified: Secondary | ICD-10-CM | POA: Diagnosis present

## 2021-12-04 DIAGNOSIS — M25519 Pain in unspecified shoulder: Secondary | ICD-10-CM

## 2021-12-04 DIAGNOSIS — J9601 Acute respiratory failure with hypoxia: Secondary | ICD-10-CM | POA: Diagnosis present

## 2021-12-04 DIAGNOSIS — S99912A Unspecified injury of left ankle, initial encounter: Secondary | ICD-10-CM

## 2021-12-04 DIAGNOSIS — K559 Vascular disorder of intestine, unspecified: Secondary | ICD-10-CM | POA: Diagnosis present

## 2021-12-04 DIAGNOSIS — R578 Other shock: Secondary | ICD-10-CM | POA: Diagnosis present

## 2021-12-04 DIAGNOSIS — Z8673 Personal history of transient ischemic attack (TIA), and cerebral infarction without residual deficits: Secondary | ICD-10-CM

## 2021-12-04 DIAGNOSIS — M79604 Pain in right leg: Secondary | ICD-10-CM | POA: Diagnosis not present

## 2021-12-04 DIAGNOSIS — Z23 Encounter for immunization: Secondary | ICD-10-CM

## 2021-12-04 DIAGNOSIS — E669 Obesity, unspecified: Secondary | ICD-10-CM | POA: Diagnosis present

## 2021-12-04 DIAGNOSIS — R0902 Hypoxemia: Secondary | ICD-10-CM

## 2021-12-04 DIAGNOSIS — S9305XA Dislocation of left ankle joint, initial encounter: Secondary | ICD-10-CM

## 2021-12-04 DIAGNOSIS — S36892A Contusion of other intra-abdominal organs, initial encounter: Secondary | ICD-10-CM | POA: Diagnosis present

## 2021-12-04 DIAGNOSIS — R402142 Coma scale, eyes open, spontaneous, at arrival to emergency department: Secondary | ICD-10-CM | POA: Diagnosis present

## 2021-12-04 DIAGNOSIS — S2243XA Multiple fractures of ribs, bilateral, initial encounter for closed fracture: Secondary | ICD-10-CM | POA: Diagnosis present

## 2021-12-04 DIAGNOSIS — R402252 Coma scale, best verbal response, oriented, at arrival to emergency department: Secondary | ICD-10-CM | POA: Diagnosis present

## 2021-12-04 DIAGNOSIS — S36030A Superficial (capsular) laceration of spleen, initial encounter: Principal | ICD-10-CM | POA: Diagnosis present

## 2021-12-04 DIAGNOSIS — Z419 Encounter for procedure for purposes other than remedying health state, unspecified: Secondary | ICD-10-CM

## 2021-12-04 DIAGNOSIS — S12100A Unspecified displaced fracture of second cervical vertebra, initial encounter for closed fracture: Secondary | ICD-10-CM | POA: Diagnosis present

## 2021-12-04 DIAGNOSIS — Z09 Encounter for follow-up examination after completed treatment for conditions other than malignant neoplasm: Secondary | ICD-10-CM

## 2021-12-04 DIAGNOSIS — E874 Mixed disorder of acid-base balance: Secondary | ICD-10-CM | POA: Diagnosis present

## 2021-12-04 DIAGNOSIS — Z8781 Personal history of (healed) traumatic fracture: Secondary | ICD-10-CM

## 2021-12-04 DIAGNOSIS — I959 Hypotension, unspecified: Secondary | ICD-10-CM | POA: Diagnosis present

## 2021-12-04 DIAGNOSIS — D62 Acute posthemorrhagic anemia: Secondary | ICD-10-CM | POA: Diagnosis present

## 2021-12-04 DIAGNOSIS — E889 Metabolic disorder, unspecified: Secondary | ICD-10-CM | POA: Diagnosis present

## 2021-12-04 HISTORY — PX: LAPAROTOMY: SHX154

## 2021-12-04 HISTORY — DX: Malignant neoplasm of unspecified kidney, except renal pelvis: C64.9

## 2021-12-04 HISTORY — DX: Essential (primary) hypertension: I10

## 2021-12-04 HISTORY — DX: Hyperlipidemia, unspecified: E78.5

## 2021-12-04 HISTORY — DX: Transient cerebral ischemic attack, unspecified: G45.9

## 2021-12-04 HISTORY — DX: Polyneuropathy, unspecified: G62.9

## 2021-12-04 HISTORY — PX: ANKLE CLOSED REDUCTION: SHX880

## 2021-12-04 LAB — POCT I-STAT EG7
Acid-base deficit: 13 mmol/L — ABNORMAL HIGH (ref 0.0–2.0)
Bicarbonate: 15 mmol/L — ABNORMAL LOW (ref 20.0–28.0)
Calcium, Ion: 1.07 mmol/L — ABNORMAL LOW (ref 1.15–1.40)
HCT: 35 % — ABNORMAL LOW (ref 36.0–46.0)
Hemoglobin: 11.9 g/dL — ABNORMAL LOW (ref 12.0–15.0)
O2 Saturation: 99 %
Patient temperature: 33.7
Potassium: 4 mmol/L (ref 3.5–5.1)
Sodium: 140 mmol/L (ref 135–145)
TCO2: 16 mmol/L — ABNORMAL LOW (ref 22–32)
pCO2, Ven: 36.8 mmHg — ABNORMAL LOW (ref 44.0–60.0)
pH, Ven: 7.199 — CL (ref 7.250–7.430)
pO2, Ven: 133 mmHg — ABNORMAL HIGH (ref 32.0–45.0)

## 2021-12-04 LAB — CBC
HCT: 26.8 % — ABNORMAL LOW (ref 36.0–46.0)
HCT: 36 % (ref 36.0–46.0)
Hemoglobin: 8.8 g/dL — ABNORMAL LOW (ref 12.0–15.0)
Hemoglobin: 12 g/dL (ref 12.0–15.0)
MCH: 34.5 pg — ABNORMAL HIGH (ref 26.0–34.0)
MCH: 31 pg (ref 26.0–34.0)
MCHC: 32.8 g/dL (ref 30.0–36.0)
MCHC: 33.3 g/dL (ref 30.0–36.0)
MCV: 105.1 fL — ABNORMAL HIGH (ref 80.0–100.0)
MCV: 93 fL (ref 80.0–100.0)
Platelets: 189 10*3/uL (ref 150–400)
Platelets: 121 10*3/uL — ABNORMAL LOW (ref 150–400)
RBC: 2.55 MIL/uL — ABNORMAL LOW (ref 3.87–5.11)
RBC: 3.87 MIL/uL (ref 3.87–5.11)
RDW: 15.7 % — ABNORMAL HIGH (ref 11.5–15.5)
RDW: 17.1 % — ABNORMAL HIGH (ref 11.5–15.5)
WBC: 15.8 10*3/uL — ABNORMAL HIGH (ref 4.0–10.5)
WBC: 8.5 10*3/uL (ref 4.0–10.5)
nRBC: 0 % (ref 0.0–0.2)
nRBC: 0 % (ref 0.0–0.2)

## 2021-12-04 LAB — POCT I-STAT 7, (LYTES, BLD GAS, ICA,H+H)
Acid-base deficit: 13 mmol/L — ABNORMAL HIGH (ref 0.0–2.0)
Bicarbonate: 15.4 mmol/L — ABNORMAL LOW (ref 20.0–28.0)
Calcium, Ion: 1.19 mmol/L (ref 1.15–1.40)
HCT: 31 % — ABNORMAL LOW (ref 36.0–46.0)
Hemoglobin: 10.5 g/dL — ABNORMAL LOW (ref 12.0–15.0)
O2 Saturation: 99 %
Potassium: 4.4 mmol/L (ref 3.5–5.1)
Sodium: 139 mmol/L (ref 135–145)
TCO2: 17 mmol/L — ABNORMAL LOW (ref 22–32)
pCO2 arterial: 43.4 mmHg (ref 32.0–48.0)
pH, Arterial: 7.157 — CL (ref 7.350–7.450)
pO2, Arterial: 152 mmHg — ABNORMAL HIGH (ref 83.0–108.0)

## 2021-12-04 LAB — BASIC METABOLIC PANEL
Anion gap: 10 (ref 5–15)
BUN: 27 mg/dL — ABNORMAL HIGH (ref 8–23)
CO2: 16 mmol/L — ABNORMAL LOW (ref 22–32)
Calcium: 6.8 mg/dL — ABNORMAL LOW (ref 8.9–10.3)
Chloride: 110 mmol/L (ref 98–111)
Creatinine, Ser: 1.88 mg/dL — ABNORMAL HIGH (ref 0.44–1.00)
GFR, Estimated: 29 mL/min — ABNORMAL LOW (ref >60)
Glucose, Bld: 158 mg/dL — ABNORMAL HIGH (ref 70–99)
Potassium: 3.6 mmol/L (ref 3.5–5.1)
Sodium: 136 mmol/L (ref 135–145)

## 2021-12-04 LAB — I-STAT CHEM 8, ED
BUN: 44 mg/dL — ABNORMAL HIGH (ref 8–23)
Calcium, Ion: 1.04 mmol/L — ABNORMAL LOW (ref 1.15–1.40)
Chloride: 114 mmol/L — ABNORMAL HIGH (ref 98–111)
Creatinine, Ser: 2.5 mg/dL — ABNORMAL HIGH (ref 0.44–1.00)
Glucose, Bld: 135 mg/dL — ABNORMAL HIGH (ref 70–99)
HCT: 28 % — ABNORMAL LOW (ref 36.0–46.0)
Hemoglobin: 9.5 g/dL — ABNORMAL LOW (ref 12.0–15.0)
Potassium: 4.6 mmol/L (ref 3.5–5.1)
Sodium: 136 mmol/L (ref 135–145)
TCO2: 17 mmol/L — ABNORMAL LOW (ref 22–32)

## 2021-12-04 LAB — ABO/RH: ABO/RH(D): A POS

## 2021-12-04 LAB — COMPREHENSIVE METABOLIC PANEL
ALT: 20 U/L (ref 0–44)
AST: 32 U/L (ref 15–41)
Albumin: 2.4 g/dL — ABNORMAL LOW (ref 3.5–5.0)
Alkaline Phosphatase: 51 U/L (ref 38–126)
Anion gap: 18 — ABNORMAL HIGH (ref 5–15)
BUN: 28 mg/dL — ABNORMAL HIGH (ref 8–23)
CO2: 11 mmol/L — ABNORMAL LOW (ref 22–32)
Calcium: 6.8 mg/dL — ABNORMAL LOW (ref 8.9–10.3)
Chloride: 92 mmol/L — ABNORMAL LOW (ref 98–111)
Creatinine, Ser: 2.75 mg/dL — ABNORMAL HIGH (ref 0.44–1.00)
GFR, Estimated: 18 mL/min — ABNORMAL LOW (ref >60)
Glucose, Bld: 559 mg/dL (ref 70–99)
Potassium: 3.3 mmol/L — ABNORMAL LOW (ref 3.5–5.1)
Sodium: 121 mmol/L — ABNORMAL LOW (ref 135–145)
Total Bilirubin: 0.2 mg/dL — ABNORMAL LOW (ref 0.3–1.2)
Total Protein: 4.7 g/dL — ABNORMAL LOW (ref 6.5–8.1)

## 2021-12-04 LAB — RESP PANEL BY RT-PCR (FLU A&B, COVID) ARPGX2
Influenza A by PCR: NEGATIVE
Influenza B by PCR: NEGATIVE
SARS Coronavirus 2 by RT PCR: NEGATIVE

## 2021-12-04 LAB — PROTIME-INR
INR: 1.2 (ref 0.8–1.2)
INR: 1.3 — ABNORMAL HIGH (ref 0.8–1.2)
Prothrombin Time: 15.2 seconds (ref 11.4–15.2)
Prothrombin Time: 15.9 seconds — ABNORMAL HIGH (ref 11.4–15.2)

## 2021-12-04 LAB — PREPARE RBC (CROSSMATCH)

## 2021-12-04 LAB — ETHANOL: Alcohol, Ethyl (B): <10 mg/dL (ref <10)

## 2021-12-04 SURGERY — LAPAROTOMY, EXPLORATORY
Anesthesia: General | Site: Ankle

## 2021-12-04 MED ORDER — INSULIN ASPART 100 UNIT/ML IJ SOLN
0.0000 [IU] | INTRAMUSCULAR | Status: DC
  Administered 2021-12-05 – 2021-12-11 (×10): 3 [IU] via SUBCUTANEOUS
  Administered 2021-12-12: 7 [IU] via SUBCUTANEOUS
  Administered 2021-12-12: 3 [IU] via SUBCUTANEOUS

## 2021-12-04 MED ORDER — MIDAZOLAM HCL 2 MG/2ML IJ SOLN
INTRAMUSCULAR | Status: DC | PRN
Start: 2021-12-04 — End: 2021-12-04
  Administered 2021-12-04: 2 mg via INTRAVENOUS

## 2021-12-04 MED ORDER — PROPOFOL 500 MG/50ML IV EMUL
INTRAVENOUS | Status: DC | PRN
  Administered 2021-12-04: 50 ug/kg/min via INTRAVENOUS

## 2021-12-04 MED ORDER — PHENYLEPHRINE HCL-NACL 20-0.9 MG/250ML-% IV SOLN
INTRAVENOUS | Status: DC | PRN
  Administered 2021-12-04: 50 ug/min via INTRAVENOUS

## 2021-12-04 MED ORDER — FENTANYL CITRATE (PF) 250 MCG/5ML IJ SOLN
INTRAMUSCULAR | Status: DC | PRN
  Administered 2021-12-04: 100 ug via INTRAVENOUS
  Administered 2021-12-04 (×2): 50 ug via INTRAVENOUS

## 2021-12-04 MED ORDER — PHENYLEPHRINE 40 MCG/ML (10ML) SYRINGE FOR IV PUSH (FOR BLOOD PRESSURE SUPPORT)
PREFILLED_SYRINGE | INTRAVENOUS | Status: AC
  Filled 2021-12-04: qty 10

## 2021-12-04 MED ORDER — ROCURONIUM BROMIDE 10 MG/ML (PF) SYRINGE
PREFILLED_SYRINGE | INTRAVENOUS | Status: DC | PRN
Start: 2021-12-04 — End: 2021-12-04
  Administered 2021-12-04 (×2): 50 mg via INTRAVENOUS

## 2021-12-04 MED ORDER — HYDROMORPHONE HCL 1 MG/ML IJ SOLN
INTRAMUSCULAR | Status: AC
  Filled 2021-12-04: qty 1

## 2021-12-04 MED ORDER — PROPOFOL 1000 MG/100ML IV EMUL
INTRAVENOUS | Status: AC
  Filled 2021-12-04: qty 100

## 2021-12-04 MED ORDER — ACETAMINOPHEN 10 MG/ML IV SOLN
1000.0000 mg | Freq: Three times a day (TID) | INTRAVENOUS | Status: AC
  Administered 2021-12-04 – 2021-12-05 (×3): 1000 mg via INTRAVENOUS
  Filled 2021-12-04 (×3): qty 100

## 2021-12-04 MED ORDER — ONDANSETRON HCL 4 MG/2ML IJ SOLN
INTRAMUSCULAR | Status: AC
  Filled 2021-12-04: qty 2

## 2021-12-04 MED ORDER — SODIUM CHLORIDE 0.9 % IV SOLN: INTRAVENOUS | Status: DC

## 2021-12-04 MED ORDER — FENTANYL CITRATE PF 50 MCG/ML IJ SOSY
25.0000 ug | PREFILLED_SYRINGE | INTRAMUSCULAR | Status: DC | PRN
  Administered 2021-12-05 (×2): 25 ug via INTRAVENOUS
  Filled 2021-12-04 (×2): qty 1

## 2021-12-04 MED ORDER — DEXAMETHASONE SODIUM PHOSPHATE 10 MG/ML IJ SOLN
INTRAMUSCULAR | Status: AC
  Filled 2021-12-04: qty 1

## 2021-12-04 MED ORDER — HYDROMORPHONE HCL 1 MG/ML IJ SOLN
1.0000 mg | Freq: Once | INTRAMUSCULAR | Status: AC
  Administered 2021-12-04: 1 mg via INTRAVENOUS

## 2021-12-04 MED ORDER — CALCIUM GLUCONATE-NACL 2-0.675 GM/100ML-% IV SOLN
2.0000 g | Freq: Once | INTRAVENOUS | Status: AC
  Administered 2021-12-05: 2000 mg via INTRAVENOUS
  Filled 2021-12-04: qty 100

## 2021-12-04 MED ORDER — FENTANYL CITRATE (PF) 250 MCG/5ML IJ SOLN
INTRAMUSCULAR | Status: AC
  Filled 2021-12-04: qty 5

## 2021-12-04 MED ORDER — DEXAMETHASONE SODIUM PHOSPHATE 10 MG/ML IJ SOLN
INTRAMUSCULAR | Status: DC | PRN
Start: 2021-12-04 — End: 2021-12-04
  Administered 2021-12-04: 5 mg via INTRAVENOUS

## 2021-12-04 MED ORDER — SODIUM CHLORIDE 0.45 % IV SOLN: INTRAVENOUS | Status: DC

## 2021-12-04 MED ORDER — VASOPRESSIN 20 UNIT/ML IV SOLN
INTRAVENOUS | Status: AC
  Filled 2021-12-04: qty 1

## 2021-12-04 MED ORDER — DOCUSATE SODIUM 50 MG/5ML PO LIQD
100.0000 mg | Freq: Two times a day (BID) | ORAL | Status: DC
  Administered 2021-12-05 – 2021-12-06 (×4): 100 mg
  Filled 2021-12-04 (×4): qty 10

## 2021-12-04 MED ORDER — ORAL CARE MOUTH RINSE: 15.0000 mL | Freq: Once | OROMUCOSAL | Status: DC

## 2021-12-04 MED ORDER — PROPOFOL 10 MG/ML IV BOLUS
INTRAVENOUS | Status: DC | PRN
  Administered 2021-12-04: 80 mg via INTRAVENOUS

## 2021-12-04 MED ORDER — ROCURONIUM BROMIDE 10 MG/ML (PF) SYRINGE
PREFILLED_SYRINGE | INTRAVENOUS | Status: AC
  Filled 2021-12-04: qty 30

## 2021-12-04 MED ORDER — HYDROMORPHONE HCL 1 MG/ML IJ SOLN: 1.0000 mg | INTRAMUSCULAR | Status: DC | PRN

## 2021-12-04 MED ORDER — IOHEXOL 300 MG/ML  SOLN
100.0000 mL | Freq: Once | INTRAMUSCULAR | Status: AC | PRN
  Administered 2021-12-04: 100 mL via INTRAVENOUS

## 2021-12-04 MED ORDER — ONDANSETRON HCL 4 MG/2ML IJ SOLN
INTRAMUSCULAR | Status: DC | PRN
  Administered 2021-12-04: 4 mg via INTRAVENOUS

## 2021-12-04 MED ORDER — CEFAZOLIN SODIUM-DEXTROSE 2-4 GM/100ML-% IV SOLN
2.0000 g | Freq: Once | INTRAVENOUS | Status: AC
  Administered 2021-12-04: 2 g via INTRAVENOUS
  Filled 2021-12-04: qty 100

## 2021-12-04 MED ORDER — CEFAZOLIN SODIUM-DEXTROSE 2-3 GM-%(50ML) IV SOLR
INTRAVENOUS | Status: DC | PRN
  Administered 2021-12-04: 2 g via INTRAVENOUS

## 2021-12-04 MED ORDER — 0.9 % SODIUM CHLORIDE (POUR BTL) OPTIME
TOPICAL | Status: DC | PRN
  Administered 2021-12-04 (×3): 1000 mL

## 2021-12-04 MED ORDER — PROPOFOL 10 MG/ML IV BOLUS
INTRAVENOUS | Status: AC
  Filled 2021-12-04: qty 20

## 2021-12-04 MED ORDER — LIDOCAINE 2% (20 MG/ML) 5 ML SYRINGE
INTRAMUSCULAR | Status: DC | PRN
  Administered 2021-12-04: 80 mg via INTRAVENOUS

## 2021-12-04 MED ORDER — FENTANYL CITRATE PF 50 MCG/ML IJ SOSY
25.0000 ug | PREFILLED_SYRINGE | INTRAMUSCULAR | Status: DC | PRN
  Administered 2021-12-05 (×2): 50 ug via INTRAVENOUS
  Filled 2021-12-04 (×2): qty 1

## 2021-12-04 MED ORDER — SUCCINYLCHOLINE CHLORIDE 200 MG/10ML IV SOSY
PREFILLED_SYRINGE | INTRAVENOUS | Status: AC
  Filled 2021-12-04: qty 10

## 2021-12-04 MED ORDER — ARTIFICIAL TEARS OPHTHALMIC OINT
TOPICAL_OINTMENT | OPHTHALMIC | Status: AC
  Filled 2021-12-04: qty 3.5

## 2021-12-04 MED ORDER — ONDANSETRON 4 MG PO TBDP: 4.0000 mg | ORAL_TABLET | Freq: Four times a day (QID) | ORAL | Status: DC | PRN

## 2021-12-04 MED ORDER — PROPOFOL 1000 MG/100ML IV EMUL
0.0000 ug/kg/min | INTRAVENOUS | Status: DC
  Administered 2021-12-04: 50 ug/kg/min via INTRAVENOUS
  Administered 2021-12-05: 40 ug/kg/min via INTRAVENOUS
  Administered 2021-12-05: 30 ug/kg/min via INTRAVENOUS
  Administered 2021-12-05: 8 ug/kg/min via INTRAVENOUS
  Administered 2021-12-05: 30 ug/kg/min via INTRAVENOUS
  Administered 2021-12-06: 40 ug/kg/min via INTRAVENOUS
  Administered 2021-12-06: 30 ug/kg/min via INTRAVENOUS
  Administered 2021-12-06: 40 ug/kg/min via INTRAVENOUS
  Filled 2021-12-04 (×7): qty 100

## 2021-12-04 MED ORDER — SUCCINYLCHOLINE CHLORIDE 200 MG/10ML IV SOSY
PREFILLED_SYRINGE | INTRAVENOUS | Status: DC | PRN
Start: 2021-12-04 — End: 2021-12-04
  Administered 2021-12-04: 100 mg via INTRAVENOUS

## 2021-12-04 MED ORDER — SODIUM CHLORIDE (PF) 0.9 % IJ SOLN
INTRAMUSCULAR | Status: AC
  Filled 2021-12-04: qty 10

## 2021-12-04 MED ORDER — ONDANSETRON HCL 4 MG/2ML IJ SOLN: 4.0000 mg | Freq: Four times a day (QID) | INTRAMUSCULAR | Status: DC | PRN

## 2021-12-04 MED ORDER — PHENYLEPHRINE 40 MCG/ML (10ML) SYRINGE FOR IV PUSH (FOR BLOOD PRESSURE SUPPORT)
PREFILLED_SYRINGE | INTRAVENOUS | Status: DC | PRN
Start: 2021-12-04 — End: 2021-12-04
  Administered 2021-12-04: 80 ug via INTRAVENOUS
  Administered 2021-12-04: 120 ug via INTRAVENOUS

## 2021-12-04 MED ORDER — LACTATED RINGERS IV SOLN: INTRAVENOUS | Status: DC | PRN

## 2021-12-04 MED ORDER — ALBUMIN HUMAN 5 % IV SOLN
INTRAVENOUS | Status: DC | PRN
Start: 2021-12-04 — End: 2021-12-04

## 2021-12-04 MED ORDER — CHLORHEXIDINE GLUCONATE 0.12 % MT SOLN: 15.0000 mL | Freq: Once | OROMUCOSAL | Status: DC

## 2021-12-04 MED ORDER — MIDAZOLAM HCL 2 MG/2ML IJ SOLN
INTRAMUSCULAR | Status: AC
  Filled 2021-12-04: qty 2

## 2021-12-04 MED ORDER — TETANUS-DIPHTH-ACELL PERTUSSIS 5-2.5-18.5 LF-MCG/0.5 IM SUSY
0.5000 mL | PREFILLED_SYRINGE | Freq: Once | INTRAMUSCULAR | Status: AC
  Administered 2021-12-04: 0.5 mL via INTRAMUSCULAR
  Filled 2021-12-04: qty 0.5

## 2021-12-04 SURGICAL SUPPLY — 71 items
ADH SKN CLS APL DERMABOND .7 (GAUZE/BANDAGES/DRESSINGS) ×2
APL PRP STRL LF DISP 70% ISPRP (MISCELLANEOUS) ×2
BAG COUNTER SPONGE SURGICOUNT (BAG) ×9 IMPLANT
BAG SPNG CNTER NS LX DISP (BAG) ×12
BLADE CLIPPER SURG (BLADE) IMPLANT
BNDG CMPR MED 15X6 ELC VLCR LF (GAUZE/BANDAGES/DRESSINGS) ×2
BNDG ELASTIC 6X15 VLCR STRL LF (GAUZE/BANDAGES/DRESSINGS) ×1 IMPLANT
BNDG GAUZE ELAST 4 BULKY (GAUZE/BANDAGES/DRESSINGS) ×1 IMPLANT
CANISTER SUCT 3000ML PPV (MISCELLANEOUS) ×4 IMPLANT
CHLORAPREP W/TINT 26 (MISCELLANEOUS) ×4 IMPLANT
CNTNR URN SCR LID CUP LEK RST (MISCELLANEOUS) IMPLANT
CONT SPEC 4OZ STRL OR WHT (MISCELLANEOUS) ×6
COVER SURGICAL LIGHT HANDLE (MISCELLANEOUS) ×4 IMPLANT
DERMABOND ADVANCED (GAUZE/BANDAGES/DRESSINGS) ×1
DERMABOND ADVANCED .7 DNX12 (GAUZE/BANDAGES/DRESSINGS) ×6 IMPLANT
DRAPE INCISE IOBAN 66X45 STRL (DRAPES) ×4 IMPLANT
DRAPE LAPAROSCOPIC ABDOMINAL (DRAPES) ×4 IMPLANT
DRAPE WARM FLUID 44X44 (DRAPES) ×4 IMPLANT
DRSG OPSITE POSTOP 4X10 (GAUZE/BANDAGES/DRESSINGS) ×1 IMPLANT
DRSG OPSITE POSTOP 4X12 (GAUZE/BANDAGES/DRESSINGS) ×1 IMPLANT
DRSG OPSITE POSTOP 4X8 (GAUZE/BANDAGES/DRESSINGS) IMPLANT
DRSG PAD ABDOMINAL 8X10 ST (GAUZE/BANDAGES/DRESSINGS) ×1 IMPLANT
ELECT BLADE 4.0 EZ CLEAN MEGAD (MISCELLANEOUS) ×3
ELECT BLADE 6.5 EXT (BLADE) IMPLANT
ELECT CAUTERY BLADE 6.4 (BLADE) ×4 IMPLANT
ELECT REM PT RETURN 9FT ADLT (ELECTROSURGICAL) ×3
ELECTRODE BLDE 4.0 EZ CLN MEGD (MISCELLANEOUS) IMPLANT
ELECTRODE REM PT RTRN 9FT ADLT (ELECTROSURGICAL) ×3 IMPLANT
GAUZE SPONGE 4X4 12PLY STRL (GAUZE/BANDAGES/DRESSINGS) ×1 IMPLANT
GAUZE XEROFORM 5X9 LF (GAUZE/BANDAGES/DRESSINGS) ×1 IMPLANT
GLOVE SURG POLY MICRO LF SZ5.5 (GLOVE) ×4 IMPLANT
GLOVE SURG UNDER POLY LF SZ6 (GLOVE) ×4 IMPLANT
GOWN STRL REUS W/ TWL LRG LVL3 (GOWN DISPOSABLE) ×6 IMPLANT
GOWN STRL REUS W/TWL LRG LVL3 (GOWN DISPOSABLE) ×6
HANDLE SUCTION POOLE (INSTRUMENTS) ×3 IMPLANT
HEMOSTAT SNOW SURGICEL 2X4 (HEMOSTASIS) ×2 IMPLANT
KIT BASIN OR (CUSTOM PROCEDURE TRAY) ×4 IMPLANT
KIT TURNOVER KIT B (KITS) ×4 IMPLANT
LIGASURE IMPACT 36 18CM CVD LR (INSTRUMENTS) IMPLANT
NS IRRIG 1000ML POUR BTL (IV SOLUTION) ×8 IMPLANT
PACK GENERAL/GYN (CUSTOM PROCEDURE TRAY) ×4 IMPLANT
PAD ARMBOARD 7.5X6 YLW CONV (MISCELLANEOUS) ×4 IMPLANT
PAD CAST 4YDX4 CTTN HI CHSV (CAST SUPPLIES) IMPLANT
PADDING CAST COTTON 4X4 STRL (CAST SUPPLIES) ×3
PADDING CAST COTTON 6X4 STRL (CAST SUPPLIES) ×1 IMPLANT
PENCIL SMOKE EVACUATOR (MISCELLANEOUS) ×4 IMPLANT
RELOAD PROXIMATE 75MM BLUE (ENDOMECHANICALS) ×9 IMPLANT
RELOAD STAPLE 75 3.8 BLU REG (ENDOMECHANICALS) IMPLANT
SEALANT PATCH FIBRIN 2X4IN (MISCELLANEOUS) ×1 IMPLANT
SLEEVE SUCTION CATH 165 (SLEEVE) ×4 IMPLANT
SPECIMEN JAR LARGE (MISCELLANEOUS) IMPLANT
SPONGE T-LAP 18X18 ~~LOC~~+RFID (SPONGE) ×10 IMPLANT
STAPLER GUN LINEAR PROX 60 (STAPLE) ×1 IMPLANT
STAPLER PROXIMATE 75MM BLUE (STAPLE) ×1 IMPLANT
STAPLER VISISTAT 35W (STAPLE) ×1 IMPLANT
SUCTION POOLE HANDLE (INSTRUMENTS) ×3
SUT MNCRL AB 4-0 PS2 18 (SUTURE) ×6 IMPLANT
SUT PDS AB 1 TP1 96 (SUTURE) ×8 IMPLANT
SUT PROLENE 4 0 RB 1 (SUTURE) ×3
SUT PROLENE 4-0 RB1 18X2 ARM (SUTURE) IMPLANT
SUT SILK 0 TIES 10X30 (SUTURE) ×1 IMPLANT
SUT SILK 2 0 SH CR/8 (SUTURE) ×4 IMPLANT
SUT SILK 2 0 TIES 10X30 (SUTURE) ×4 IMPLANT
SUT SILK 3 0 SH CR/8 (SUTURE) ×5 IMPLANT
SUT SILK 3 0 TIES 10X30 (SUTURE) ×4 IMPLANT
SUT VIC AB 3-0 SH 18 (SUTURE) IMPLANT
SUT VIC AB 3-0 SH 27 (SUTURE)
SUT VIC AB 3-0 SH 27XBRD (SUTURE) ×6 IMPLANT
TOWEL GREEN STERILE (TOWEL DISPOSABLE) ×4 IMPLANT
TRAY FOLEY MTR SLVR 16FR STAT (SET/KITS/TRAYS/PACK) IMPLANT
YANKAUER SUCT BULB TIP NO VENT (SUCTIONS) IMPLANT

## 2021-12-04 NOTE — Brief Op Note (Signed)
12/04/2021  10:20 PM  PATIENT:  Satomi A Song  66 y.o. female  PRE-OPERATIVE DIAGNOSIS:  splenectomy, possible small bowel resection, left ankle fracture dislocation  POST-OPERATIVE DIAGNOSIS:  splenectomy, possible small bowel resection, left ankle fracture dislocation  PROCEDURE:   Closed reduction and splinting of left ankle fracture dislocation Irrigation and debridement of left distal leg wound with primary closure  SURGEON:  Surgeon(s) and Role: Panel 1:    Dwan Bolt, MD - Primary    * Jesusita Oka, MD - Assisting Panel 2:    * Armond Hang, MD - Primary  PHYSICIAN ASSISTANT: None  ASSISTANTS: none   ANESTHESIA:   general  EBL:  Minimal (left ankle)  BLOOD ADMINISTERED: refer anesthesia report  DRAINS: none to left ankle  LOCAL MEDICATIONS USED:  NONE to left ankle  SPECIMEN:  No Specimen from left ankle  DISPOSITION OF SPECIMEN:  N/A  COUNTS:  YES  TOURNIQUET:  None used  DICTATION: .Note written in EPIC  PLAN OF CARE:  ICU postop  PATIENT DISPOSITION:  ICU - intubated and critically ill.   Delay start of Pharmacological VTE agent (>24hrs) due to surgical blood loss or risk of bleeding: not applicable
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
## 2021-12-04 NOTE — Consult Note (Signed)
Patient ID: [name] MRN: [id number] DOB/AGE: 07-02-1956 66 y.o.  Admit date: 12/04/2021  Admission Diagnoses:  Principal Problem:   Multisystem blunt trauma   HPI: Ortho consulted by Trauma team for left ankle fracture dislocation in this patient who presented as level 1 trauma on 12/04/21. Trauma bay spot imaging showed left ankle dislocation with displaced distal fibula fracture. Ortho evaluation of the patient was done in the OR where she is currently undergoing exploratory laparotomy by Dr. Zenia Resides. Following the ex lap, we were prepared for left ankle closed vs open reduction, possible external fixation and/or internal fixation, possible splinting with delayed definitive fixation, possible irrigation and debridement of the left leg if there is an open injury. This plan was reviewed with the patient's sister who expressed agreement and signed an updated consent form. She is going to notify the patient's sons and other family members about this and other aspects of the patient's injury/care.  Past Medical History: Past Medical History:  Diagnosis Date   Hyperlipidemia    Hypertension    Peripheral neuropathy    Renal cell cancer (HCC)    TIA (transient ischemic attack)     Surgical History: Past Surgical History:  Procedure Laterality Date   CESAREAN SECTION     CHOLECYSTECTOMY, LAPAROSCOPIC  07/21/2019   NEPHRECTOMY Left 07/18/2015   TUBAL LIGATION      Family History: Family History  Problem Relation Age of Onset   Hyperlipidemia Mother    Gout Mother     Social History: Social History   Socioeconomic History   Marital status: Single    Spouse name: Not on file   Number of children: Not on file   Years of education: Not on file   Highest education level: Not on file  Occupational History   Not on file  Tobacco Use   Smoking status: Never   Smokeless tobacco: Never  Substance and Sexual Activity   Alcohol use: Never   Drug use: Not on file   Sexual  activity: Not on file  Other Topics Concern   Not on file  Social History Narrative   Not on file   Social Determinants of Health   Financial Resource Strain: Not on file  Food Insecurity: Not on file  Transportation Needs: Not on file  Physical Activity: Not on file  Stress: Not on file  Social Connections: Not on file  Intimate Partner Violence: Not on file    Allergies: Patient has no allergy information on record.  Medications: I have reviewed the patient's current medications.  Vital Signs: Patient Vitals for the past 24 hrs:  BP Temp Temp src Pulse Resp SpO2 Height Weight  12/04/21 2222 -- -- -- 70 20 100 % -- --  12/04/21 1909 100/66 (!) 96.5 F (35.8 C) Axillary 81 19 100 % -- --  12/04/21 1859 93/61 (!) 96.5 F (35.8 C) Axillary -- -- 98 % -- --  12/04/21 1858 -- -- -- -- -- -- 5\' 3"  (1.6 m) 97.5 kg  12/04/21 1843 (!) 89/58 -- -- -- -- -- -- --  12/04/21 1830 -- (!) 96.3 F (35.7 C) -- -- -- -- -- --  12/04/21 1815 103/64 -- -- -- -- -- -- --  12/04/21 1800 (!) 103/52 -- -- -- -- -- -- --  12/04/21 1745 (!) 97/59 (!) 97 F (36.1 C) Axillary -- -- -- -- --  12/04/21 1730 (!) 92/58 -- -- -- -- -- -- --  12/04/21 1715  90/60 -- -- -- -- -- -- --  12/04/21 1709 (!) 91/55 -- -- -- -- -- -- --  12/04/21 1708 -- (!) 97.5 F (36.4 C) Temporal -- 19 98 % 5\' 3"  (1.6 m) 97.5 kg    Radiology: CT HEAD WO CONTRAST  Addendum Date: 12/04/2021   ADDENDUM REPORT: 12/04/2021 19:11 ADDENDUM: Additional findings of colonic thickening and styloid process fractures were communicated as outlined below. These results were called by telephone at the time of interpretation on 12/04/2021 at 7:10 pm to provider Michaelle Birks, who verbally acknowledged these results. Electronically Signed   By: Zetta Bills M.D.   On: 12/04/2021 19:11   Result Date: 12/04/2021 CLINICAL DATA:  History of blunt trauma in a 66 year old female. EXAM: CT HEAD WITHOUT CONTRAST CT CERVICAL SPINE WITHOUT CONTRAST  CT CHEST, ABDOMEN AND PELVIS WITH CONTRAST TECHNIQUE: Contiguous axial images were obtained from the base of the skull through the vertex without intravenous contrast. Multidetector CT imaging of the cervical spine was performed without intravenous contrast. Multiplanar CT image reconstructions were also generated. Multidetector CT imaging of the chest, abdomen and pelvis was performed following the standard protocol during bolus administration of intravenous contrast. RADIATION DOSE REDUCTION: This exam was performed according to the departmental dose-optimization program which includes automated exposure control, adjustment of the mA and/or kV according to patient size and/or use of iterative reconstruction technique. CONTRAST:  100 mL Omnipaque 300 COMPARISON:  Comparison chest CT and abdominal CTs dating back to 2008 FINDINGS: CT HEAD FINDINGS Brain: Small parafalcine subdural hematoma best seen on image (25/3), no additional signs of intracranial hemorrhage. No mass-effect, midline shift or hydrocephalus. Vascular: No hyperdense vessel or unexpected calcification. Skull: No calvarial abnormality but there are fractures of the bilateral styloid processes in this patient with known fracture of the cervical spine. Sinuses/Orbits: No acute finding. Other: None CT CERVICAL FINDINGS Alignment: Abnormal alignment at C2 with distraction of posterior elements in the setting of hangman's fracture. Widening of the C2-3 disc space. Alignment throughout the remainder of the cervical spine is unremarkable. Skull base and vertebrae: Hangman's fracture at C2 with fracture through bilateral pedicles of C2 and with distraction of posterior elements and angulation, moderate angulation at the fracture site. Fracture extends into the posterior aspect of the vertebral body on the LEFT passing obliquely through the posterior vertebral body and into the transverse foramen where there is comminution extending into the transverse  foramen with distraction of fracture fragments associated with fractures of the LEFT transverse foramen. On the RIGHT fracture extends into the posterior and inferior margin of the transverse foramen. C1 with normal relationship with occipital condyles. No extension of fracture into the a Don toilet. No additional fracture of the cervical spine. Soft tissues and spinal canal: Added density posterior to C2 may represent small amount of hematoma in the central canal, difficult to assess. Retropharyngeal course of the carotid arteries. Disc levels: Multilevel degenerative changes throughout the cervical spine greatest at C6-7 and C7-T1. Other:  None. CT CHEST FINDINGS Cardiovascular: Normal caliber of the thoracic aorta. No stranding adjacent to the thoracic aorta. No mediastinal hematoma. Heart size moderately enlarged with signs of coronary artery calcification. No pericardial effusion. Central pulmonary vessels are unremarkable. Mediastinum/Nodes: Mildly patulous appearance of the esophagus. No pneumomediastinum or signs of mediastinal hematoma. No thoracic inlet, axillary or mediastinal lymphadenopathy. Lungs/Pleura: No pneumothorax. Multiple RIGHT-sided rib fractures. No consolidation. Patchy ground-glass attenuation more likely related to atelectasis with mild areas of contusion suspected as well. Musculoskeletal: Mildly  displaced rib fractures of ribs 2 and 4. Mildly displaced short-segment segmental fracture of the RIGHT fifth and sixth ribs. Mildly displaced fracture of the RIGHT seventh rib laterally and eighth rib as well. Visualized clavicles and scapulae are intact as is the sternum. No displaced rib fractures on the LEFT. CT ABDOMEN PELVIS FINDINGS Hepatobiliary: Perihepatic fluid higher density compatible with hemoperitoneum. No visible hepatic laceration. Mildly irregular area over the dome of the RIGHT hemi liver shows more water density and there are scattered hepatic cysts of similar density  throughout the liver. This area in hepatic subsegment VIII shows mildly irregular margins and is contiguous with a small cyst. No surrounding stranding in this area. Numerous cysts present on previous imaging in the RIGHT hepatic lobe though not as large as this area on the current study. Portal vein is patent. Post cholecystectomy. No biliary duct dilation. Pancreas: No signs of pancreatic laceration or peripancreatic stranding. 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 There is a subtle indistinct area of hypoattenuation along the inferior margin (image 64/3) areas of low attenuation along the periphery of the spleen in some areas appear to represent splenic clefts. Another area that is mildly irregular on image 78 of series 6 may also represent a small laceration. 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 Adrenals/Urinary Tract: Post LEFT nephrectomy and adrenalectomy. RIGHT adrenal is normal. Marked RIGHT renal cortical scarring. No hydronephrosis. Urinary bladder is unremarkable. Stomach/Bowel: Gastric distension.  No perigastric stranding. Small bowel with preserved enhancement. Distal bowel particularly distal ileum without visible enhancement. Signs of mesenteric hematoma in the RIGHT lower quadrant and signs of mesenteric stranding in the mid abdomen. Suspected extravasation of contrast into a sentinel clot in the pelvis. Clot tracks along the upper pelvis and lower abdomen. Less dense fluid is seen adjacent to the spleen and liver. No current evidence of pneumatosis. No free air. Focal colonic thickening at the splenic flexure with adjacent nodularity and small lymph nodes (image 53/3 and image 51/3. There is some adjacent stranding as well. Vascular/Lymphatic: Suspect subtle areas of extravasation in the RIGHT lower quadrant arising from injured small bowel mesentery in  the setting of mesenteric injury. The abdominal aorta is normal caliber. Aortic atherosclerosis without aneurysm. There is no gastrohepatic or hepatoduodenal ligament lymphadenopathy. No retroperitoneal or mesenteric lymphadenopathy. No pelvic sidewall lymphadenopathy. Reproductive: Not well evaluated, hematoma surrounds the uterus. No adnexal mass. Other: No free air. Moderate volume hemoperitoneum with sinal clot in the pelvis. Musculoskeletal: Rib fractures as outlined above. Bony pelvis is intact. Spinal degenerative changes without signs of acute injury to the thoracolumbar spine. Body wall contusion along the RIGHT and LEFT flank IMPRESSION: CT of the head: Small parafalcine subdural hematoma. No signs of midline shift or mass effect at this time. Signs of bilateral styloid process fractures in the setting of cervical spine injury. CT of the cervical spine: Unstable fracture at C2 compatible with hangman's fracture with moderate angulation of posterior elements and with extension into bilateral neural foramina, potentially associated with small amount of hematoma in the central canal. CT angiography may be helpful for further assessment of vertebral arteries. Degenerative changes elsewhere in the cervical spine CT of the chest, abdomen and pelvis: Hemoperitoneum in the setting of mesenteric hematoma in mesenteric injury in the RIGHT lower quadrant and signs of subtle areas of extravasation in the RIGHT lower quadrant arising from injured small bowel  mesentery in the setting of mesenteric injury. Signs of hypoperfused bowel/bowel with developing ischemia in the RIGHT lower quadrant. Site of mesenteric injury in the ileal mesentery also likely proximal jejunal mesenteric injury as well. 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 There is perisplenic hemoperitoneum but area of denser blood in the pelvis favors mesenteric source. Irregular low-attenuation area in the  dome of the RIGHT hemi liver could represent an area of contusion or laceration superimposed on pre-existing cysts in this area. Consider attention on follow-up. Multiple RIGHT-sided rib fractures, without pneumothorax. Area of thickening at the splenic flexure with adjacent nodularity and small lymph nodes. Findings raise the question of colon cancer. In the setting of trauma colonic thickening could also represent traumatic injury to the splenic flexure. Aortic Atherosclerosis (ICD10-I70.0). Above findings were relayed directly to the surgeon, Dr. Zenia Resides, caring for the patient regarding findings in the mesentery and in the cervical spine as well as equivocal findings in the abdomen related to the liver and spleen. This conversation occurred at approximately 1755 hours on 12/04/2021. Findings of suspected parafalcine subdural hematoma were related via telephone at the time of dictation of the report at approximately 1815 hours. Electronically Signed: By: Zetta Bills M.D. On: 12/04/2021 19:01   CT CERVICAL SPINE WO CONTRAST  Addendum Date: 12/04/2021   ADDENDUM REPORT: 12/04/2021 19:11 ADDENDUM: Additional findings of colonic thickening and styloid process fractures were communicated as outlined below. These results were called by telephone at the time of interpretation on 12/04/2021 at 7:10 pm to provider Michaelle Birks, who verbally acknowledged these results. Electronically Signed   By: Zetta Bills M.D.   On: 12/04/2021 19:11   Result Date: 12/04/2021 CLINICAL DATA:  History of blunt trauma in a 66 year old female. EXAM: CT HEAD WITHOUT CONTRAST CT CERVICAL SPINE WITHOUT CONTRAST CT CHEST, ABDOMEN AND PELVIS WITH CONTRAST TECHNIQUE: Contiguous axial images were obtained from the base of the skull through the vertex without intravenous contrast. Multidetector CT imaging of the cervical spine was performed without intravenous contrast. Multiplanar CT image reconstructions were also generated. Multidetector CT  imaging of the chest, abdomen and pelvis was performed following the standard protocol during bolus administration of intravenous contrast. RADIATION DOSE REDUCTION: This exam was performed according to the departmental dose-optimization program which includes automated exposure control, adjustment of the mA and/or kV according to patient size and/or use of iterative reconstruction technique. CONTRAST:  100 mL Omnipaque 300 COMPARISON:  Comparison chest CT and abdominal CTs dating back to 2008 FINDINGS: CT HEAD FINDINGS Brain: Small parafalcine subdural hematoma best seen on image (25/3), no additional signs of intracranial hemorrhage. No mass-effect, midline shift or hydrocephalus. Vascular: No hyperdense vessel or unexpected calcification. Skull: No calvarial abnormality but there are fractures of the bilateral styloid processes in this patient with known fracture of the cervical spine. Sinuses/Orbits: No acute finding. Other: None CT CERVICAL FINDINGS Alignment: Abnormal alignment at C2 with distraction of posterior elements in the setting of hangman's fracture. Widening of the C2-3 disc space. Alignment throughout the remainder of the cervical spine is unremarkable. Skull base and vertebrae: Hangman's fracture at C2 with fracture through bilateral pedicles of C2 and with distraction of posterior elements and angulation, moderate angulation at the fracture site. Fracture extends into the posterior aspect of the vertebral body on the LEFT passing obliquely through the posterior vertebral body and into the transverse foramen where there is comminution extending into the transverse foramen with distraction of fracture fragments  associated with fractures of the LEFT transverse foramen. On the RIGHT fracture extends into the posterior and inferior margin of the transverse foramen. C1 with normal relationship with occipital condyles. No extension of fracture into the a Don toilet. No additional fracture of the cervical  spine. Soft tissues and spinal canal: Added density posterior to C2 may represent small amount of hematoma in the central canal, difficult to assess. Retropharyngeal course of the carotid arteries. Disc levels: Multilevel degenerative changes throughout the cervical spine greatest at C6-7 and C7-T1. Other:  None. CT CHEST FINDINGS Cardiovascular: Normal caliber of the thoracic aorta. No stranding adjacent to the thoracic aorta. No mediastinal hematoma. Heart size moderately enlarged with signs of coronary artery calcification. No pericardial effusion. Central pulmonary vessels are unremarkable. Mediastinum/Nodes: Mildly patulous appearance of the esophagus. No pneumomediastinum or signs of mediastinal hematoma. No thoracic inlet, axillary or mediastinal lymphadenopathy. Lungs/Pleura: No pneumothorax. Multiple RIGHT-sided rib fractures. No consolidation. Patchy ground-glass attenuation more likely related to atelectasis with mild areas of contusion suspected as well. Musculoskeletal: Mildly displaced rib fractures of ribs 2 and 4. Mildly displaced short-segment segmental fracture of the RIGHT fifth and sixth ribs. Mildly displaced fracture of the RIGHT seventh rib laterally and eighth rib as well. Visualized clavicles and scapulae are intact as is the sternum. No displaced rib fractures on the LEFT. CT ABDOMEN PELVIS FINDINGS Hepatobiliary: Perihepatic fluid higher density compatible with hemoperitoneum. No visible hepatic laceration. Mildly irregular area over the dome of the RIGHT hemi liver shows more water density and there are scattered hepatic cysts of similar density throughout the liver. This area in hepatic subsegment VIII shows mildly irregular margins and is contiguous with a small cyst. No surrounding stranding in this area. Numerous cysts present on previous imaging in the RIGHT hepatic lobe though not as large as this area on the current study. Portal vein is patent. Post cholecystectomy. No biliary  duct dilation. Pancreas: No signs of pancreatic laceration or peripancreatic stranding. 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 There is a subtle indistinct area of hypoattenuation along the inferior margin (image 64/3) areas of low attenuation along the periphery of the spleen in some areas appear to represent splenic clefts. Another area that is mildly irregular on image 78 of series 6 may also represent a small laceration. 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 Adrenals/Urinary Tract: Post LEFT nephrectomy and adrenalectomy. RIGHT adrenal is normal. Marked RIGHT renal cortical scarring. No hydronephrosis. Urinary bladder is unremarkable. Stomach/Bowel: Gastric distension.  No perigastric stranding. Small bowel with preserved enhancement. Distal bowel particularly distal ileum without visible enhancement. Signs of mesenteric hematoma in the RIGHT lower quadrant and signs of mesenteric stranding in the mid abdomen. Suspected extravasation of contrast into a sentinel clot in the pelvis. Clot tracks along the upper pelvis and lower abdomen. Less dense fluid is seen adjacent to the spleen and liver. No current evidence of pneumatosis. No free air. Focal colonic thickening at the splenic flexure with adjacent nodularity and small lymph nodes (image 53/3 and image 51/3. There is some adjacent stranding as well. Vascular/Lymphatic: Suspect subtle areas of extravasation in the RIGHT lower quadrant arising from injured small bowel mesentery in the setting of mesenteric injury. The abdominal aorta is normal caliber. Aortic atherosclerosis without aneurysm. There is no gastrohepatic or hepatoduodenal ligament lymphadenopathy. No retroperitoneal or mesenteric lymphadenopathy. No pelvic sidewall lymphadenopathy. Reproductive: Not well evaluated,  hematoma surrounds the uterus. No adnexal  mass. Other: No free air. Moderate volume hemoperitoneum with sinal clot in the pelvis. Musculoskeletal: Rib fractures as outlined above. Bony pelvis is intact. Spinal degenerative changes without signs of acute injury to the thoracolumbar spine. Body wall contusion along the RIGHT and LEFT flank IMPRESSION: CT of the head: Small parafalcine subdural hematoma. No signs of midline shift or mass effect at this time. Signs of bilateral styloid process fractures in the setting of cervical spine injury. CT of the cervical spine: Unstable fracture at C2 compatible with hangman's fracture with moderate angulation of posterior elements and with extension into bilateral neural foramina, potentially associated with small amount of hematoma in the central canal. CT angiography may be helpful for further assessment of vertebral arteries. Degenerative changes elsewhere in the cervical spine CT of the chest, abdomen and pelvis: Hemoperitoneum in the setting of mesenteric hematoma in mesenteric injury in the RIGHT lower quadrant and signs of subtle areas of extravasation in the RIGHT lower quadrant arising from injured small bowel mesentery in the setting of mesenteric injury. Signs of hypoperfused bowel/bowel with developing ischemia in the RIGHT lower quadrant. Site of mesenteric injury in the ileal mesentery also likely proximal jejunal mesenteric injury as well. 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 There is perisplenic hemoperitoneum but area of denser blood in the pelvis favors mesenteric source. Irregular low-attenuation area in the dome of the RIGHT hemi liver could represent an area of contusion or laceration superimposed on pre-existing cysts in this area. Consider attention on follow-up. Multiple RIGHT-sided rib fractures, without pneumothorax. Area of thickening at the splenic flexure with adjacent nodularity and small lymph nodes. Findings raise the question of  colon cancer. In the setting of trauma colonic thickening could also represent traumatic injury to the splenic flexure. Aortic Atherosclerosis (ICD10-I70.0). Above findings were relayed directly to the surgeon, Dr. Zenia Resides, caring for the patient regarding findings in the mesentery and in the cervical spine as well as equivocal findings in the abdomen related to the liver and spleen. This conversation occurred at approximately 1755 hours on 12/04/2021. Findings of suspected parafalcine subdural hematoma were related via telephone at the time of dictation of the report at approximately 1815 hours. Electronically Signed: By: Zetta Bills M.D. On: 12/04/2021 19:01   DG Pelvis Portable  Result Date: 12/04/2021 CLINICAL DATA:  Trauma. EXAM: PORTABLE PELVIS 1-2 VIEWS COMPARISON:  None. FINDINGS: Examination is limited secondary to technique. There is no evidence of pelvic fracture or diastasis. No pelvic bone lesions are seen. IMPRESSION: Limited evaluation.  No acute fracture or dislocation identified. Electronically Signed   By: Ronney Asters M.D.   On: 12/04/2021 17:28   CT CHEST ABDOMEN PELVIS W CONTRAST  Addendum Date: 12/04/2021   ADDENDUM REPORT: 12/04/2021 19:11 ADDENDUM: Additional findings of colonic thickening and styloid process fractures were communicated as outlined below. These results were called by telephone at the time of interpretation on 12/04/2021 at 7:10 pm to provider Michaelle Birks, who verbally acknowledged these results. Electronically Signed   By: Zetta Bills M.D.   On: 12/04/2021 19:11   Result Date: 12/04/2021 CLINICAL DATA:  History of blunt trauma in a 66 year old female. EXAM: CT HEAD WITHOUT CONTRAST CT CERVICAL SPINE WITHOUT CONTRAST CT CHEST, ABDOMEN AND PELVIS WITH CONTRAST TECHNIQUE: Contiguous axial images were obtained from the base of the skull through the vertex without intravenous contrast. Multidetector CT imaging of the cervical spine was performed without intravenous  contrast. Multiplanar CT image reconstructions were also generated. Multidetector CT imaging of the chest, abdomen and pelvis was performed following the standard protocol during bolus administration of intravenous contrast. RADIATION DOSE REDUCTION: This exam was performed according to the departmental dose-optimization program which includes automated exposure control, adjustment of the mA and/or kV according to patient size and/or use of iterative reconstruction technique. CONTRAST:  100 mL Omnipaque 300 COMPARISON:  Comparison chest CT and abdominal CTs dating back to 2008 FINDINGS: CT HEAD FINDINGS Brain: Small parafalcine subdural hematoma best seen on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

## 2021-12-04 NOTE — Anesthesia Preprocedure Evaluation (Addendum)
Anesthesia Evaluation    Reviewed: Allergy & Precautions, Patient's Chart, lab work & pertinent test results, Unable to perform ROS - Chart review only  Airway Mallampati: III  TM Distance: >3 FB Neck ROM: Limited   Comment: c-collar in place Dental  (+) Edentulous Upper, Edentulous Lower, Dental Advisory Given   Pulmonary neg pulmonary ROS,    Pulmonary exam normal breath sounds clear to auscultation       Cardiovascular hypertension, Pt. on medications Normal cardiovascular exam Rhythm:Regular Rate:Normal  Echo 05/2019 1. The right ventricle has normal systolc function. The cavity was normal. There is no increase in right ventricular wall thickness.  2. Left atrial size was not assessed.  3. No evidence of mitral valve stenosis.  4. No stenosis of the aortic valve.  5. The aortic root and ascending aorta are normal in size and structure.  6. The interatrial septum was not assessed.  7. The left ventricle has normal systolic function, with an ejection fraction of 60-65%.    Neuro/Psych negative neurological ROS     GI/Hepatic negative GI ROS, Neg liver ROS,   Endo/Other  negative endocrine ROS  Renal/GU Renal disease     Musculoskeletal negative musculoskeletal ROS (+)   Abdominal (+) + obese,   Peds  Hematology negative hematology ROS (+)   Anesthesia Other Findings   Reproductive/Obstetrics                          Anesthesia Physical Anesthesia Plan  ASA: 3 and emergent  Anesthesia Plan: General   Post-op Pain Management:    Induction: Intravenous  PONV Risk Score and Plan: 4 or greater and Ondansetron, Dexamethasone and Treatment may vary due to age or medical condition  Airway Management Planned: Oral ETT  Additional Equipment: CVP and Arterial line  Intra-op Plan:   Post-operative Plan: Extubation in OR  Informed Consent: I have reviewed the patients History and  Physical, chart, labs and discussed the procedure including the risks, benefits and alternatives for the proposed anesthesia with the patient or authorized representative who has indicated his/her understanding and acceptance.     Dental advisory given  Plan Discussed with: CRNA  Anesthesia Plan Comments:       Anesthesia Quick Evaluation

## 2021-12-04 NOTE — Progress Notes (Signed)
°   12/04/21 1655  Clinical Encounter Type  Visited With Health care provider  Visit Type ED;Trauma  Referral From Nurse  Consult/Referral To Chaplain  Stress Factors  Patient Stress Factors Not reviewed   Responded to M.C.E.D. Trauma A. Patient not seen by Chaplain as being evaluated and treated by Medical staff at this time. No family present at this time. Staff will page Chaplain upon request of patient or family. [place], M.Min., [phone]
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
## 2021-12-04 NOTE — Anesthesia Procedure Notes (Addendum)
Central Venous Catheter Insertion Performed by: Nolon Nations, MD, anesthesiologist Start/End2/11/2021 7:30 PM, 12/04/2021 8:00 PM Patient location: Pre-op. Preanesthetic checklist: patient identified, IV checked, site marked, risks and benefits discussed, surgical consent, monitors and equipment checked, pre-op evaluation, timeout performed and anesthesia consent Position: supine Lidocaine 1% used for infiltration and patient sedated Hand hygiene performed  and maximum sterile barriers used  Catheter size: 8.5 Fr Sheath introducer Procedure performed using ultrasound guided technique. Ultrasound Notes:anatomy identified, needle tip was noted to be adjacent to the nerve/plexus identified, no ultrasound evidence of intravascular and/or intraneural injection and image(s) printed for medical record Attempts: 1 Following insertion, line sutured, dressing applied and Biopatch. Post procedure assessment: blood return through all ports, free fluid flow and no air  Patient tolerated the procedure well with no immediate complications. Additional procedure comments: Front of c-collar removed to assist in intubation. Remained off during during line placement. Extreme caution was taken to keep c-spine neutral and head immobile. Head remained midline throughout line placement. Collar front replaced immediately after line placed.Marland Kitchen

## 2021-12-04 NOTE — Transfer of Care (Signed)
Immediate Anesthesia Transfer of Care Note  Patient: [name]  Procedure(s) Performed: EXPLORATORY LAPAROTOMY small bowel resection (Abdomen) CLOSED REDUCTION LEFT ANKLE SPLINT (Left: Ankle)  Patient Location: PACU  Anesthesia Type:General  Level of Consciousness: Patient remains intubated per anesthesia plan  Airway & Oxygen Therapy: Patient remains intubated per anesthesia plan and Patient placed on Ventilator (see vital sign flow sheet for setting)  Post-op Assessment: Report given to RN and Post -op Vital signs reviewed and stable  Post vital signs: Reviewed and stable  Last Vitals:  Vitals Value Taken Time  BP 120/73 12/04/21 2224  Temp    Pulse 71 12/04/21 2243  Resp 11 12/04/21 2243  SpO2 100 % 12/04/21 2243  Vitals shown include unvalidated device data.  Last Pain:  Vitals:   12/04/21 1909  TempSrc: Axillary  PainSc:       Patients Stated Pain Goal: 3 (48/54/62 7035)  Complications: No notable events documented.

## 2021-12-04 NOTE — Anesthesia Procedure Notes (Addendum)
Procedure Name: Intubation Date/Time: 12/04/2021 7:32 PM Performed by: Alain Marion, CRNA Pre-anesthesia Checklist: Patient identified, Emergency Drugs available, Suction available and Patient being monitored Patient Re-evaluated:Patient Re-evaluated prior to induction Oxygen Delivery Method: Circle System Utilized Preoxygenation: Pre-oxygenation with 100% oxygen Induction Type: IV induction, Rapid sequence and Cricoid Pressure applied Ventilation: Mask ventilation without difficulty Laryngoscope Size: Glidescope and 3 Grade View: Grade II Tube type: Oral Tube size: 7.0 mm Number of attempts: 1 Airway Equipment and Method: Stylet and Oral airway Placement Confirmation: ETT inserted through vocal cords under direct vision, positive ETCO2 and breath sounds checked- equal and bilateral Secured at: 22 cm Tube secured with: Tape Dental Injury: Teeth and Oropharynx as per pre-operative assessment  Comments: Patient c-spine precautions maintained in line stabilization during laryngoscopy. C-collar in place.
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
## 2021-12-04 NOTE — Anesthesia Procedure Notes (Signed)
Arterial Line Insertion Start/End2/11/2021 7:33 PM, 12/04/2021 7:35 PM Performed by: Nolon Nations, MD, Alain Marion, CRNA, CRNA  Patient location: OR. Preanesthetic checklist: patient identified, IV checked, site marked, risks and benefits discussed, surgical consent, monitors and equipment checked, pre-op evaluation, timeout performed and anesthesia consent Left, radial was placed Catheter size: 20 G Maximum sterile barriers used  Allen's test indicative of satisfactory collateral circulation Attempts: 1 Procedure performed without using ultrasound guided technique. Ultrasound Notes:anatomy identified Following insertion, dressing applied and Biopatch. Post procedure assessment: normal
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
## 2021-12-04 NOTE — ED Notes (Signed)
C-collar placed.
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
## 2021-12-04 NOTE — ED Notes (Signed)
2 attempt by EMT for MBP, unsuccessful
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
## 2021-12-05 ENCOUNTER — Inpatient Hospital Stay (HOSPITAL_COMMUNITY): Payer: Medicare Other

## 2021-12-05 ENCOUNTER — Encounter (HOSPITAL_COMMUNITY): Payer: Self-pay | Admitting: Surgery

## 2021-12-05 LAB — GLUCOSE, CAPILLARY
Glucose-Capillary: 103 mg/dL — ABNORMAL HIGH (ref 70–99)
Glucose-Capillary: 110 mg/dL — ABNORMAL HIGH (ref 70–99)
Glucose-Capillary: 124 mg/dL — ABNORMAL HIGH (ref 70–99)
Glucose-Capillary: 124 mg/dL — ABNORMAL HIGH (ref 70–99)
Glucose-Capillary: 127 mg/dL — ABNORMAL HIGH (ref 70–99)
Glucose-Capillary: 141 mg/dL — ABNORMAL HIGH (ref 70–99)
Glucose-Capillary: 146 mg/dL — ABNORMAL HIGH (ref 70–99)

## 2021-12-05 LAB — URINALYSIS, ROUTINE W REFLEX MICROSCOPIC
Bilirubin Urine: NEGATIVE
Glucose, UA: NEGATIVE mg/dL
Ketones, ur: NEGATIVE mg/dL
Nitrite: NEGATIVE
Protein, ur: NEGATIVE mg/dL
Specific Gravity, Urine: 1.01 (ref 1.005–1.030)
pH: 5.5 (ref 5.0–8.0)

## 2021-12-05 LAB — POCT I-STAT 7, (LYTES, BLD GAS, ICA,H+H)
Acid-base deficit: 12 mmol/L — ABNORMAL HIGH (ref 0.0–2.0)
Acid-base deficit: 12 mmol/L — ABNORMAL HIGH (ref 0.0–2.0)
Acid-base deficit: 12 mmol/L — ABNORMAL HIGH (ref 0.0–2.0)
Acid-base deficit: 7 mmol/L — ABNORMAL HIGH (ref 0.0–2.0)
Acid-base deficit: 9 mmol/L — ABNORMAL HIGH (ref 0.0–2.0)
Bicarbonate: 16.3 mmol/L — ABNORMAL LOW (ref 20.0–28.0)
Bicarbonate: 16.8 mmol/L — ABNORMAL LOW (ref 20.0–28.0)
Bicarbonate: 17.6 mmol/L — ABNORMAL LOW (ref 20.0–28.0)
Bicarbonate: 18.5 mmol/L — ABNORMAL LOW (ref 20.0–28.0)
Bicarbonate: 19 mmol/L — ABNORMAL LOW (ref 20.0–28.0)
Calcium, Ion: 1.11 mmol/L — ABNORMAL LOW (ref 1.15–1.40)
Calcium, Ion: 1.12 mmol/L — ABNORMAL LOW (ref 1.15–1.40)
Calcium, Ion: 1.14 mmol/L — ABNORMAL LOW (ref 1.15–1.40)
Calcium, Ion: 1.19 mmol/L (ref 1.15–1.40)
Calcium, Ion: 1.27 mmol/L (ref 1.15–1.40)
HCT: 32 % — ABNORMAL LOW (ref 36.0–46.0)
HCT: 34 % — ABNORMAL LOW (ref 36.0–46.0)
HCT: 35 % — ABNORMAL LOW (ref 36.0–46.0)
HCT: 35 % — ABNORMAL LOW (ref 36.0–46.0)
HCT: 35 % — ABNORMAL LOW (ref 36.0–46.0)
Hemoglobin: 10.9 g/dL — ABNORMAL LOW (ref 12.0–15.0)
Hemoglobin: 11.6 g/dL — ABNORMAL LOW (ref 12.0–15.0)
Hemoglobin: 11.9 g/dL — ABNORMAL LOW (ref 12.0–15.0)
Hemoglobin: 11.9 g/dL — ABNORMAL LOW (ref 12.0–15.0)
Hemoglobin: 11.9 g/dL — ABNORMAL LOW (ref 12.0–15.0)
O2 Saturation: 91 %
O2 Saturation: 91 %
O2 Saturation: 92 %
O2 Saturation: 94 %
O2 Saturation: 99 %
Patient temperature: 96.9
Patient temperature: 97.4
Patient temperature: 98
Patient temperature: 98
Patient temperature: 98.3
Potassium: 3.7 mmol/L (ref 3.5–5.1)
Potassium: 3.7 mmol/L (ref 3.5–5.1)
Potassium: 3.8 mmol/L (ref 3.5–5.1)
Potassium: 3.8 mmol/L (ref 3.5–5.1)
Potassium: 4 mmol/L (ref 3.5–5.1)
Sodium: 142 mmol/L (ref 135–145)
Sodium: 142 mmol/L (ref 135–145)
Sodium: 142 mmol/L (ref 135–145)
Sodium: 142 mmol/L (ref 135–145)
Sodium: 143 mmol/L (ref 135–145)
TCO2: 18 mmol/L — ABNORMAL LOW (ref 22–32)
TCO2: 18 mmol/L — ABNORMAL LOW (ref 22–32)
TCO2: 19 mmol/L — ABNORMAL LOW (ref 22–32)
TCO2: 20 mmol/L — ABNORMAL LOW (ref 22–32)
TCO2: 20 mmol/L — ABNORMAL LOW (ref 22–32)
pCO2 arterial: 35.6 mmHg (ref 32.0–48.0)
pCO2 arterial: 45.3 mmHg (ref 32.0–48.0)
pCO2 arterial: 47.9 mmHg (ref 32.0–48.0)
pCO2 arterial: 48 mmHg (ref 32.0–48.0)
pCO2 arterial: 52.3 mmHg — ABNORMAL HIGH (ref 32.0–48.0)
pH, Arterial: 7.13 — CL (ref 7.350–7.450)
pH, Arterial: 7.151 — CL (ref 7.350–7.450)
pH, Arterial: 7.163 — CL (ref 7.350–7.450)
pH, Arterial: 7.203 — ABNORMAL LOW (ref 7.350–7.450)
pH, Arterial: 7.322 — ABNORMAL LOW (ref 7.350–7.450)
pO2, Arterial: 198 mmHg — ABNORMAL HIGH (ref 83.0–108.0)
pO2, Arterial: 66 mmHg — ABNORMAL LOW (ref 83.0–108.0)
pO2, Arterial: 77 mmHg — ABNORMAL LOW (ref 83.0–108.0)
pO2, Arterial: 80 mmHg — ABNORMAL LOW (ref 83.0–108.0)
pO2, Arterial: 85 mmHg (ref 83.0–108.0)

## 2021-12-05 LAB — URINALYSIS, MICROSCOPIC (REFLEX)
RBC / HPF: >50 RBC/hpf (ref 0–5)
WBC, UA: >50 WBC/hpf (ref 0–5)

## 2021-12-05 LAB — BPAM FFP
Blood Product Expiration Date: 202302032359
Blood Product Expiration Date: 202302032359
ISSUE DATE / TIME: 202302012107
ISSUE DATE / TIME: 202302012107
Unit Type and Rh: 6200
Unit Type and Rh: 6200

## 2021-12-05 LAB — PROTIME-INR
INR: 1.1 (ref 0.8–1.2)
Prothrombin Time: 14.5 seconds (ref 11.4–15.2)

## 2021-12-05 LAB — BASIC METABOLIC PANEL
Anion gap: 11 (ref 5–15)
BUN: 26 mg/dL — ABNORMAL HIGH (ref 8–23)
CO2: 16 mmol/L — ABNORMAL LOW (ref 22–32)
Calcium: 7.7 mg/dL — ABNORMAL LOW (ref 8.9–10.3)
Chloride: 111 mmol/L (ref 98–111)
Creatinine, Ser: 1.85 mg/dL — ABNORMAL HIGH (ref 0.44–1.00)
GFR, Estimated: 30 mL/min — ABNORMAL LOW (ref >60)
Glucose, Bld: 136 mg/dL — ABNORMAL HIGH (ref 70–99)
Potassium: 4.1 mmol/L (ref 3.5–5.1)
Sodium: 138 mmol/L (ref 135–145)

## 2021-12-05 LAB — CBC
HCT: 36.1 % (ref 36.0–46.0)
Hemoglobin: 12 g/dL (ref 12.0–15.0)
MCH: 30.7 pg (ref 26.0–34.0)
MCHC: 33.2 g/dL (ref 30.0–36.0)
MCV: 92.3 fL (ref 80.0–100.0)
Platelets: 124 10*3/uL — ABNORMAL LOW (ref 150–400)
RBC: 3.91 MIL/uL (ref 3.87–5.11)
RDW: 18 % — ABNORMAL HIGH (ref 11.5–15.5)
WBC: 10.5 10*3/uL (ref 4.0–10.5)
nRBC: 0 % (ref 0.0–0.2)

## 2021-12-05 LAB — PREPARE FRESH FROZEN PLASMA: Unit division: 0

## 2021-12-05 LAB — LACTIC ACID, PLASMA: Lactic Acid, Venous: 2 mmol/L (ref 0.5–1.9)

## 2021-12-05 LAB — MRSA NEXT GEN BY PCR, NASAL: MRSA by PCR Next Gen: NOT DETECTED

## 2021-12-05 LAB — HIV ANTIBODY (ROUTINE TESTING W REFLEX): HIV Screen 4th Generation wRfx: NONREACTIVE

## 2021-12-05 LAB — TRIGLYCERIDES: Triglycerides: 254 mg/dL — ABNORMAL HIGH (ref <150)

## 2021-12-05 MED ORDER — SODIUM BICARBONATE 8.4 % IV SOLN
50.0000 meq | Freq: Once | INTRAVENOUS | Status: AC
  Administered 2021-12-05: 50 meq via INTRAVENOUS
  Filled 2021-12-05: qty 50

## 2021-12-05 MED ORDER — ALBUMIN HUMAN 5 % IV SOLN
25.0000 g | Freq: Once | INTRAVENOUS | Status: AC
Start: 2021-12-05 — End: 2021-12-05
  Administered 2021-12-05: 25 g via INTRAVENOUS
  Filled 2021-12-05: qty 500

## 2021-12-05 MED ORDER — LACTATED RINGERS IV BOLUS
1000.0000 mL | Freq: Once | INTRAVENOUS | Status: AC
  Administered 2021-12-05: 1000 mL via INTRAVENOUS

## 2021-12-05 MED ORDER — PANTOPRAZOLE SODIUM 40 MG IV SOLR
40.0000 mg | INTRAVENOUS | Status: DC
  Administered 2021-12-05 – 2021-12-07 (×3): 40 mg via INTRAVENOUS
  Filled 2021-12-05 (×3): qty 40

## 2021-12-05 MED ORDER — CHLORHEXIDINE GLUCONATE CLOTH 2 % EX PADS
6.0000 | MEDICATED_PAD | Freq: Every day | CUTANEOUS | Status: DC
  Administered 2021-12-05 – 2021-12-13 (×8): 6 via TOPICAL

## 2021-12-05 MED ORDER — ALBUMIN HUMAN 5 % IV SOLN
12.5000 g | Freq: Once | INTRAVENOUS | Status: AC
  Administered 2021-12-05: 12.5 g via INTRAVENOUS
  Filled 2021-12-05: qty 250

## 2021-12-05 MED ORDER — CHLORHEXIDINE GLUCONATE 0.12% ORAL RINSE (MEDLINE KIT)
15.0000 mL | Freq: Two times a day (BID) | OROMUCOSAL | Status: DC
  Administered 2021-12-05 – 2021-12-13 (×15): 15 mL via OROMUCOSAL

## 2021-12-05 MED ORDER — ORAL CARE MOUTH RINSE
15.0000 mL | OROMUCOSAL | Status: DC
  Administered 2021-12-05 – 2021-12-13 (×59): 15 mL via OROMUCOSAL

## 2021-12-05 MED ORDER — HYDRALAZINE HCL 20 MG/ML IJ SOLN
10.0000 mg | Freq: Four times a day (QID) | INTRAMUSCULAR | Status: DC | PRN
  Administered 2021-12-07 – 2021-12-08 (×2): 10 mg via INTRAVENOUS
  Filled 2021-12-05 (×3): qty 1

## 2021-12-05 MED ORDER — FENTANYL 2500MCG IN NS 250ML (10MCG/ML) PREMIX INFUSION
0.0000 ug/h | INTRAVENOUS | Status: DC
  Administered 2021-12-05: 25 ug/h via INTRAVENOUS
  Filled 2021-12-05: qty 250

## 2021-12-05 NOTE — Progress Notes (Signed)
Subjective: Patient reports  intubated  Objective: Vital signs in last 24 hours: Temp:  [96 F (35.6 C)-97.7 F (36.5 C)] 97.7 F (36.5 C) (02/02 0400) Pulse Rate:  [69-110] 110 (02/02 0750) Resp:  [9-29] 16 (02/02 0750) BP: (89-156)/(52-81) 140/67 (02/02 0750) SpO2:  [95 %-100 %] 95 % (02/02 0750) Arterial Line BP: (122-171)/(61-81) 149/71 (02/02 0730) FiO2 (%):  [40 %-100 %] 40 % (02/02 0750) Weight:  [97.5 kg] 97.5 kg (02/01 2230)  Intake/Output from previous day: 02/01 0701 - 02/02 0700 In: 7093.8 [I.V.:3696.6; Blood:1347; IV Piggyback:2050.2] Out: 1015 [Urine:515; Blood:500] Intake/Output this shift: No intake/output data recorded.  Patient remains intubated and sedated although will arouse to stimulation and voice does appear to move all extremities to commands globally weak but I feel this is related to her sedation intubation.  Lab Results: Recent Labs    12/04/21 2253 12/05/21 0012 12/05/21 0206 12/05/21 0622  WBC 8.5  --   --  10.5  HGB 12.0   < > 11.9* 12.0  HCT 36.0   < > 35.0* 36.1  PLT 121*  --   --  124*   < > = values in this interval not displayed.   BMET Recent Labs    12/04/21 2253 12/05/21 0012 12/05/21 0206 12/05/21 0622  NA 136   < > 142 138  K 3.6   < > 3.7 4.1  CL 110  --   --  111  CO2 16*  --   --  16*  GLUCOSE 158*  --   --  136*  BUN 27*  --   --  26*  CREATININE 1.88*  --   --  1.85*  CALCIUM 6.8*  --   --  7.7*   < > = values in this interval not displayed.    Studies/Results: DG Tibia/Fibula Left  Result Date: 12/04/2021 CLINICAL DATA:  Closed reduction of open ankle fracture. EXAM: LEFT TIBIA AND FIBULA - 2 VIEW COMPARISON:  Left ankle x-ray 12/04/2021. FINDINGS: Intraoperative left ankle. 8 low resolution intraoperative spot views of the left ankle were obtained. Interval reduction of ankle fracture. There is likely nondisplaced medial malleolar fracture. Distal fibular fracture is now in near anatomic alignment. Total  fluoroscopy time: 56.8 seconds Total radiation dose: 2.74 micro Gy IMPRESSION: As above. Electronically Signed   By: Ronney Asters M.D.   On: 12/04/2021 23:02   CT HEAD WO CONTRAST  Addendum Date: 12/04/2021   ADDENDUM REPORT: 12/04/2021 19:11 ADDENDUM: Additional findings of colonic thickening and styloid process fractures were communicated as outlined below. These results were called by telephone at the time of interpretation on 12/04/2021 at 7:10 pm to provider Michaelle Birks, who verbally acknowledged these results. Electronically Signed   By: Zetta Bills M.D.   On: 12/04/2021 19:11   Result Date: 12/04/2021 CLINICAL DATA:  History of blunt trauma in a 66 year old female. EXAM: CT HEAD WITHOUT CONTRAST CT CERVICAL SPINE WITHOUT CONTRAST CT CHEST, ABDOMEN AND PELVIS WITH CONTRAST TECHNIQUE: Contiguous axial images were obtained from the base of the skull through the vertex without intravenous contrast. Multidetector CT imaging of the cervical spine was performed without intravenous contrast. Multiplanar CT image reconstructions were also generated. Multidetector CT imaging of the chest, abdomen and pelvis was performed following the standard protocol during bolus administration of intravenous contrast. RADIATION DOSE REDUCTION: This exam was performed according to the departmental dose-optimization program which includes automated exposure control, adjustment of the mA and/or kV according to patient size and/or  use of iterative reconstruction technique. CONTRAST:  100 mL Omnipaque 300 COMPARISON:  Comparison chest CT and abdominal CTs dating back to 2008 FINDINGS: CT HEAD FINDINGS Brain: Small parafalcine subdural hematoma best seen on image (25/3), no additional signs of intracranial hemorrhage. No mass-effect, midline shift or hydrocephalus. Vascular: No hyperdense vessel or unexpected calcification. Skull: No calvarial abnormality but there are fractures of the bilateral styloid processes in this patient  with known fracture of the cervical spine. Sinuses/Orbits: No acute finding. Other: None CT CERVICAL FINDINGS Alignment: Abnormal alignment at C2 with distraction of posterior elements in the setting of hangman's fracture. Widening of the C2-3 disc space. Alignment throughout the remainder of the cervical spine is unremarkable. Skull base and vertebrae: Hangman's fracture at C2 with fracture through bilateral pedicles of C2 and with distraction of posterior elements and angulation, moderate angulation at the fracture site. Fracture extends into the posterior aspect of the vertebral body on the LEFT passing obliquely through the posterior vertebral body and into the transverse foramen where there is comminution extending into the transverse foramen with distraction of fracture fragments associated with fractures of the LEFT transverse foramen. On the RIGHT fracture extends into the posterior and inferior margin of the transverse foramen. C1 with normal relationship with occipital condyles. No extension of fracture into the a Don toilet. No additional fracture of the cervical spine. Soft tissues and spinal canal: Added density posterior to C2 may represent small amount of hematoma in the central canal, difficult to assess. Retropharyngeal course of the carotid arteries. Disc levels: Multilevel degenerative changes throughout the cervical spine greatest at C6-7 and C7-T1. Other:  None. CT CHEST FINDINGS Cardiovascular: Normal caliber of the thoracic aorta. No stranding adjacent to the thoracic aorta. No mediastinal hematoma. Heart size moderately enlarged with signs of coronary artery calcification. No pericardial effusion. Central pulmonary vessels are unremarkable. Mediastinum/Nodes: Mildly patulous appearance of the esophagus. No pneumomediastinum or signs of mediastinal hematoma. No thoracic inlet, axillary or mediastinal lymphadenopathy. Lungs/Pleura: No pneumothorax. Multiple RIGHT-sided rib fractures. No  consolidation. Patchy ground-glass attenuation more likely related to atelectasis with mild areas of contusion suspected as well. Musculoskeletal: Mildly displaced rib fractures of ribs 2 and 4. Mildly displaced short-segment segmental fracture of the RIGHT fifth and sixth ribs. Mildly displaced fracture of the RIGHT seventh rib laterally and eighth rib as well. Visualized clavicles and scapulae are intact as is the sternum. No displaced rib fractures on the LEFT. CT ABDOMEN PELVIS FINDINGS Hepatobiliary: Perihepatic fluid higher density compatible with hemoperitoneum. No visible hepatic laceration. Mildly irregular area over the dome of the RIGHT hemi liver shows more water density and there are scattered hepatic cysts of similar density throughout the liver. This area in hepatic subsegment VIII shows mildly irregular margins and is contiguous with a small cyst. No surrounding stranding in this area. Numerous cysts present on previous imaging in the RIGHT hepatic lobe though not as large as this area on the current study. Portal vein is patent. Post cholecystectomy. No biliary duct dilation. Pancreas: No signs of pancreatic laceration or peripancreatic stranding. 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 There is a subtle indistinct area of hypoattenuation along the inferior margin (image 64/3) areas of low attenuation along the periphery of the spleen in some areas appear to represent splenic clefts. Another area that is mildly irregular on image 78 of series 6 may also represent a small laceration. 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 Adrenals/Urinary Tract: Post LEFT nephrectomy and adrenalectomy. RIGHT adrenal is normal. Marked RIGHT renal cortical scarring. No hydronephrosis. Urinary bladder is unremarkable. Stomach/Bowel: Gastric distension.  No perigastric stranding.  Small bowel with preserved enhancement. Distal bowel particularly distal ileum without visible enhancement. Signs of mesenteric hematoma in the RIGHT lower quadrant and signs of mesenteric stranding in the mid abdomen. Suspected extravasation of contrast into a sentinel clot in the pelvis. Clot tracks along the upper pelvis and lower abdomen. Less dense fluid is seen adjacent to the spleen and liver. No current evidence of pneumatosis. No free air. Focal colonic thickening at the splenic flexure with adjacent nodularity and small lymph nodes (image 53/3 and image 51/3. There is some adjacent stranding as well. Vascular/Lymphatic: Suspect subtle areas of extravasation in the RIGHT lower quadrant arising from injured small bowel mesentery in the setting of mesenteric injury. The abdominal aorta is normal caliber. Aortic atherosclerosis without aneurysm. There is no gastrohepatic or hepatoduodenal ligament lymphadenopathy. No retroperitoneal or mesenteric lymphadenopathy. No pelvic sidewall lymphadenopathy. Reproductive: Not well evaluated, hematoma surrounds the uterus. No adnexal mass. Other: No free air. Moderate volume hemoperitoneum with sinal clot in the pelvis. Musculoskeletal: Rib fractures as outlined above. Bony pelvis is intact. Spinal degenerative changes without signs of acute injury to the thoracolumbar spine. Body wall contusion along the RIGHT and LEFT flank IMPRESSION: CT of the head: Small parafalcine subdural hematoma. No signs of midline shift or mass effect at this time. Signs of bilateral styloid process fractures in the setting of cervical spine injury. CT of the cervical spine: Unstable fracture at C2 compatible with hangman's fracture with moderate angulation of posterior elements and with extension into bilateral neural foramina, potentially associated with small amount of hematoma in the central canal. CT angiography may be helpful for further assessment of vertebral arteries. Degenerative  changes elsewhere in the cervical spine CT of the chest, abdomen and pelvis: Hemoperitoneum in the setting of mesenteric hematoma in mesenteric injury in the RIGHT lower quadrant and signs of subtle areas of extravasation in the RIGHT lower quadrant arising from injured small bowel mesentery in the setting of mesenteric injury. Signs of hypoperfused bowel/bowel with developing ischemia in the RIGHT lower quadrant. Site of mesenteric injury in the ileal mesentery also likely proximal jejunal mesenteric injury as well. 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 There is perisplenic hemoperitoneum but area of denser blood in the pelvis favors mesenteric source. Irregular low-attenuation area in the dome of the RIGHT hemi liver could represent an area of contusion or laceration superimposed on pre-existing cysts in this area. Consider attention on follow-up. Multiple RIGHT-sided rib fractures, without pneumothorax. Area of thickening at the splenic flexure with adjacent nodularity and small lymph nodes. Findings raise the question of colon cancer. In the setting of trauma colonic thickening could also represent traumatic injury to the splenic flexure. Aortic Atherosclerosis (ICD10-I70.0). Above findings were relayed directly to the surgeon, Dr. Zenia Resides, caring for the patient regarding findings in the mesentery and in the cervical spine as well as equivocal findings in the abdomen related to the liver and spleen. This conversation occurred at approximately 1755 hours on 12/04/2021. Findings of suspected parafalcine subdural hematoma were related via telephone at the time of dictation of the report at approximately 1815 hours. Electronically Signed: By: Zetta Bills M.D. On: 12/04/2021 19:01   CT CERVICAL SPINE WO CONTRAST  Addendum Date: 12/04/2021  ADDENDUM REPORT: 12/04/2021 19:11 ADDENDUM: Additional findings of colonic thickening and styloid process fractures were  communicated as outlined below. These results were called by telephone at the time of interpretation on 12/04/2021 at 7:10 pm to provider Michaelle Birks, who verbally acknowledged these results. Electronically Signed   By: Zetta Bills M.D.   On: 12/04/2021 19:11   Result Date: 12/04/2021 CLINICAL DATA:  History of blunt trauma in a 66 year old female. EXAM: CT HEAD WITHOUT CONTRAST CT CERVICAL SPINE WITHOUT CONTRAST CT CHEST, ABDOMEN AND PELVIS WITH CONTRAST TECHNIQUE: Contiguous axial images were obtained from the base of the skull through the vertex without intravenous contrast. Multidetector CT imaging of the cervical spine was performed without intravenous contrast. Multiplanar CT image reconstructions were also generated. Multidetector CT imaging of the chest, abdomen and pelvis was performed following the standard protocol during bolus administration of intravenous contrast. RADIATION DOSE REDUCTION: This exam was performed according to the departmental dose-optimization program which includes automated exposure control, adjustment of the mA and/or kV according to patient size and/or use of iterative reconstruction technique. CONTRAST:  100 mL Omnipaque 300 COMPARISON:  Comparison chest CT and abdominal CTs dating back to 2008 FINDINGS: CT HEAD FINDINGS Brain: Small parafalcine subdural hematoma best seen on image (25/3), no additional signs of intracranial hemorrhage. No mass-effect, midline shift or hydrocephalus. Vascular: No hyperdense vessel or unexpected calcification. Skull: No calvarial abnormality but there are fractures of the bilateral styloid processes in this patient with known fracture of the cervical spine. Sinuses/Orbits: No acute finding. Other: None CT CERVICAL FINDINGS Alignment: Abnormal alignment at C2 with distraction of posterior elements in the setting of hangman's fracture. Widening of the C2-3 disc space. Alignment throughout the remainder of the cervical spine is unremarkable. Skull  base and vertebrae: Hangman's fracture at C2 with fracture through bilateral pedicles of C2 and with distraction of posterior elements and angulation, moderate angulation at the fracture site. Fracture extends into the posterior aspect of the vertebral body on the LEFT passing obliquely through the posterior vertebral body and into the transverse foramen where there is comminution extending into the transverse foramen with distraction of fracture fragments associated with fractures of the LEFT transverse foramen. On the RIGHT fracture extends into the posterior and inferior margin of the transverse foramen. C1 with normal relationship with occipital condyles. No extension of fracture into the a Don toilet. No additional fracture of the cervical spine. Soft tissues and spinal canal: Added density posterior to C2 may represent small amount of hematoma in the central canal, difficult to assess. Retropharyngeal course of the carotid arteries. Disc levels: Multilevel degenerative changes throughout the cervical spine greatest at C6-7 and C7-T1. Other:  None. CT CHEST FINDINGS Cardiovascular: Normal caliber of the thoracic aorta. No stranding adjacent to the thoracic aorta. No mediastinal hematoma. Heart size moderately enlarged with signs of coronary artery calcification. No pericardial effusion. Central pulmonary vessels are unremarkable. Mediastinum/Nodes: Mildly patulous appearance of the esophagus. No pneumomediastinum or signs of mediastinal hematoma. No thoracic inlet, axillary or mediastinal lymphadenopathy. Lungs/Pleura: No pneumothorax. Multiple RIGHT-sided rib fractures. No consolidation. Patchy ground-glass attenuation more likely related to atelectasis with mild areas of contusion suspected as well. Musculoskeletal: Mildly displaced rib fractures of ribs 2 and 4. Mildly displaced short-segment segmental fracture of the RIGHT fifth and sixth ribs. Mildly displaced fracture of the RIGHT seventh rib laterally  and eighth rib as well. Visualized clavicles and scapulae are intact as is the sternum. No displaced rib fractures on the LEFT. CT ABDOMEN  PELVIS FINDINGS Hepatobiliary: Perihepatic fluid higher density compatible with hemoperitoneum. No visible hepatic laceration. Mildly irregular area over the dome of the RIGHT hemi liver shows more water density and there are scattered hepatic cysts of similar density throughout the liver. This area in hepatic subsegment VIII shows mildly irregular margins and is contiguous with a small cyst. No surrounding stranding in this area. Numerous cysts present on previous imaging in the RIGHT hepatic lobe though not as large as this area on the current study. Portal vein is patent. Post cholecystectomy. No biliary duct dilation. Pancreas: No signs of pancreatic laceration or peripancreatic stranding. 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 There is a subtle indistinct area of hypoattenuation along the inferior margin (image 64/3) areas of low attenuation along the periphery of the spleen in some areas appear to represent splenic clefts. Another area that is mildly irregular on image 78 of series 6 may also represent a small laceration. 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 Adrenals/Urinary Tract: Post LEFT nephrectomy and adrenalectomy. RIGHT adrenal is normal. Marked RIGHT renal cortical scarring. No hydronephrosis. Urinary bladder is unremarkable. Stomach/Bowel: Gastric distension.  No perigastric stranding. Small bowel with preserved enhancement. Distal bowel particularly distal ileum without visible enhancement. Signs of mesenteric hematoma in the RIGHT lower quadrant and signs of mesenteric stranding in the mid abdomen. Suspected extravasation of contrast into a sentinel clot in the pelvis. Clot tracks along the upper pelvis and lower  abdomen. Less dense fluid is seen adjacent to the spleen and liver. No current evidence of pneumatosis. No free air. Focal colonic thickening at the splenic flexure with adjacent nodularity and small lymph nodes (image 53/3 and image 51/3. There is some adjacent stranding as well. Vascular/Lymphatic: Suspect subtle areas of extravasation in the RIGHT lower quadrant arising from injured small bowel mesentery in the setting of mesenteric injury. The abdominal aorta is normal caliber. Aortic atherosclerosis without aneurysm. There is no gastrohepatic or hepatoduodenal ligament lymphadenopathy. No retroperitoneal or mesenteric lymphadenopathy. No pelvic sidewall lymphadenopathy. Reproductive: Not well evaluated, hematoma surrounds the uterus. No adnexal mass. Other: No free air. Moderate volume hemoperitoneum with sinal clot in the pelvis. Musculoskeletal: Rib fractures as outlined above. Bony pelvis is intact. Spinal degenerative changes without signs of acute injury to the thoracolumbar spine. Body wall contusion along the RIGHT and LEFT flank IMPRESSION: CT of the head: Small parafalcine subdural hematoma. No signs of midline shift or mass effect at this time. Signs of bilateral styloid process fractures in the setting of cervical spine injury. CT of the cervical spine: Unstable fracture at C2 compatible with hangman's fracture with moderate angulation of posterior elements and with extension into bilateral neural foramina, potentially associated with small amount of hematoma in the central canal. CT angiography may be helpful for further assessment of vertebral arteries. Degenerative changes elsewhere in the cervical spine CT of the chest, abdomen and pelvis: Hemoperitoneum in the setting of mesenteric hematoma in mesenteric injury in the RIGHT lower quadrant and signs of subtle areas of extravasation in the RIGHT lower quadrant arising from injured small bowel mesentery in the setting of mesenteric injury. Signs of  hypoperfused bowel/bowel with developing ischemia in the RIGHT lower quadrant. Site of mesenteric injury in the ileal mesentery also likely proximal jejunal mesenteric injury as well. 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 There is perisplenic hemoperitoneum  but area of denser blood in the pelvis favors mesenteric source. Irregular low-attenuation area in the dome of the RIGHT hemi liver could represent an area of contusion or laceration superimposed on pre-existing cysts in this area. Consider attention on follow-up. Multiple RIGHT-sided rib fractures, without pneumothorax. Area of thickening at the splenic flexure with adjacent nodularity and small lymph nodes. Findings raise the question of colon cancer. In the setting of trauma colonic thickening could also represent traumatic injury to the splenic flexure. Aortic Atherosclerosis (ICD10-I70.0). Above findings were relayed directly to the surgeon, Dr. Zenia Resides, caring for the patient regarding findings in the mesentery and in the cervical spine as well as equivocal findings in the abdomen related to the liver and spleen. This conversation occurred at approximately 1755 hours on 12/04/2021. Findings of suspected parafalcine subdural hematoma were related via telephone at the time of dictation of the report at approximately 1815 hours. Electronically Signed: By: Zetta Bills M.D. On: 12/04/2021 19:01   DG Pelvis Portable  Result Date: 12/04/2021 CLINICAL DATA:  Trauma. EXAM: PORTABLE PELVIS 1-2 VIEWS COMPARISON:  None. FINDINGS: Examination is limited secondary to technique. There is no evidence of pelvic fracture or diastasis. No pelvic bone lesions are seen. IMPRESSION: Limited evaluation.  No acute fracture or dislocation identified. Electronically Signed   By: Ronney Asters M.D.   On: 12/04/2021 17:28   CT CHEST ABDOMEN PELVIS W CONTRAST  Addendum Date: 12/04/2021   ADDENDUM REPORT: 12/04/2021 19:11  ADDENDUM: Additional findings of colonic thickening and styloid process fractures were communicated as outlined below. These results were called by telephone at the time of interpretation on 12/04/2021 at 7:10 pm to provider Michaelle Birks, who verbally acknowledged these results. Electronically Signed   By: Zetta Bills M.D.   On: 12/04/2021 19:11   Result Date: 12/04/2021 CLINICAL DATA:  History of blunt trauma in a 66 year old female. EXAM: CT HEAD WITHOUT CONTRAST CT CERVICAL SPINE WITHOUT CONTRAST CT CHEST, ABDOMEN AND PELVIS WITH CONTRAST TECHNIQUE: Contiguous axial images were obtained from the base of the skull through the vertex without intravenous contrast. Multidetector CT imaging of the cervical spine was performed without intravenous contrast. Multiplanar CT image reconstructions were also generated. Multidetector CT imaging of the chest, abdomen and pelvis was performed following the standard protocol during bolus administration of intravenous contrast. RADIATION DOSE REDUCTION: This exam was performed according to the departmental dose-optimization program which includes automated exposure control, adjustment of the mA and/or kV according to patient size and/or use of iterative reconstruction technique. CONTRAST:  100 mL Omnipaque 300 COMPARISON:  Comparison chest CT and abdominal CTs dating back to 2008 FINDINGS: CT HEAD FINDINGS Brain: Small parafalcine subdural hematoma best seen on image (25/3), no additional signs of intracranial hemorrhage. No mass-effect, midline shift or hydrocephalus. Vascular: No hyperdense vessel or unexpected calcification. Skull: No calvarial abnormality but there are fractures of the bilateral styloid processes in this patient with known fracture of the cervical spine. Sinuses/Orbits: No acute finding. Other: None CT CERVICAL FINDINGS Alignment: Abnormal alignment at C2 with distraction of posterior elements in the setting of hangman's fracture. Widening of the C2-3 disc  space. Alignment throughout the remainder of the cervical spine is unremarkable. Skull base and vertebrae: Hangman's fracture at C2 with fracture through bilateral pedicles of C2 and with distraction of posterior elements and angulation, moderate angulation at the fracture site. Fracture extends into the posterior aspect of the vertebral body on the LEFT passing obliquely through the posterior vertebral body and into the transverse  foramen where there is comminution extending into the transverse foramen with distraction of fracture fragments associated with fractures of the LEFT transverse foramen. On the RIGHT fracture extends into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

## 2021-12-05 NOTE — Progress Notes (Signed)
°  Transition of Care [facility]) Screening Note   Patient Details  Name: [name] Date of Birth: October 25, 1956   Transition of Care [facility]) CM/SW Contact:    Ella Bodo, RN Phone Number: 12/05/2021, 4:48 PM    Transition of Care Department [facility]) has reviewed patient and no TOC needs have been identified at this time. We will continue to monitor patient advancement through interdisciplinary progression rounds. If new patient transition needs arise, please place a TOC consult.  [name], RN, BSN  Trauma/Neuro ICU Case Manager [phone]

## 2021-12-05 NOTE — Anesthesia Postprocedure Evaluation (Signed)
Anesthesia Post Note  Patient: [name]  Procedure(s) Performed: EXPLORATORY LAPAROTOMY small bowel resection (Abdomen) CLOSED REDUCTION LEFT ANKLE SPLINT (Left: Ankle)     Patient location during evaluation: SICU Anesthesia Type: General Level of consciousness: sedated and patient remains intubated per anesthesia plan Pain management: pain level controlled Vital Signs Assessment: post-procedure vital signs reviewed and stable Respiratory status: patient remains intubated per anesthesia plan and patient on ventilator - see flowsheet for VS Cardiovascular status: stable Anesthetic complications: no   No notable events documented.  Last Vitals:  Vitals:   12/05/21 0230 12/05/21 0300  BP: 125/79 125/74  Pulse: 83 85  Resp: (!) 26 (!) 26  Temp:    SpO2: 96% 97%    Last Pain:  Vitals:   12/04/21 1909  TempSrc: Axillary  PainSc:                  Nolon Nations

## 2021-12-05 NOTE — Progress Notes (Signed)
Orthopedic Tech Progress Note Patient Details:  [name] 08/25/1956 [id number]  Ortho Devices Type of Ortho Device: Rigid collar Ortho Device/Splint Location: neck Ortho Device/Splint Interventions: [name] [name] 12/05/2021, 11:48 AM Delivered aspen collar to room and spoke with nurse.

## 2021-12-05 NOTE — Progress Notes (Signed)
OT Cancellation Note  Patient Details Name: [name] MRN: [id number] DOB: Sep 06, 1956   Cancelled Treatment:    Reason Eval/Treat Not Completed: Patient at procedure or test/ unavailable.  OT to continue efforts as appropriate.    [name] D [name] 12/05/2021, 10:25 AM

## 2021-12-05 NOTE — Progress Notes (Signed)
Subjective: 1 Day Post-Op Procedure(s) (LRB): EXPLORATORY LAPAROTOMY small bowel resection (N/A) CLOSED REDUCTION LEFT ANKLE SPLINT (Left)  Patient remains intubated and sedated on vent in the ICU.   Objective:   VITALS:  Temp:  [97 F (36.1 C)-98.3 F (36.8 C)] 98.3 F (36.8 C) (02/02 2000) Pulse Rate:  [70-115] 92 (02/02 2010) Resp:  [9-31] 30 (02/02 2010) BP: (104-172)/(66-93) 145/66 (02/02 2010) SpO2:  [94 %-100 %] 95 % (02/02 2010) Arterial Line BP: (133-179)/(64-91) 146/67 (02/02 2000) FiO2 (%):  [40 %-60 %] 60 % (02/02 2010)  Gen: Intubated and sedated on vent  Left lower extremity: Well padded short leg splint in place CR<2s Unable to assess motor or sensory status    LABS Recent Labs    12/04/21 2253 12/05/21 0012 12/05/21 0112 12/05/21 0206 12/05/21 0622 12/05/21 1057 12/05/21 1456  HGB 12.0   < > 11.9* 11.9* 12.0 11.6* 10.9*  WBC 8.5  --   --   --  10.5  --   --   PLT 121*  --   --   --  124*  --   --    < > = values in this interval not displayed.   Recent Labs    12/04/21 2253 12/05/21 0012 12/05/21 0622 12/05/21 1057 12/05/21 1456  NA 136   < > 138 142 142  K 3.6   < > 4.1 4.0 3.7  CL 110  --  111  --   --   CO2 16*  --  16*  --   --   BUN 27*  --  26*  --   --   CREATININE 1.88*  --  1.85*  --   --   GLUCOSE 158*  --  136*  --   --    < > = values in this interval not displayed.   Recent Labs    12/04/21 2253 12/05/21 0622  INR 1.3* 1.1     Assessment/Plan: 1 Day Post-Op Procedure(s) (LRB): EXPLORATORY LAPAROTOMY small bowel resection (N/A) CLOSED REDUCTION LEFT ANKLE SPLINT (Left)  -strict NWB operative extremity, maximum elevation of limb on three pillows to minimize swelling (must offload heel) -maintain short leg splint -CT of the left ankle for preoperative planning when patient is stable -pain meds and VTE ppx per primary team -she will eventually require ORIF left ankle whenever cleared by the trauma team  Armond Hang 12/05/2021, 11:29 PM

## 2021-12-05 NOTE — Progress Notes (Signed)
PT Cancellation Note  Patient Details Name: [name] MRN: [id number] DOB: 1956/10/28   Cancelled Treatment:    Reason Eval/Treat Not Completed: Patient not medically ready; patient sedated on vent.  Will attempt another day.   Reginia Naas 12/05/2021, 5:49 PM Magda Kiel, PT Acute Rehabilitation Services Pager:[phone] Office:[phone] 12/05/2021

## 2021-12-05 NOTE — Progress Notes (Signed)
Patient examined at bedside after arrival in ICU. Pressors are off with MAPs in 80s-90s. Creatinine downtrending, good UOP. Patient has metabolic and respiratory acidosis with bicarb of 16 and pCO2 of 54, but is hemodynamically much improved. RR increased, will repeat ABG in 1 hour.
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
## 2021-12-05 NOTE — Progress Notes (Signed)
Contacted [employer] at [phone] regarding changing patient's cervical collar. Ronalee Belts recommended getting an order for an adult Designer, jewellery. Will follow through.

## 2021-12-06 ENCOUNTER — Inpatient Hospital Stay (HOSPITAL_COMMUNITY): Payer: Medicare Other

## 2021-12-06 LAB — POCT I-STAT 7, (LYTES, BLD GAS, ICA,H+H)
Acid-base deficit: 6 mmol/L — ABNORMAL HIGH (ref 0.0–2.0)
Acid-base deficit: 5 mmol/L — ABNORMAL HIGH (ref 0.0–2.0)
Bicarbonate: 19.7 mmol/L — ABNORMAL LOW (ref 20.0–28.0)
Bicarbonate: 20.1 mmol/L (ref 20.0–28.0)
Calcium, Ion: 1.15 mmol/L (ref 1.15–1.40)
Calcium, Ion: 1.15 mmol/L (ref 1.15–1.40)
HCT: 30 % — ABNORMAL LOW (ref 36.0–46.0)
HCT: 31 % — ABNORMAL LOW (ref 36.0–46.0)
Hemoglobin: 10.2 g/dL — ABNORMAL LOW (ref 12.0–15.0)
Hemoglobin: 10.5 g/dL — ABNORMAL LOW (ref 12.0–15.0)
O2 Saturation: 95 %
O2 Saturation: 92 %
Patient temperature: 98.4
Potassium: 3.6 mmol/L (ref 3.5–5.1)
Potassium: 3.8 mmol/L (ref 3.5–5.1)
Sodium: 140 mmol/L (ref 135–145)
Sodium: 139 mmol/L (ref 135–145)
TCO2: 21 mmol/L — ABNORMAL LOW (ref 22–32)
TCO2: 21 mmol/L — ABNORMAL LOW (ref 22–32)
pCO2 arterial: 37.1 mmHg (ref 32.0–48.0)
pCO2 arterial: 37.1 mmHg (ref 32.0–48.0)
pH, Arterial: 7.334 — ABNORMAL LOW (ref 7.350–7.450)
pH, Arterial: 7.342 — ABNORMAL LOW (ref 7.350–7.450)
pO2, Arterial: 82 mmHg — ABNORMAL LOW (ref 83.0–108.0)
pO2, Arterial: 68 mmHg — ABNORMAL LOW (ref 83.0–108.0)

## 2021-12-06 LAB — CBC
HCT: 30 % — ABNORMAL LOW (ref 36.0–46.0)
Hemoglobin: 10.5 g/dL — ABNORMAL LOW (ref 12.0–15.0)
MCH: 31.6 pg (ref 26.0–34.0)
MCHC: 35 g/dL (ref 30.0–36.0)
MCV: 90.4 fL (ref 80.0–100.0)
Platelets: 113 10*3/uL — ABNORMAL LOW (ref 150–400)
RBC: 3.32 MIL/uL — ABNORMAL LOW (ref 3.87–5.11)
RDW: 18.6 % — ABNORMAL HIGH (ref 11.5–15.5)
WBC: 11.7 10*3/uL — ABNORMAL HIGH (ref 4.0–10.5)
nRBC: 0 % (ref 0.0–0.2)

## 2021-12-06 LAB — GLUCOSE, CAPILLARY
Glucose-Capillary: 102 mg/dL — ABNORMAL HIGH (ref 70–99)
Glucose-Capillary: 108 mg/dL — ABNORMAL HIGH (ref 70–99)
Glucose-Capillary: 113 mg/dL — ABNORMAL HIGH (ref 70–99)
Glucose-Capillary: 125 mg/dL — ABNORMAL HIGH (ref 70–99)
Glucose-Capillary: 98 mg/dL (ref 70–99)
Glucose-Capillary: 99 mg/dL (ref 70–99)

## 2021-12-06 LAB — BASIC METABOLIC PANEL
Anion gap: 8 (ref 5–15)
BUN: 22 mg/dL (ref 8–23)
CO2: 18 mmol/L — ABNORMAL LOW (ref 22–32)
Calcium: 7.6 mg/dL — ABNORMAL LOW (ref 8.9–10.3)
Chloride: 110 mmol/L (ref 98–111)
Creatinine, Ser: 1.76 mg/dL — ABNORMAL HIGH (ref 0.44–1.00)
GFR, Estimated: 31 mL/min — ABNORMAL LOW (ref >60)
Glucose, Bld: 122 mg/dL — ABNORMAL HIGH (ref 70–99)
Potassium: 3.6 mmol/L (ref 3.5–5.1)
Sodium: 136 mmol/L (ref 135–145)

## 2021-12-06 LAB — HEMOGLOBIN A1C
Hgb A1c MFr Bld: 5.6 % (ref 4.8–5.6)
Mean Plasma Glucose: 114 mg/dL

## 2021-12-06 MED ORDER — GADOBUTROL 1 MMOL/ML IV SOLN
9.5000 mL | Freq: Once | INTRAVENOUS | Status: AC | PRN
  Administered 2021-12-06: 9.5 mL via INTRAVENOUS

## 2021-12-06 MED ORDER — LOSARTAN POTASSIUM 50 MG PO TABS
50.0000 mg | ORAL_TABLET | Freq: Every day | ORAL | Status: DC
  Filled 2021-12-06: qty 1

## 2021-12-06 MED ORDER — METHOCARBAMOL 500 MG PO TABS
1000.0000 mg | ORAL_TABLET | Freq: Three times a day (TID) | ORAL | Status: DC
  Administered 2021-12-06 – 2021-12-08 (×6): 1000 mg
  Filled 2021-12-06 (×6): qty 2

## 2021-12-06 MED ORDER — LACTATED RINGERS IV SOLN: INTRAVENOUS | Status: DC

## 2021-12-06 MED ORDER — HEPARIN SODIUM (PORCINE) 5000 UNIT/ML IJ SOLN
5000.0000 [IU] | Freq: Three times a day (TID) | INTRAMUSCULAR | Status: DC
  Administered 2021-12-06 – 2021-12-18 (×35): 5000 [IU] via SUBCUTANEOUS
  Filled 2021-12-06 (×35): qty 1

## 2021-12-06 MED ORDER — METOPROLOL TARTRATE 25 MG PO TABS
25.0000 mg | ORAL_TABLET | Freq: Two times a day (BID) | ORAL | Status: DC
  Administered 2021-12-06 – 2021-12-07 (×3): 25 mg via NASOGASTRIC
  Filled 2021-12-06 (×2): qty 1

## 2021-12-06 MED ORDER — OXYCODONE HCL 5 MG/5ML PO SOLN
5.0000 mg | ORAL | Status: DC | PRN
  Administered 2021-12-06 (×3): 10 mg
  Filled 2021-12-06 (×3): qty 10

## 2021-12-06 MED ORDER — METOPROLOL TARTRATE 5 MG/5ML IV SOLN
5.0000 mg | Freq: Four times a day (QID) | INTRAVENOUS | Status: DC | PRN
  Administered 2021-12-06 – 2021-12-07 (×2): 5 mg via INTRAVENOUS
  Filled 2021-12-06: qty 5

## 2021-12-06 MED ORDER — ALBUMIN HUMAN 5 % IV SOLN
25.0000 g | Freq: Once | INTRAVENOUS | Status: AC
  Administered 2021-12-06: 25 g via INTRAVENOUS
  Filled 2021-12-06: qty 500

## 2021-12-06 MED ORDER — LOSARTAN POTASSIUM 50 MG PO TABS
50.0000 mg | ORAL_TABLET | Freq: Every day | ORAL | Status: DC
  Administered 2021-12-06: 50 mg via NASOGASTRIC

## 2021-12-06 MED ORDER — METOPROLOL TARTRATE 25 MG PO TABS
25.0000 mg | ORAL_TABLET | Freq: Two times a day (BID) | ORAL | Status: DC
  Filled 2021-12-06: qty 1

## 2021-12-06 MED ORDER — METOPROLOL TARTRATE 5 MG/5ML IV SOLN
INTRAVENOUS | Status: AC
  Filled 2021-12-06: qty 5

## 2021-12-06 MED ORDER — ACETAMINOPHEN 10 MG/ML IV SOLN
1000.0000 mg | Freq: Four times a day (QID) | INTRAVENOUS | Status: AC
  Administered 2021-12-06 – 2021-12-07 (×4): 1000 mg via INTRAVENOUS
  Filled 2021-12-06 (×4): qty 100
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
## 2021-12-06 NOTE — Progress Notes (Signed)
PT Cancellation Note  Patient Details Name: [name] MRN: [id number] DOB: May 05, 1956   Cancelled Treatment:    Reason Eval/Treat Not Completed: Medical issues which prohibited therapy. PT attempting to clarify if pt needs lateral C-spine x-ray prior to mobilizing. Will follow up once clarification is provided or lateral x-rays are completed.   Zenaida Niece 12/06/2021, 1:28 PM
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
## 2021-12-06 NOTE — Procedures (Signed)
Extubation Procedure Note  Patient Details:   Name: [name] DOB: 1956-03-29 MRN: [id number]   Airway Documentation:    Vent end date: 12/06/21 Vent end time: 1106   Evaluation  O2 sats: stable throughout Complications: No apparent complications Patient did tolerate procedure well. Bilateral Breath Sounds: Clear, Diminished   Yes, pt could speak post extubation.  Pt extubated to 6 l/m [place] per physician's order.  Earney Navy 12/06/2021, 11:07 AM

## 2021-12-07 ENCOUNTER — Inpatient Hospital Stay (HOSPITAL_COMMUNITY): Payer: Medicare Other

## 2021-12-07 LAB — CBC
HCT: 30 % — ABNORMAL LOW (ref 36.0–46.0)
Hemoglobin: 10 g/dL — ABNORMAL LOW (ref 12.0–15.0)
MCH: 30.9 pg (ref 26.0–34.0)
MCHC: 33.3 g/dL (ref 30.0–36.0)
MCV: 92.6 fL (ref 80.0–100.0)
Platelets: 115 10*3/uL — ABNORMAL LOW (ref 150–400)
RBC: 3.24 MIL/uL — ABNORMAL LOW (ref 3.87–5.11)
RDW: 18.8 % — ABNORMAL HIGH (ref 11.5–15.5)
WBC: 12.5 10*3/uL — ABNORMAL HIGH (ref 4.0–10.5)
nRBC: 0 % (ref 0.0–0.2)

## 2021-12-07 LAB — BASIC METABOLIC PANEL
Anion gap: 8 (ref 5–15)
BUN: 23 mg/dL (ref 8–23)
CO2: 21 mmol/L — ABNORMAL LOW (ref 22–32)
Calcium: 7.8 mg/dL — ABNORMAL LOW (ref 8.9–10.3)
Chloride: 110 mmol/L (ref 98–111)
Creatinine, Ser: 1.59 mg/dL — ABNORMAL HIGH (ref 0.44–1.00)
GFR, Estimated: 35 mL/min — ABNORMAL LOW (ref >60)
Glucose, Bld: 103 mg/dL — ABNORMAL HIGH (ref 70–99)
Potassium: 3.8 mmol/L (ref 3.5–5.1)
Sodium: 139 mmol/L (ref 135–145)

## 2021-12-07 LAB — GLUCOSE, CAPILLARY
Glucose-Capillary: 99 mg/dL (ref 70–99)
Glucose-Capillary: 107 mg/dL — ABNORMAL HIGH (ref 70–99)
Glucose-Capillary: 103 mg/dL — ABNORMAL HIGH (ref 70–99)
Glucose-Capillary: 93 mg/dL (ref 70–99)
Glucose-Capillary: 105 mg/dL — ABNORMAL HIGH (ref 70–99)

## 2021-12-07 MED ORDER — ACETAMINOPHEN 500 MG PO TABS
1000.0000 mg | ORAL_TABLET | Freq: Three times a day (TID) | ORAL | Status: DC
  Administered 2021-12-07 – 2021-12-15 (×22): 1000 mg via ORAL
  Filled 2021-12-07 (×23): qty 2

## 2021-12-07 MED ORDER — METOPROLOL TARTRATE 25 MG PO TABS
25.0000 mg | ORAL_TABLET | Freq: Two times a day (BID) | ORAL | Status: DC
  Administered 2021-12-07 – 2021-12-18 (×22): 25 mg via ORAL
  Filled 2021-12-07 (×22): qty 1

## 2021-12-07 MED ORDER — PRAVASTATIN SODIUM 10 MG PO TABS
20.0000 mg | ORAL_TABLET | Freq: Every day | ORAL | Status: DC
  Administered 2021-12-07 – 2021-12-17 (×11): 20 mg via ORAL
  Filled 2021-12-07 (×11): qty 2

## 2021-12-07 MED ORDER — OXYCODONE HCL 5 MG PO TABS
5.0000 mg | ORAL_TABLET | ORAL | Status: DC | PRN
  Administered 2021-12-07 – 2021-12-08 (×4): 10 mg via ORAL
  Administered 2021-12-08: 5 mg via ORAL
  Administered 2021-12-08 – 2021-12-12 (×12): 10 mg via ORAL
  Administered 2021-12-12: 5 mg via ORAL
  Administered 2021-12-13 – 2021-12-15 (×7): 10 mg via ORAL
  Filled 2021-12-07 (×4): qty 2
  Filled 2021-12-07: qty 1
  Filled 2021-12-07: qty 2
  Filled 2021-12-07: qty 1
  Filled 2021-12-07 (×15): qty 2
  Filled 2021-12-07: qty 1
  Filled 2021-12-07 (×3): qty 2

## 2021-12-07 MED ORDER — HYDROMORPHONE HCL 1 MG/ML IJ SOLN
0.5000 mg | INTRAMUSCULAR | Status: DC | PRN
  Administered 2021-12-07 – 2021-12-15 (×16): 0.5 mg via INTRAVENOUS
  Filled 2021-12-07 (×4): qty 0.5
  Filled 2021-12-07: qty 1
  Filled 2021-12-07: qty 0.5
  Filled 2021-12-07 (×6): qty 1
  Filled 2021-12-07: qty 0.5
  Filled 2021-12-07 (×3): qty 1
  Filled 2021-12-07: qty 0.5

## 2021-12-07 MED ORDER — GABAPENTIN 300 MG PO CAPS
300.0000 mg | ORAL_CAPSULE | Freq: Three times a day (TID) | ORAL | Status: DC
  Administered 2021-12-07 – 2021-12-18 (×30): 300 mg via ORAL
  Filled 2021-12-07 (×30): qty 1

## 2021-12-07 MED ORDER — DOCUSATE SODIUM 100 MG PO CAPS
100.0000 mg | ORAL_CAPSULE | Freq: Two times a day (BID) | ORAL | Status: DC
  Administered 2021-12-07 – 2021-12-18 (×22): 100 mg via ORAL
  Filled 2021-12-07 (×22): qty 1

## 2021-12-07 NOTE — Progress Notes (Signed)
Overall stable.  No new pain or neurologic issues.  Patient tolerated extubation well.  Follow-up lateral C-spine yesterday demonstrated good appearance of her hangman's fracture of C2.  Continue with collar.  Mobilize with therapy.
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
## 2021-12-07 NOTE — Progress Notes (Signed)
Subjective: 3 Days Post-Op Procedure(s) (LRB): EXPLORATORY LAPAROTOMY small bowel resection (N/A) CLOSED REDUCTION LEFT ANKLE SPLINT (Left)  Patient reports pain as moderate. She is now extubated and conversant. She remains in ICU stepdown level care.   Objective:   VITALS:  Temp:  [98.3 F (36.8 C)-98.6 F (37 C)] 98.4 F (36.9 C) (02/04 1200) Pulse Rate:  [87-113] 91 (02/04 1400) Resp:  [10-34] 14 (02/04 1400) BP: (124-177)/(79-95) 158/88 (02/04 1400) SpO2:  [88 %-93 %] 91 % (02/04 1400) Arterial Line BP: (107-172)/(73-83) 107/83 (02/04 0000)  Gen: AAOx3, NAD  Left lower extremity: Well padded short leg splint in place Wiggles toes SILT over toes CR<2s    LABS Recent Labs    12/05/21 1456 12/06/21 0451 12/06/21 0548 12/06/21 1202 12/07/21 0608  HGB 10.9* 10.2* 10.5* 10.5* 10.0*  WBC  --   --  11.7*  --  12.5*  PLT  --   --  113*  --  115*   Recent Labs    12/06/21 0548 12/06/21 1202 12/07/21 0608  NA 136 139 139  K 3.6 3.8 3.8  CL 110  --  110  CO2 18*  --  21*  BUN 22  --  23  CREATININE 1.76*  --  1.59*  GLUCOSE 122*  --  103*   Recent Labs    12/04/21 2253 12/05/21 0622  INR 1.3* 1.1     Assessment/Plan: 3 Days Post-Op Procedure(s) (LRB): EXPLORATORY LAPAROTOMY small bowel resection (N/A) CLOSED REDUCTION LEFT ANKLE SPLINT (Left)  -strict NWB operative extremity, maximum elevation of limb on three pillows to minimize swelling (must offload heel) -maintain short leg splint -CT of the left ankle reviewed for preoperative planning  -pain meds and VTE ppx per primary team -she will eventually require ORIF left ankle whenever cleared by the trauma team (will plan for OR on Wednesday morning 12/11/21)  Armond Hang 12/07/2021, 3:40 PM

## 2021-12-07 NOTE — Progress Notes (Signed)
Inpatient Rehab Admissions Coordinator:   Per therapy recommendations patient was screened for CIR candidacy by Clemens Catholic, MS, CCC-SLP . At this time, Pt. is  not yet tolerating OOB and does not demonstrate the ability to tolerate the intensity of CIR.  Pt. may have potential to progress to becoming a potential CIR candidate, so CIR admissions team will follow and monitor for progress and participation with therapies and place consult order if Pt. appears to be an appropriate candidate. Please contact me with any questions.   Clemens Catholic, [place], [facility] Admissions Coordinator  [phone] ([place]) [phone] (office)

## 2021-12-08 ENCOUNTER — Inpatient Hospital Stay (HOSPITAL_COMMUNITY): Payer: Medicare Other

## 2021-12-08 LAB — GLUCOSE, CAPILLARY
Glucose-Capillary: 110 mg/dL — ABNORMAL HIGH (ref 70–99)
Glucose-Capillary: 106 mg/dL — ABNORMAL HIGH (ref 70–99)
Glucose-Capillary: 98 mg/dL (ref 70–99)
Glucose-Capillary: 102 mg/dL — ABNORMAL HIGH (ref 70–99)
Glucose-Capillary: 96 mg/dL (ref 70–99)

## 2021-12-08 LAB — BPAM RBC
Blood Product Expiration Date: 202302282359
Blood Product Expiration Date: 202302282359
Blood Product Expiration Date: 202302282359
Blood Product Expiration Date: 202302282359
Blood Product Expiration Date: 202302282359
Blood Product Expiration Date: 202303012359
Blood Product Expiration Date: 202303012359
Blood Product Expiration Date: 202303012359
Blood Product Expiration Date: 202303052359
Blood Product Expiration Date: 202303052359
ISSUE DATE / TIME: 202302011727
ISSUE DATE / TIME: 202302011838
ISSUE DATE / TIME: 202302011936
ISSUE DATE / TIME: 202302011936
ISSUE DATE / TIME: 202302012006
ISSUE DATE / TIME: 202302012006
ISSUE DATE / TIME: 202302012006
ISSUE DATE / TIME: 202302012006
Unit Type and Rh: 5100
Unit Type and Rh: 5100
Unit Type and Rh: 6200
Unit Type and Rh: 6200
Unit Type and Rh: 6200
Unit Type and Rh: 6200
Unit Type and Rh: 6200
Unit Type and Rh: 6200
Unit Type and Rh: 6200
Unit Type and Rh: 6200

## 2021-12-08 LAB — TYPE AND SCREEN
ABO/RH(D): A POS
Antibody Screen: NEGATIVE
Unit division: 0
Unit division: 0
Unit division: 0
Unit division: 0
Unit division: 0
Unit division: 0
Unit division: 0
Unit division: 0
Unit division: 0
Unit division: 0

## 2021-12-08 LAB — BASIC METABOLIC PANEL
Anion gap: 11 (ref 5–15)
BUN: 22 mg/dL (ref 8–23)
CO2: 22 mmol/L (ref 22–32)
Calcium: 8.5 mg/dL — ABNORMAL LOW (ref 8.9–10.3)
Chloride: 107 mmol/L (ref 98–111)
Creatinine, Ser: 1.46 mg/dL — ABNORMAL HIGH (ref 0.44–1.00)
GFR, Estimated: 39 mL/min — ABNORMAL LOW (ref >60)
Glucose, Bld: 102 mg/dL — ABNORMAL HIGH (ref 70–99)
Potassium: 4.1 mmol/L (ref 3.5–5.1)
Sodium: 140 mmol/L (ref 135–145)

## 2021-12-08 LAB — CBC
HCT: 31 % — ABNORMAL LOW (ref 36.0–46.0)
Hemoglobin: 10.4 g/dL — ABNORMAL LOW (ref 12.0–15.0)
MCH: 30.8 pg (ref 26.0–34.0)
MCHC: 33.5 g/dL (ref 30.0–36.0)
MCV: 91.7 fL (ref 80.0–100.0)
Platelets: 126 10*3/uL — ABNORMAL LOW (ref 150–400)
RBC: 3.38 MIL/uL — ABNORMAL LOW (ref 3.87–5.11)
RDW: 18.5 % — ABNORMAL HIGH (ref 11.5–15.5)
WBC: 12.1 10*3/uL — ABNORMAL HIGH (ref 4.0–10.5)
nRBC: 0.2 % (ref 0.0–0.2)

## 2021-12-08 MED ORDER — METHOCARBAMOL 500 MG PO TABS
1000.0000 mg | ORAL_TABLET | Freq: Three times a day (TID) | ORAL | Status: DC
  Administered 2021-12-08 – 2021-12-15 (×18): 1000 mg via ORAL
  Filled 2021-12-08 (×20): qty 2
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
## 2021-12-08 NOTE — Progress Notes (Signed)
Orthopedic Tech Progress Note Patient Details:  [name] [birth year] [id number]   Ortho Devices Type of Ortho Device: Knee Immobilizer Ortho Device/Splint Location: RLE Ortho Device/Splint Interventions: Ordered, Application, Adjustment   Post Interventions Patient Tolerated: Well Instructions Provided: Care of device, Adjustment of device  Nadav Swindell Jeri Modena 12/08/2021, 12:06 PM
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
## 2021-12-08 NOTE — Progress Notes (Signed)
Notified as on call provider for [facility] by primary team that the patient was complaining of right knee pain. Xrays were obtained and demonstrate:   1. Nondisplaced fracture of the proximal tibial metaphysis with a fracture cleft extending to the articular surface of the medial tibial plateau. 2. Oblique nondisplaced fracture of the proximal fibular metaphysis.  Contacted my attending, Dr. Rolena Infante who advised we order a CT scan of the knee and place the patient in a knee immobilizer. I will place these orders. Plan will be for Dr. Kathaleen Bury to see patient tomorrow.   Nelson Chimes PA-C
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
## 2021-12-09 ENCOUNTER — Encounter (HOSPITAL_COMMUNITY): Payer: Self-pay

## 2021-12-09 ENCOUNTER — Other Ambulatory Visit: Payer: Self-pay | Admitting: Oncology

## 2021-12-09 LAB — GLUCOSE, CAPILLARY
Glucose-Capillary: 100 mg/dL — ABNORMAL HIGH (ref 70–99)
Glucose-Capillary: 104 mg/dL — ABNORMAL HIGH (ref 70–99)
Glucose-Capillary: 107 mg/dL — ABNORMAL HIGH (ref 70–99)
Glucose-Capillary: 92 mg/dL (ref 70–99)
Glucose-Capillary: 96 mg/dL (ref 70–99)
Glucose-Capillary: 98 mg/dL (ref 70–99)
Glucose-Capillary: 98 mg/dL (ref 70–99)

## 2021-12-09 LAB — BASIC METABOLIC PANEL
Anion gap: 11 (ref 5–15)
BUN: 26 mg/dL — ABNORMAL HIGH (ref 8–23)
CO2: 24 mmol/L (ref 22–32)
Calcium: 8.8 mg/dL — ABNORMAL LOW (ref 8.9–10.3)
Chloride: 103 mmol/L (ref 98–111)
Creatinine, Ser: 1.46 mg/dL — ABNORMAL HIGH (ref 0.44–1.00)
GFR, Estimated: 39 mL/min — ABNORMAL LOW (ref >60)
Glucose, Bld: 103 mg/dL — ABNORMAL HIGH (ref 70–99)
Potassium: 4 mmol/L (ref 3.5–5.1)
Sodium: 138 mmol/L (ref 135–145)

## 2021-12-09 LAB — CBC
HCT: 30.1 % — ABNORMAL LOW (ref 36.0–46.0)
Hemoglobin: 9.6 g/dL — ABNORMAL LOW (ref 12.0–15.0)
MCH: 29.9 pg (ref 26.0–34.0)
MCHC: 31.9 g/dL (ref 30.0–36.0)
MCV: 93.8 fL (ref 80.0–100.0)
Platelets: 129 10*3/uL — ABNORMAL LOW (ref 150–400)
RBC: 3.21 MIL/uL — ABNORMAL LOW (ref 3.87–5.11)
RDW: 17.9 % — ABNORMAL HIGH (ref 11.5–15.5)
WBC: 9.4 10*3/uL (ref 4.0–10.5)
nRBC: 0.4 % — ABNORMAL HIGH (ref 0.0–0.2)

## 2021-12-09 MED ORDER — CEFAZOLIN SODIUM-DEXTROSE 2-4 GM/100ML-% IV SOLN
2.0000 g | INTRAVENOUS | Status: AC
  Administered 2021-12-10: 2 g via INTRAVENOUS
  Filled 2021-12-09: qty 100

## 2021-12-09 NOTE — Progress Notes (Signed)
Patient doing well from a neck perspective.  Does not have much neck pain.  Denies any numbness tingling or pain down her arms.  Continues to wear cervical collar.  Continue supportive care for now.  No new neurosurgical recommendations
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
## 2021-12-10 ENCOUNTER — Inpatient Hospital Stay (HOSPITAL_COMMUNITY): Payer: Medicare Other

## 2021-12-10 ENCOUNTER — Encounter (HOSPITAL_COMMUNITY): Admission: EM | Disposition: A | Discharge status: Skilled Nursing Facility | Payer: Self-pay | Source: Home / Self Care

## 2021-12-10 ENCOUNTER — Inpatient Hospital Stay (HOSPITAL_COMMUNITY): Payer: Medicare Other | Admitting: Certified Registered"

## 2021-12-10 ENCOUNTER — Encounter (HOSPITAL_COMMUNITY): Payer: Self-pay

## 2021-12-10 DIAGNOSIS — T07XXXA Unspecified multiple injuries, initial encounter: Secondary | ICD-10-CM

## 2021-12-10 HISTORY — PX: ORIF ANKLE FRACTURE: SHX5408

## 2021-12-10 HISTORY — PX: ORIF TIBIA PLATEAU: SHX2132

## 2021-12-10 LAB — GLUCOSE, CAPILLARY
Glucose-Capillary: 93 mg/dL (ref 70–99)
Glucose-Capillary: 99 mg/dL (ref 70–99)
Glucose-Capillary: 140 mg/dL — ABNORMAL HIGH (ref 70–99)
Glucose-Capillary: 122 mg/dL — ABNORMAL HIGH (ref 70–99)
Glucose-Capillary: 121 mg/dL — ABNORMAL HIGH (ref 70–99)

## 2021-12-10 SURGERY — OPEN REDUCTION INTERNAL FIXATION (ORIF) TIBIAL PLATEAU
Anesthesia: General | Site: Ankle | Laterality: Right

## 2021-12-10 MED ORDER — ONDANSETRON HCL 4 MG/2ML IJ SOLN
INTRAMUSCULAR | Status: AC
  Filled 2021-12-10: qty 2

## 2021-12-10 MED ORDER — FENTANYL CITRATE (PF) 100 MCG/2ML IJ SOLN
INTRAMUSCULAR | Status: AC
  Filled 2021-12-10: qty 2

## 2021-12-10 MED ORDER — ROCURONIUM BROMIDE 10 MG/ML (PF) SYRINGE
PREFILLED_SYRINGE | INTRAVENOUS | Status: DC | PRN
  Administered 2021-12-10: 60 mg via INTRAVENOUS
  Administered 2021-12-10: 100 mg via INTRAVENOUS
  Administered 2021-12-10: 40 mg via INTRAVENOUS

## 2021-12-10 MED ORDER — ROCURONIUM BROMIDE 10 MG/ML (PF) SYRINGE
PREFILLED_SYRINGE | INTRAVENOUS | Status: AC
  Filled 2021-12-10: qty 10

## 2021-12-10 MED ORDER — DEXAMETHASONE SODIUM PHOSPHATE 10 MG/ML IJ SOLN
INTRAMUSCULAR | Status: AC
  Filled 2021-12-10: qty 1

## 2021-12-10 MED ORDER — ONDANSETRON HCL 4 MG/2ML IJ SOLN: 4.0000 mg | Freq: Once | INTRAMUSCULAR | Status: DC | PRN

## 2021-12-10 MED ORDER — ONDANSETRON HCL 4 MG/2ML IJ SOLN
INTRAMUSCULAR | Status: DC | PRN
  Administered 2021-12-10: 4 mg via INTRAVENOUS

## 2021-12-10 MED ORDER — FENTANYL CITRATE (PF) 100 MCG/2ML IJ SOLN
25.0000 ug | INTRAMUSCULAR | Status: DC | PRN
  Administered 2021-12-10 (×2): 25 ug via INTRAVENOUS

## 2021-12-10 MED ORDER — SUGAMMADEX SODIUM 200 MG/2ML IV SOLN
INTRAVENOUS | Status: DC | PRN
  Administered 2021-12-10: 200 mg via INTRAVENOUS

## 2021-12-10 MED ORDER — PHENYLEPHRINE HCL-NACL 20-0.9 MG/250ML-% IV SOLN
INTRAVENOUS | Status: DC | PRN
  Administered 2021-12-10: 50 ug/min via INTRAVENOUS

## 2021-12-10 MED ORDER — PROPOFOL 10 MG/ML IV BOLUS
INTRAVENOUS | Status: AC
  Filled 2021-12-10: qty 20

## 2021-12-10 MED ORDER — CHLORHEXIDINE GLUCONATE 0.12 % MT SOLN
OROMUCOSAL | Status: AC
  Administered 2021-12-10: 15 mL via OROMUCOSAL
  Filled 2021-12-10: qty 15

## 2021-12-10 MED ORDER — CEFAZOLIN SODIUM-DEXTROSE 2-4 GM/100ML-% IV SOLN
2.0000 g | Freq: Three times a day (TID) | INTRAVENOUS | Status: AC
  Administered 2021-12-10 – 2021-12-11 (×3): 2 g via INTRAVENOUS
  Filled 2021-12-10 (×3): qty 100

## 2021-12-10 MED ORDER — DEXAMETHASONE SODIUM PHOSPHATE 10 MG/ML IJ SOLN
INTRAMUSCULAR | Status: DC | PRN
  Administered 2021-12-10: 10 mg via INTRAVENOUS

## 2021-12-10 MED ORDER — MIDAZOLAM HCL 2 MG/2ML IJ SOLN
INTRAMUSCULAR | Status: AC
  Filled 2021-12-10: qty 2

## 2021-12-10 MED ORDER — PROPOFOL 10 MG/ML IV BOLUS
INTRAVENOUS | Status: DC | PRN
Start: 2021-12-10 — End: 2021-12-10
  Administered 2021-12-10: 100 mg via INTRAVENOUS

## 2021-12-10 MED ORDER — LIDOCAINE 2% (20 MG/ML) 5 ML SYRINGE
INTRAMUSCULAR | Status: DC | PRN
  Administered 2021-12-10: 80 mg via INTRAVENOUS

## 2021-12-10 MED ORDER — 0.9 % SODIUM CHLORIDE (POUR BTL) OPTIME
TOPICAL | Status: DC | PRN
  Administered 2021-12-10 (×2): 1000 mL

## 2021-12-10 MED ORDER — FENTANYL CITRATE (PF) 250 MCG/5ML IJ SOLN
INTRAMUSCULAR | Status: AC
  Filled 2021-12-10: qty 5

## 2021-12-10 MED ORDER — FENTANYL CITRATE (PF) 250 MCG/5ML IJ SOLN
INTRAMUSCULAR | Status: DC | PRN
  Administered 2021-12-10: 50 ug via INTRAVENOUS
  Administered 2021-12-10: 100 ug via INTRAVENOUS
  Administered 2021-12-10 (×5): 50 ug via INTRAVENOUS

## 2021-12-10 MED ORDER — LACTATED RINGERS IV SOLN: INTRAVENOUS | Status: DC

## 2021-12-10 MED ORDER — LIDOCAINE 2% (20 MG/ML) 5 ML SYRINGE
INTRAMUSCULAR | Status: AC
  Filled 2021-12-10: qty 5

## 2021-12-10 MED ORDER — MIDAZOLAM HCL 2 MG/2ML IJ SOLN
INTRAMUSCULAR | Status: DC | PRN
  Administered 2021-12-10: 2 mg via INTRAVENOUS

## 2021-12-10 SURGICAL SUPPLY — 110 items
BAG COUNTER SPONGE SURGICOUNT (BAG) ×7 IMPLANT
BAG SPNG CNTER NS LX DISP (BAG) ×2
BIT DRILL 2.4X140 LONG SOLID (BIT) ×1 IMPLANT
BIT DRILL 2.8 (BIT) ×3
BIT DRILL 3.3 LONG (BIT) ×1 IMPLANT
BIT DRILL CANNULTD 2.6 X 130MM (DRILL) IMPLANT
BIT DRILL LNG 140X2.8XSLD (BIT) IMPLANT
BIT DRILL QC 3.3X195 (BIT) ×1 IMPLANT
BIT DRILL SOLID 2.0 X 110MM (DRILL) IMPLANT
BIT DRL LNG 140X2.8XSLD (BIT) ×2
BLADE CLIPPER SURG (BLADE) IMPLANT
BLADE SURG 10 STRL SS (BLADE) ×3 IMPLANT
BLADE SURG 15 STRL LF DISP TIS (BLADE) ×3 IMPLANT
BLADE SURG 15 STRL SS (BLADE)
BNDG CMPR MED 10X6 ELC LF (GAUZE/BANDAGES/DRESSINGS) ×2
BNDG COHESIVE 4X5 TAN STRL (GAUZE/BANDAGES/DRESSINGS) ×3 IMPLANT
BNDG ELASTIC 4X5.8 VLCR STR LF (GAUZE/BANDAGES/DRESSINGS) ×5 IMPLANT
BNDG ELASTIC 6X10 VLCR STRL LF (GAUZE/BANDAGES/DRESSINGS) ×1 IMPLANT
BNDG ELASTIC 6X5.8 VLCR STR LF (GAUZE/BANDAGES/DRESSINGS) ×4 IMPLANT
BNDG GAUZE ELAST 4 BULKY (GAUZE/BANDAGES/DRESSINGS) ×14 IMPLANT
BRUSH SCRUB EZ PLAIN DRY (MISCELLANEOUS) ×14 IMPLANT
CANISTER SUCT 3000ML PPV (MISCELLANEOUS) ×4 IMPLANT
CAP LOCK NCB (Cap) ×3 IMPLANT
COVER MAYO STAND STRL (DRAPES) ×4 IMPLANT
COVER SURGICAL LIGHT HANDLE (MISCELLANEOUS) ×8 IMPLANT
DRAPE C-ARM 42X72 X-RAY (DRAPES) ×7 IMPLANT
DRAPE C-ARMOR (DRAPES) ×7 IMPLANT
DRAPE HALF SHEET 40X57 (DRAPES) ×4 IMPLANT
DRAPE INCISE IOBAN 66X45 STRL (DRAPES) ×4 IMPLANT
DRESSING MEPILEX FLEX 4X4 (GAUZE/BANDAGES/DRESSINGS) IMPLANT
DRILL CANNULATED 2.6 X 130MM (DRILL) ×3
DRILL SOLID 2.0 X 110MM (DRILL) ×3
DRSG ADAPTIC 3X8 NADH LF (GAUZE/BANDAGES/DRESSINGS) ×4 IMPLANT
DRSG EMULSION OIL 3X3 NADH (GAUZE/BANDAGES/DRESSINGS) IMPLANT
DRSG MEPILEX BORDER 4X8 (GAUZE/BANDAGES/DRESSINGS) ×1 IMPLANT
DRSG MEPILEX FLEX 4X4 (GAUZE/BANDAGES/DRESSINGS) ×3
DRSG PAD ABDOMINAL 8X10 ST (GAUZE/BANDAGES/DRESSINGS) ×9 IMPLANT
ELECT REM PT RETURN 9FT ADLT (ELECTROSURGICAL) ×6
ELECTRODE REM PT RTRN 9FT ADLT (ELECTROSURGICAL) ×6 IMPLANT
GAUZE SPONGE 4X4 12PLY STRL (GAUZE/BANDAGES/DRESSINGS) ×13 IMPLANT
GLOVE SRG 8 PF TXTR STRL LF DI (GLOVE) ×6 IMPLANT
GLOVE SURG ENC MOIS LTX SZ8 (GLOVE) ×8 IMPLANT
GLOVE SURG ORTHO LTX SZ7.5 (GLOVE) ×16 IMPLANT
GLOVE SURG UNDER POLY LF SZ7.5 (GLOVE) ×8 IMPLANT
GLOVE SURG UNDER POLY LF SZ8 (GLOVE) ×6
GOWN STRL REUS W/ TWL LRG LVL3 (GOWN DISPOSABLE) ×12 IMPLANT
GOWN STRL REUS W/ TWL XL LVL3 (GOWN DISPOSABLE) ×6 IMPLANT
GOWN STRL REUS W/TWL LRG LVL3 (GOWN DISPOSABLE) ×12
GOWN STRL REUS W/TWL XL LVL3 (GOWN DISPOSABLE) ×6
IMMOBILIZER KNEE 22 UNIV (SOFTGOODS) ×4 IMPLANT
K-WIRE 2.0 (WIRE) ×6
K-WIRE FXSTD 280X2XNS SS (WIRE) ×4
K-WIRE SNGL END 1.2X150 (MISCELLANEOUS) ×9
KIT BASIN OR (CUSTOM PROCEDURE TRAY) ×8 IMPLANT
KIT TURNOVER KIT B (KITS) ×8 IMPLANT
KWIRE FXSTD 280X2XNS SS (WIRE) IMPLANT
KWIRE SNGL END 1.2X150 (MISCELLANEOUS) IMPLANT
MANIFOLD NEPTUNE II (INSTRUMENTS) ×4 IMPLANT
NDL HYPO 21X1.5 SAFETY (NEEDLE) IMPLANT
NDL SUT 6 .5 CRC .975X.05 MAYO (NEEDLE) IMPLANT
NEEDLE HYPO 21X1.5 SAFETY (NEEDLE) IMPLANT
NEEDLE MAYO TAPER (NEEDLE)
NS IRRIG 1000ML POUR BTL (IV SOLUTION) ×2 IMPLANT
PACK GENERAL/GYN (CUSTOM PROCEDURE TRAY) ×4 IMPLANT
PACK ORTHO EXTREMITY (CUSTOM PROCEDURE TRAY) ×8 IMPLANT
PAD ARMBOARD 7.5X6 YLW CONV (MISCELLANEOUS) ×14 IMPLANT
PAD CAST 4YDX4 CTTN HI CHSV (CAST SUPPLIES) ×3 IMPLANT
PADDING CAST ABS 6INX4YD NS (CAST SUPPLIES) ×2
PADDING CAST ABS COTTON 6X4 NS (CAST SUPPLIES) IMPLANT
PADDING CAST COTTON 4X4 STRL (CAST SUPPLIES) ×3
PADDING CAST COTTON 6X4 STRL (CAST SUPPLIES) ×4 IMPLANT
PLATE FIBULAR CL 11H LT (Plate) ×1 IMPLANT
PLATE LOCK TIB NCB 132 5H RT (Plate) ×1 IMPLANT
SCREW 4.0X24 ST (Screw) ×2 IMPLANT
SCREW LOCK PLATE R3 2.7X15 (Screw) ×2 IMPLANT
SCREW LOCK PLATE R3 2.7X16 (Screw) ×1 IMPLANT
SCREW LOCK PLATE R3 2.7X18 (Screw) ×1 IMPLANT
SCREW LOCK PLATE R3 3.5X12 (Screw) ×2 IMPLANT
SCREW NCB 4.0 32MM (Screw) ×1 IMPLANT
SCREW NCB 4.0MX30M (Screw) ×1 IMPLANT
SCREW NCB 4.0X26MM (Screw) ×1 IMPLANT
SCREW NCB 4.0X75 CORT S/T (Screw) ×2 IMPLANT
SCREW NCB 4X3 4X70 (Screw) ×1 IMPLANT
SCREW NON LOCK PLATE 2.7X16M (Screw) ×1 IMPLANT
SCREW NON LOCKING 3.5X12 (Screw) ×1 IMPLANT
SCREW NON LOCKING 3.5X14 (Screw) ×1 IMPLANT
SCREW R3CON NONLOCK 4.2X40 (Screw) ×2 IMPLANT
SPONGE T-LAP 18X18 ~~LOC~~+RFID (SPONGE) ×8 IMPLANT
STAPLER VISISTAT 35W (STAPLE) ×4 IMPLANT
STOCKINETTE IMPERVIOUS LG (DRAPES) ×4 IMPLANT
SUCTION FRAZIER HANDLE 10FR (MISCELLANEOUS) ×3
SUCTION TUBE FRAZIER 10FR DISP (MISCELLANEOUS) ×6 IMPLANT
SUT ETHILON 2 0 FS 18 (SUTURE) ×8 IMPLANT
SUT ETHILON 2 0 PSLX (SUTURE) ×2 IMPLANT
SUT PDS AB 2-0 CT1 27 (SUTURE) ×1 IMPLANT
SUT PROLENE 0 CT 2 (SUTURE) ×8 IMPLANT
SUT VIC AB 0 CT1 27 (SUTURE) ×3
SUT VIC AB 0 CT1 27XBRD ANBCTR (SUTURE) ×3 IMPLANT
SUT VIC AB 1 CT1 27 (SUTURE) ×3
SUT VIC AB 1 CT1 27XBRD ANBCTR (SUTURE) ×3 IMPLANT
SUT VIC AB 2-0 CT1 27 (SUTURE) ×12
SUT VIC AB 2-0 CT1 TAPERPNT 27 (SUTURE) ×12 IMPLANT
TOWEL GREEN STERILE (TOWEL DISPOSABLE) ×16 IMPLANT
TOWEL GREEN STERILE FF (TOWEL DISPOSABLE) ×8 IMPLANT
TRAY FOLEY MTR SLVR 16FR STAT (SET/KITS/TRAYS/PACK) ×1 IMPLANT
TUBE CONNECTING 12X1/4 (SUCTIONS) ×8 IMPLANT
UNDERPAD 30X36 HEAVY ABSORB (UNDERPADS AND DIAPERS) ×4 IMPLANT
WATER STERILE IRR 1000ML POUR (IV SOLUTION) ×12 IMPLANT
WIRE OLIVE GUIDE TOWER (WIRE) ×1 IMPLANT
YANKAUER SUCT BULB TIP NO VENT (SUCTIONS) ×4 IMPLANT

## 2021-12-10 NOTE — Anesthesia Procedure Notes (Signed)
Procedure Name: Intubation Date/Time: 12/10/2021 10:36 AM Performed by: Lance Coon, CRNA Pre-anesthesia Checklist: Patient identified, Emergency Drugs available, Suction available, Patient being monitored and Timeout performed Patient Re-evaluated:Patient Re-evaluated prior to induction Oxygen Delivery Method: Circle system utilized Preoxygenation: Pre-oxygenation with 100% oxygen Induction Type: IV induction Ventilation: Mask ventilation without difficulty and Oral airway inserted - appropriate to patient size Laryngoscope Size: Glidescope and 3 Grade View: Grade I Tube type: Oral Tube size: 7.0 mm Number of attempts: 1 Airway Equipment and Method: Stylet and Video-laryngoscopy Placement Confirmation: ETT inserted through vocal cords under direct vision, positive ETCO2 and breath sounds checked- equal and bilateral Secured at: 21 cm Tube secured with: Tape Dental Injury: Teeth and Oropharynx as per pre-operative assessment  Comments: Glidescope due to aspen collar

## 2021-12-10 NOTE — Anesthesia Postprocedure Evaluation (Signed)
Anesthesia Post Note  Patient: [name]  Procedure(s) Performed: OPEN REDUCTION INTERNAL FIXATION (ORIF) TIBIAL PLATEAU (Right) OPEN REDUCTION INTERNAL FIXATION (ORIF) ANKLE FRACTURE (Left: Ankle)     Patient location during evaluation: PACU Anesthesia Type: General Level of consciousness: awake and alert Pain management: pain level controlled Vital Signs Assessment: post-procedure vital signs reviewed and stable Respiratory status: spontaneous breathing, nonlabored ventilation, respiratory function stable and patient connected to nasal cannula oxygen Cardiovascular status: blood pressure returned to baseline and stable Postop Assessment: no apparent nausea or vomiting Anesthetic complications: no   No notable events documented.  Last Vitals:  Vitals:   12/10/21 1540 12/10/21 1600  BP: (!) 156/84 (!) 168/97  Pulse: (!) 110   Resp: (!) 36   Temp:  (!) 36.3 C  SpO2: 97%     Last Pain:  Vitals:   12/10/21 1634  TempSrc:   PainSc: 10-Worst pain ever                 Santa Lighter

## 2021-12-10 NOTE — Progress Notes (Signed)
OT Cancellation Note  Patient Details Name: [name] MRN: [id number] DOB: [birth year]   Cancelled Treatment:    Reason Eval/Treat Not Completed: Patient at procedure or test/ unavailable (off the floor for sx)  [facility] 12/10/2021, 9:36 AM  [address] Pager: [phone] Office: [phone]
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
## 2021-12-10 NOTE — Care Management Important Message (Signed)
Important Message  Patient Details  Name: [name] MRN: [id number] Date of Birth: July 29, 1956   Medicare Important Message Given:  Yes     Hannah Beat 12/10/2021, 1:03 PM
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
## 2021-12-10 NOTE — Progress Notes (Signed)
I discussed with the patient the risks and benefits of surgery for right tibial plateau repair and possible left ankle repair, including the possibility of infection, nerve injury, vessel injury, wound breakdown, arthritis, symptomatic hardware, DVT/ PE, loss of motion, malunion, nonunion, and need for further surgery among others.  We also specifically discussed the possible need to stage surgery of the left ankle if blisters because of the elevated risk of soft tissue breakdown that could lead to amputation.  She acknowledged these risks and wished to proceed.  Altamese Montalvin Manor, MD [facility], [facility] [phone]

## 2021-12-10 NOTE — Transfer of Care (Signed)
Immediate Anesthesia Transfer of Care Note  Patient: [name]  Procedure(s) Performed: OPEN REDUCTION INTERNAL FIXATION (ORIF) TIBIAL PLATEAU (Right) OPEN REDUCTION INTERNAL FIXATION (ORIF) ANKLE FRACTURE (Left: Ankle)  Patient Location: PACU  Anesthesia Type:General  Level of Consciousness: drowsy and patient cooperative  Airway & Oxygen Therapy: Patient Spontanous Breathing and Patient connected to face mask oxygen  Post-op Assessment: Report given to RN and Post -op Vital signs reviewed and stable  Post vital signs: Reviewed and stable  Last Vitals:  Vitals Value Taken Time  BP 145/82 12/10/21 1408  Temp    Pulse 107 12/10/21 1409  Resp 22 12/10/21 1409  SpO2 95 % 12/10/21 1409  Vitals shown include unvalidated device data.  Last Pain:  Vitals:   12/10/21 0750  TempSrc: Oral  PainSc:       Patients Stated Pain Goal: 4 (02/98/47 3085)  Complications: No notable events documented.

## 2021-12-10 NOTE — Progress Notes (Signed)
Oncology Discharge Planning Admission Note  [facility] Address: [address] [place], [place], [place] [address] Hours of Operation:  Nena Polio, Monday - Friday  Clinic Contact Information:  [phone]  Oncology Care Team: Medical Oncologist:  Dr. Octavio Graves, NP is aware of this hospital admission dated 12/04/21.  The cancer center will follow [place] [place] inpatient care to assist with discharge planning as indicated by the oncologist.  We will arrange follow up care closer to discharge.   Disclaimer:  This [place] note does not imply a formal consult request has been made by the admitting attending for this admission or there will be an inpatient consult completed by oncology.  Please request oncology consults as per standard process as indicated.

## 2021-12-11 ENCOUNTER — Encounter (HOSPITAL_COMMUNITY): Payer: Self-pay | Admitting: Orthopedic Surgery

## 2021-12-11 DIAGNOSIS — C7989 Secondary malignant neoplasm of other specified sites: Secondary | ICD-10-CM | POA: Diagnosis not present

## 2021-12-11 LAB — GLUCOSE, CAPILLARY
Glucose-Capillary: 105 mg/dL — ABNORMAL HIGH (ref 70–99)
Glucose-Capillary: 80 mg/dL (ref 70–99)
Glucose-Capillary: 121 mg/dL — ABNORMAL HIGH (ref 70–99)

## 2021-12-11 LAB — CBC
HCT: 25 % — ABNORMAL LOW (ref 36.0–46.0)
Hemoglobin: 8 g/dL — ABNORMAL LOW (ref 12.0–15.0)
MCH: 30.1 pg (ref 26.0–34.0)
MCHC: 32 g/dL (ref 30.0–36.0)
MCV: 94 fL (ref 80.0–100.0)
Platelets: 141 10*3/uL — ABNORMAL LOW (ref 150–400)
RBC: 2.66 MIL/uL — ABNORMAL LOW (ref 3.87–5.11)
RDW: 17.3 % — ABNORMAL HIGH (ref 11.5–15.5)
WBC: 10.5 10*3/uL (ref 4.0–10.5)
nRBC: 0.5 % — ABNORMAL HIGH (ref 0.0–0.2)

## 2021-12-11 LAB — VITAMIN D 25 HYDROXY (VIT D DEFICIENCY, FRACTURES): Vit D, 25-Hydroxy: 33.07 ng/mL (ref 30–100)
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
## 2021-12-11 NOTE — Progress Notes (Signed)
°  Inpatient Rehab Admissions Coordinator :  Per [facility] team recommendations patient was screened for CIR candidacy by Danne Baxter RN MSN. Patient is not at a level to tolerate the intensity required to pursue a CIR admit . PT has changed their recommendation to SNF due to this.Other rehab venues need to be pursued. Please contact me with any questions.  Danne Baxter RN MSN Admissions Coordinator [phone]
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
## 2021-12-12 ENCOUNTER — Inpatient Hospital Stay (HOSPITAL_COMMUNITY): Payer: Medicare Other

## 2021-12-12 ENCOUNTER — Other Ambulatory Visit: Payer: Self-pay

## 2021-12-12 DIAGNOSIS — C801 Malignant (primary) neoplasm, unspecified: Secondary | ICD-10-CM | POA: Diagnosis not present

## 2021-12-12 DIAGNOSIS — C787 Secondary malignant neoplasm of liver and intrahepatic bile duct: Secondary | ICD-10-CM | POA: Diagnosis not present

## 2021-12-12 LAB — BASIC METABOLIC PANEL
Anion gap: 10 (ref 5–15)
BUN: 35 mg/dL — ABNORMAL HIGH (ref 8–23)
CO2: 24 mmol/L (ref 22–32)
Calcium: 8.9 mg/dL (ref 8.9–10.3)
Chloride: 102 mmol/L (ref 98–111)
Creatinine, Ser: 1.72 mg/dL — ABNORMAL HIGH (ref 0.44–1.00)
GFR, Estimated: 32 mL/min — ABNORMAL LOW (ref >60)
Glucose, Bld: 86 mg/dL (ref 70–99)
Potassium: 3.9 mmol/L (ref 3.5–5.1)
Sodium: 136 mmol/L (ref 135–145)

## 2021-12-12 LAB — GLUCOSE, CAPILLARY
Glucose-Capillary: 102 mg/dL — ABNORMAL HIGH (ref 70–99)
Glucose-Capillary: 123 mg/dL — ABNORMAL HIGH (ref 70–99)
Glucose-Capillary: 213 mg/dL — ABNORMAL HIGH (ref 70–99)
Glucose-Capillary: 89 mg/dL (ref 70–99)
Glucose-Capillary: 89 mg/dL (ref 70–99)
Glucose-Capillary: 92 mg/dL (ref 70–99)
Glucose-Capillary: 94 mg/dL (ref 70–99)

## 2021-12-12 LAB — CBC
HCT: 22.8 % — ABNORMAL LOW (ref 36.0–46.0)
Hemoglobin: 7.4 g/dL — ABNORMAL LOW (ref 12.0–15.0)
MCH: 30.8 pg (ref 26.0–34.0)
MCHC: 32.5 g/dL (ref 30.0–36.0)
MCV: 95 fL (ref 80.0–100.0)
Platelets: 141 10*3/uL — ABNORMAL LOW (ref 150–400)
RBC: 2.4 MIL/uL — ABNORMAL LOW (ref 3.87–5.11)
RDW: 17.4 % — ABNORMAL HIGH (ref 11.5–15.5)
WBC: 10.3 10*3/uL (ref 4.0–10.5)
nRBC: 0.5 % — ABNORMAL HIGH (ref 0.0–0.2)

## 2021-12-12 LAB — CEA: CEA: 5.8 ng/mL — ABNORMAL HIGH (ref 0.0–4.7)
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
## 2021-12-13 LAB — CBC
HCT: 21.7 % — ABNORMAL LOW (ref 36.0–46.0)
Hemoglobin: 7.1 g/dL — ABNORMAL LOW (ref 12.0–15.0)
MCH: 31 pg (ref 26.0–34.0)
MCHC: 32.7 g/dL (ref 30.0–36.0)
MCV: 94.8 fL (ref 80.0–100.0)
Platelets: 158 10*3/uL (ref 150–400)
RBC: 2.29 MIL/uL — ABNORMAL LOW (ref 3.87–5.11)
RDW: 17.5 % — ABNORMAL HIGH (ref 11.5–15.5)
WBC: 10.2 10*3/uL (ref 4.0–10.5)
nRBC: 0.4 % — ABNORMAL HIGH (ref 0.0–0.2)

## 2021-12-13 LAB — BASIC METABOLIC PANEL
Anion gap: 9 (ref 5–15)
BUN: 34 mg/dL — ABNORMAL HIGH (ref 8–23)
CO2: 24 mmol/L (ref 22–32)
Calcium: 8.8 mg/dL — ABNORMAL LOW (ref 8.9–10.3)
Chloride: 104 mmol/L (ref 98–111)
Creatinine, Ser: 1.49 mg/dL — ABNORMAL HIGH (ref 0.44–1.00)
GFR, Estimated: 38 mL/min — ABNORMAL LOW (ref >60)
Glucose, Bld: 91 mg/dL (ref 70–99)
Potassium: 4.4 mmol/L (ref 3.5–5.1)
Sodium: 137 mmol/L (ref 135–145)

## 2021-12-13 LAB — GLUCOSE, CAPILLARY
Glucose-Capillary: 93 mg/dL (ref 70–99)
Glucose-Capillary: 111 mg/dL — ABNORMAL HIGH (ref 70–99)
Glucose-Capillary: 108 mg/dL — ABNORMAL HIGH (ref 70–99)
Glucose-Capillary: 134 mg/dL — ABNORMAL HIGH (ref 70–99)
Glucose-Capillary: 141 mg/dL — ABNORMAL HIGH (ref 70–99)

## 2021-12-13 LAB — SURGICAL PATHOLOGY

## 2021-12-13 MED ORDER — INSULIN ASPART 100 UNIT/ML IJ SOLN
0.0000 [IU] | INTRAMUSCULAR | Status: DC
  Administered 2021-12-13 (×2): 1 [IU] via SUBCUTANEOUS
  Administered 2021-12-13: 3 [IU] via SUBCUTANEOUS
  Administered 2021-12-14 – 2021-12-16 (×5): 1 [IU] via SUBCUTANEOUS

## 2021-12-13 MED ORDER — ENSURE ENLIVE PO LIQD
237.0000 mL | Freq: Two times a day (BID) | ORAL | Status: DC
  Administered 2021-12-13 – 2021-12-18 (×11): 237 mL via ORAL

## 2021-12-13 MED ORDER — TAMSULOSIN HCL 0.4 MG PO CAPS
0.4000 mg | ORAL_CAPSULE | Freq: Every day | ORAL | Status: DC
  Administered 2021-12-13 – 2021-12-18 (×6): 0.4 mg via ORAL
  Filled 2021-12-13 (×6): qty 1
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
## 2021-12-13 NOTE — Progress Notes (Signed)
Subjective: Patient reports  doing well denies any numbness tingling arms or legs minimal neck pain  Objective: Vital signs in last 24 hours: Temp:  [97.6 F (36.4 C)-98.6 F (37 C)] 97.6 F (36.4 C) (02/10 0715) Pulse Rate:  [71-89] 71 (02/10 0715) Resp:  [10-16] 10 (02/10 0715) BP: (108-115)/(59-71) 115/71 (02/10 0715) SpO2:  [98 %-99 %] 99 % (02/10 0715)  Intake/Output from previous day: 02/09 0701 - 02/10 0700 In: 54 [P.O.:460] Out: 1525 [Urine:1525] Intake/Output this shift: Total I/O In: 240 [P.O.:240] Out: 1300 [Urine:1300]  Awake and alert more alert and more interactive today strength is better 5 out of 5 upper extremities right lower extremity appears to be 4 to 4+ out of 5 effort dependent  Lab Results: Recent Labs    12/12/21 0351 12/13/21 0302  WBC 10.3 10.2  HGB 7.4* 7.1*  HCT 22.8* 21.7*  PLT 141* 158   BMET Recent Labs    12/12/21 0351 12/13/21 0302  NA 136 137  K 3.9 4.4  CL 102 104  CO2 24 24  GLUCOSE 86 91  BUN 35* 34*  CREATININE 1.72* 1.49*  CALCIUM 8.9 8.8*    Studies/Results: DG Cervical Spine 1 View  Result Date: 12/12/2021 CLINICAL DATA:  C2 fracture. EXAM: DG CERVICAL SPINE - 1 VIEW COMPARISON:  12/08/2021 FINDINGS: Fractures are again seen at junction C2 vertebral body and pedicles, which remain slightly displaced. No evidence of subluxation. IMPRESSION: Mildly displaced fractures at the junction of the C2 vertebral body and pedicles, without subluxation. Electronically Signed   By: Marlaine Hind M.D.   On: 12/12/2021 09:46   MR CERVICAL SPINE WO CONTRAST  Result Date: 12/12/2021 CLINICAL DATA:  Spinal cord injury, follow up EXAM: MRI CERVICAL SPINE WITHOUT CONTRAST TECHNIQUE: Multiplanar, multisequence MR imaging of the cervical spine was performed. No intravenous contrast was administered. COMPARISON:  CT 12/04/2021, MRA neck 12/06/2021. FINDINGS: Alignment: There is angulation of the posterior elements of C2 in association the  bilateral pars interarticularis fractures, which extends into the left base of the C2 vertebrae. There is splaying of the posterior aspect of the C2-C3 disc space, but no anterolisthesis. Normal alignment of the atlantooccipital joints and atlantoaxial joint. Vertebrae: Bilateral pars interarticularis fractures of C2 extending to the base of the C2 vertebral body on the left, with mild displacement and angulation. There is involvement of the vertebral canals bilaterally, recently evaluated with MRA of the neck. Cord: There is no abnormal cord signal. Posterior Fossa, vertebral arteries, paraspinal tissues: Negative Disc levels: There is a posterior longitudinal ligament injury at C2-C3 (sagittal stir images 6 and 8). There is slight bowing of the ligamentum flavum between C1 and C2 with adjacent edema signal posteriorly, likely reflecting ligamentous sprain (sagittal stir image 6). Just anterior to the ligamentum flavum at C1-C2, and posterior to the PLL along C2 is low signal intensity material, which is favored represent CSF flow artifact and less likely blood products as there is no correlate on gradient images. Asymmetric left posterior disc osteophyte complex at C6-C7 result in mild spinal canal stenosis. Mild left neural foraminal narrowing at C4-C5 due to disc bulging. 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 Posterior longitudinal ligament injury at C2-C3 with splaying of the posterior disc space but no anterolisthesis. Slight posterior bowing but intact ligamentum flavum between C1 and C2. Adjacent edema signal posteriorly which could represent ligamentum flavum/interspinous  sprain. Low signal intensity material just anterior to the ligamentum flavum at C1-C2 and posterior to the PLL along the C2 vertebrae, which is favored to represent CSF flow artifact and  less likely blood products given no correlate on gradient images. No evidence of spinal cord injury. Degenerative changes resulting in mild spinal canal stenosis at C6-C7 and mild left neural foraminal stenosis at C4-C5. Electronically Signed   By: Maurine Simmering M.D.   On: 12/12/2021 09:58    Assessment/Plan: Patient is got a hangman's fracture stable and serial x-rays and MRI scan also shows no cord injury cord compression.  Continue to work on finding a better collar for her otherwise continue to mobilize with physical and Occupational Therapy per trauma and Ortho  LOS: 9 days     Elaina Hoops 12/13/2021, 10:14 AM
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
## 2021-12-13 NOTE — Care Management Important Message (Signed)
Important Message  Patient Details  Name: [name] MRN: [id number] Date of Birth: 1956/03/06   Medicare Important Message Given:  Yes     Hannah Beat 12/13/2021, 11:32 AM

## 2021-12-14 DIAGNOSIS — C787 Secondary malignant neoplasm of liver and intrahepatic bile duct: Secondary | ICD-10-CM | POA: Diagnosis not present

## 2021-12-14 LAB — GLUCOSE, CAPILLARY
Glucose-Capillary: 110 mg/dL — ABNORMAL HIGH (ref 70–99)
Glucose-Capillary: 111 mg/dL — ABNORMAL HIGH (ref 70–99)
Glucose-Capillary: 111 mg/dL — ABNORMAL HIGH (ref 70–99)
Glucose-Capillary: 113 mg/dL — ABNORMAL HIGH (ref 70–99)
Glucose-Capillary: 115 mg/dL — ABNORMAL HIGH (ref 70–99)
Glucose-Capillary: 133 mg/dL — ABNORMAL HIGH (ref 70–99)
Glucose-Capillary: 202 mg/dL — ABNORMAL HIGH (ref 70–99)
Glucose-Capillary: 93 mg/dL (ref 70–99)

## 2021-12-14 LAB — BASIC METABOLIC PANEL
Anion gap: 11 (ref 5–15)
BUN: 36 mg/dL — ABNORMAL HIGH (ref 8–23)
CO2: 23 mmol/L (ref 22–32)
Calcium: 9 mg/dL (ref 8.9–10.3)
Chloride: 102 mmol/L (ref 98–111)
Creatinine, Ser: 1.46 mg/dL — ABNORMAL HIGH (ref 0.44–1.00)
GFR, Estimated: 39 mL/min — ABNORMAL LOW (ref >60)
Glucose, Bld: 101 mg/dL — ABNORMAL HIGH (ref 70–99)
Potassium: 4.5 mmol/L (ref 3.5–5.1)
Sodium: 136 mmol/L (ref 135–145)

## 2021-12-14 LAB — CBC
HCT: 22.1 % — ABNORMAL LOW (ref 36.0–46.0)
Hemoglobin: 7.2 g/dL — ABNORMAL LOW (ref 12.0–15.0)
MCH: 30.9 pg (ref 26.0–34.0)
MCHC: 32.6 g/dL (ref 30.0–36.0)
MCV: 94.8 fL (ref 80.0–100.0)
Platelets: 188 10*3/uL (ref 150–400)
RBC: 2.33 MIL/uL — ABNORMAL LOW (ref 3.87–5.11)
RDW: 17.7 % — ABNORMAL HIGH (ref 11.5–15.5)
WBC: 9.9 10*3/uL (ref 4.0–10.5)
nRBC: 0.6 % — ABNORMAL HIGH (ref 0.0–0.2)
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
## 2021-12-15 LAB — GLUCOSE, CAPILLARY
Glucose-Capillary: 101 mg/dL — ABNORMAL HIGH (ref 70–99)
Glucose-Capillary: 97 mg/dL (ref 70–99)
Glucose-Capillary: 105 mg/dL — ABNORMAL HIGH (ref 70–99)
Glucose-Capillary: 122 mg/dL — ABNORMAL HIGH (ref 70–99)
Glucose-Capillary: 140 mg/dL — ABNORMAL HIGH (ref 70–99)

## 2021-12-15 MED ORDER — HYDROMORPHONE HCL 1 MG/ML IJ SOLN
0.5000 mg | Freq: Three times a day (TID) | INTRAMUSCULAR | Status: DC | PRN
  Administered 2021-12-18: 0.5 mg via INTRAVENOUS
  Filled 2021-12-15: qty 0.5

## 2021-12-15 MED ORDER — METHOCARBAMOL 500 MG PO TABS
1000.0000 mg | ORAL_TABLET | Freq: Three times a day (TID) | ORAL | Status: DC | PRN
  Administered 2021-12-16: 1000 mg via ORAL
  Filled 2021-12-15: qty 2

## 2021-12-15 MED ORDER — OXYCODONE HCL 5 MG PO TABS
5.0000 mg | ORAL_TABLET | Freq: Four times a day (QID) | ORAL | Status: DC | PRN
  Administered 2021-12-15 – 2021-12-18 (×8): 5 mg via ORAL
  Filled 2021-12-15 (×8): qty 1

## 2021-12-15 MED ORDER — ACETAMINOPHEN 500 MG PO TABS
1000.0000 mg | ORAL_TABLET | Freq: Four times a day (QID) | ORAL | Status: DC | PRN
  Administered 2021-12-16: 1000 mg via ORAL
  Filled 2021-12-15 (×2): qty 2

## 2021-12-15 NOTE — Progress Notes (Signed)
Subjective: The patient is alert and pleasant.  Objective: Vital signs in last 24 hours: Temp:  [97.7 F (36.5 C)-99.1 F (37.3 C)] 98.9 F (37.2 C) (02/12 0720) Pulse Rate:  [73-88] 86 (02/12 0720) Resp:  [13-17] 15 (02/12 0720) BP: (111-142)/(57-78) 125/78 (02/12 0720) SpO2:  [95 %-100 %] 98 % (02/12 0720) Estimated body mass index is 38.08 kg/m as calculated from the following:   Height as of this encounter: 5\' 3"  (1.6 m).   Weight as of this encounter: 97.5 kg.   Intake/Output from previous day: 02/11 0701 - 02/12 0700 In: 720 [P.O.:720] Out: 1450 [Urine:1450] Intake/Output this shift: Total I/O In: 120 [P.O.:120] Out: -   Physical exam the patient is alert and oriented.  She moves all 4 extremities well.  Lab Results: Recent Labs    12/13/21 0302 12/14/21 0319  WBC 10.2 9.9  HGB 7.1* 7.2*  HCT 21.7* 22.1*  PLT 158 188   BMET Recent Labs    12/13/21 0302 12/14/21 0319  NA 137 136  K 4.4 4.5  CL 104 102  CO2 24 23  GLUCOSE 91 101*  BUN 34* 36*  CREATININE 1.49* 1.46*  CALCIUM 8.8* 9.0    Studies/Results: No results found.  Assessment/Plan: Hangman's fracture: The patient seems to be doing well in a hard collar.  LOS: 11 days     [name] 12/15/2021, 8:55 AM     Patient ID: [name], female   DOB: 1955/11/12, 66 y.o.   MRN: [id number]

## 2021-12-15 NOTE — Progress Notes (Signed)
Moderate amount of brownish drainage noted from abdominal  incision site ,small opening noted at lower incision site, staples remain intact. Apply dressing to site.
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
## 2021-12-16 ENCOUNTER — Inpatient Hospital Stay (HOSPITAL_COMMUNITY): Payer: Medicare Other

## 2021-12-16 ENCOUNTER — Other Ambulatory Visit: Payer: Self-pay

## 2021-12-16 DIAGNOSIS — M79605 Pain in left leg: Secondary | ICD-10-CM

## 2021-12-16 DIAGNOSIS — C189 Malignant neoplasm of colon, unspecified: Secondary | ICD-10-CM

## 2021-12-16 DIAGNOSIS — M79604 Pain in right leg: Secondary | ICD-10-CM | POA: Diagnosis not present

## 2021-12-16 LAB — GLUCOSE, CAPILLARY
Glucose-Capillary: 103 mg/dL — ABNORMAL HIGH (ref 70–99)
Glucose-Capillary: 105 mg/dL — ABNORMAL HIGH (ref 70–99)
Glucose-Capillary: 111 mg/dL — ABNORMAL HIGH (ref 70–99)
Glucose-Capillary: 119 mg/dL — ABNORMAL HIGH (ref 70–99)
Glucose-Capillary: 121 mg/dL — ABNORMAL HIGH (ref 70–99)
Glucose-Capillary: 124 mg/dL — ABNORMAL HIGH (ref 70–99)
Glucose-Capillary: 95 mg/dL (ref 70–99)

## 2021-12-16 MED ORDER — METHOCARBAMOL 500 MG PO TABS
1000.0000 mg | ORAL_TABLET | Freq: Three times a day (TID) | ORAL | Status: DC
  Administered 2021-12-16 – 2021-12-18 (×7): 1000 mg via ORAL
  Filled 2021-12-16 (×7): qty 2

## 2021-12-16 MED ORDER — ACETAMINOPHEN 500 MG PO TABS
1000.0000 mg | ORAL_TABLET | Freq: Four times a day (QID) | ORAL | Status: DC
  Administered 2021-12-16 – 2021-12-18 (×8): 1000 mg via ORAL
  Filled 2021-12-16 (×9): qty 2

## 2021-12-16 NOTE — Progress Notes (Signed)
Brief Oncology Note:  I stopped to visit with the patient today. She was sleeping. I did not awaken her because review of chart indicates that she was having pain earlier today. No family at the bedside.  Chart reviewed. CEA mildly elevated on 12/11/2021 at 5.8. Mismatch repair protein normal. [facility] testing is still pending. GI has seen the patient and plans for outpatient colonoscopy in 6-8 weeks. Awaiting SNF placement for rehab.   We will plan for a PET scan as an outpatient once she is stable enough to proceed and will also discuss her case in GI tumor conference. We will have to wait until she recovers well enough to consider systemic chemotherapy. Will arrange outpatient follow up when she is able to come to the office. For now, I will continue to follow the chart peripherally.   Mikey Bussing, DNP, AGPCNP-BC, AOCNP .
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
## 2021-12-16 NOTE — Progress Notes (Signed)
Lower extremity venous has been completed.   Preliminary results in CV Proc.   Tejal Monroy Shaquil Aldana 12/16/2021 2:10 PM
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
## 2021-12-16 NOTE — TOC Progression Note (Signed)
Transition of Care [facility]) - Progression Note    Patient Details  Name: [name] MRN: [id number] Date of Birth: 1956-02-13  Transition [facility]) CM/SW Contact  Ella Bodo, RN Phone Number: 12/16/2021, 2:58 PM  Clinical Narrative:    SNF bed offers given to patient's son, [name].  He states he will have a choice made first thing in the morning, after discussing with family.  Will follow up with son in AM.    Expected Discharge Plan: [place] Barriers to Discharge: Continued Medical Work up  Expected Discharge Plan and Services Expected Discharge Plan: [place]   Discharge Planning Services: CM Consult Post Acute Care Choice: [place] Living arrangements for the past 2 months: Single Family Home                                       Social Determinants of Health (SDOH) Interventions    Readmission Risk Interventions No flowsheet data found.  [name], RN, BSN  Trauma/Neuro ICU Case Manager [phone]

## 2021-12-17 LAB — GLUCOSE, CAPILLARY
Glucose-Capillary: 104 mg/dL — ABNORMAL HIGH (ref 70–99)
Glucose-Capillary: 84 mg/dL (ref 70–99)
Glucose-Capillary: 85 mg/dL (ref 70–99)
Glucose-Capillary: 94 mg/dL (ref 70–99)
Glucose-Capillary: 94 mg/dL (ref 70–99)
Glucose-Capillary: 99 mg/dL (ref 70–99)
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
## 2021-12-17 NOTE — TOC Progression Note (Addendum)
Transition of Care [facility]) - Progression Note    Patient Details  Name: [name] MRN: [id number] Date of Birth: [birth year]  Transition of Care [facility]) CM/SW Contact  [name] [name], RN Phone Number: 12/17/2021, 9:36 AM  Clinical Narrative:    Spoke with patient's son, [name], regarding bed offers for SNF: pt/family prefers [facility] in [place].  Left message for [name] in Admissions at [facility]; will initiate insurance auth once bed offer is confirmed.   Addendum: 1208pm Left additional message for [name] in admissions at [facility], requesting call back. She did return my call; bed will be available today or tomorrow, pending insurance authorization.   Addendum: [employer] authorization received from [facility], for admission to [facility] (now [facility] [place] ID [id number]; authorization from 12/18/21 to 12/20/21. Notified [name] in admissions at facility; plan discharge to facility around lunchtime or early afternoon on 12/18/2021.   Spoke with patient's son, [name] and updated him on events; he states he will be in a class on Wednesday, and asks that we call patient's sister, [name], with updates.  I did speak with pt's sister and notified her of planned dc for tomorrow. No Covid test required by facility.   Expected Discharge Plan: [place] Barriers to Discharge: Continued Medical Work up  Expected Discharge Plan and Services Expected Discharge Plan: [place]   Discharge Planning Services: CM Consult Post Acute Care Choice: [place] Living arrangements for the past 2 months: Single Family Home                                       Social Determinants of Health (SDOH) Interventions    Readmission Risk Interventions No flowsheet data found.  [name], RN, BSN  Trauma/Neuro ICU Case Manager [phone]

## 2021-12-18 ENCOUNTER — Encounter (HOSPITAL_COMMUNITY): Payer: Self-pay

## 2021-12-18 LAB — GLUCOSE, CAPILLARY
Glucose-Capillary: 100 mg/dL — ABNORMAL HIGH (ref 70–99)
Glucose-Capillary: 95 mg/dL (ref 70–99)
Glucose-Capillary: 107 mg/dL — ABNORMAL HIGH (ref 70–99)

## 2021-12-18 MED ORDER — TAMSULOSIN HCL 0.4 MG PO CAPS: 0.4000 mg | ORAL_CAPSULE | Freq: Every day | ORAL | Status: DC

## 2021-12-18 MED ORDER — ENSURE ENLIVE PO LIQD: 237.0000 mL | Freq: Two times a day (BID) | ORAL | 12 refills | Status: DC

## 2021-12-18 MED ORDER — POLYETHYLENE GLYCOL 3350 17 G PO PACK: 17.0000 g | PACK | Freq: Every day | ORAL | 0 refills | Status: DC | PRN

## 2021-12-18 MED ORDER — METHOCARBAMOL 1000 MG PO TABS: 1000.0000 mg | ORAL_TABLET | Freq: Three times a day (TID) | ORAL | Status: DC

## 2021-12-18 MED ORDER — HEPARIN SODIUM (PORCINE) 5000 UNIT/ML IJ SOLN: 5000.0000 [IU] | Freq: Three times a day (TID) | INTRAMUSCULAR | Status: DC

## 2021-12-18 MED ORDER — ACETAMINOPHEN 500 MG PO TABS: 1000.0000 mg | ORAL_TABLET | Freq: Four times a day (QID) | ORAL | 0 refills | Status: DC

## 2021-12-18 MED ORDER — OXYCODONE HCL 5 MG PO TABS: 5.0000 mg | ORAL_TABLET | Freq: Four times a day (QID) | ORAL | 0 refills | Status: DC | PRN

## 2021-12-18 MED ORDER — POLYETHYLENE GLYCOL 3350 17 G PO PACK: 17.0000 g | PACK | Freq: Every day | ORAL | Status: DC | PRN

## 2021-12-18 MED ORDER — DOCUSATE SODIUM 100 MG PO CAPS: 100.0000 mg | ORAL_CAPSULE | Freq: Two times a day (BID) | ORAL | 0 refills | Status: DC

## 2021-12-18 NOTE — Consult Note (Signed)
WOC ordered for NPWT dressing placement. Noted patient is for DC today, confirmed with CM patient will have different type of NPWT device at the SNF will not place 20M/KCI. Placement will occur at SNF.  CCS and CM aware.   Primary bedside nurse aware.  Will sign off [place], [place], [place]

## 2021-12-18 NOTE — TOC Transition Note (Addendum)
Transition of Care [facility]) - CM/SW Discharge Note   Patient Details  Name: [name] MRN: [id number] Date of Birth: 1956-08-13  Transition of Care [facility]) CM/SW Contact:  Ella Bodo, RN Phone Number: 12/18/2021, 12:22 PM   Clinical Narrative:    Pt medically stable for discharge to SNF today; bed confirmed at [facility]now [facility]) for admission today.  Patient discharging to room A-2, bed 1; bedside nurse to call report to [phone].  Discharge summary and SNF transfer report forwarded to [facility] in admissions. Facility to obtain wound VAC, and will be available today; they ask to send patient with wet to dry dressing and they will apply wound VAC when she arrives. PTAR notified for transport at 12:25pm. Patient's sister, [name] wishes to be contacted when [facility] arrives to transport. Phone # is [phone].   Final next level of care: Skilled Nursing Facility Barriers to Discharge: Barriers Resolved   Patient Goals and CMS Choice Patient states their goals for this hospitalization and ongoing recovery are:: to go home CMS Medicare.gov Compare Post Acute Care list provided to:: Patient Represenative (must comment) (son) Choice offered to / list presented to : Patient, Adult Children  Discharge Placement PASRR number recieved: 12/13/21            Patient chooses bed at: Other - please specify in the comment section below: [facility]) Patient to be transferred to facility by: [place] Name of family member notified: [name] , sister Patient and family notified of of transfer: 12/18/21  Discharge Plan and Services   Discharge Planning Services: CM Consult Post Acute Care Choice: [place]                               Social Determinants of Health (SDOH) Interventions     Readmission Risk Interventions No flowsheet data found.  [name], RN, BSN  Trauma/Neuro ICU Case Manager [phone]

## 2021-12-18 NOTE — Progress Notes (Signed)
Oncology Discharge Planning Admission Note  [facility] Address: [address], [place], [place] [address] Hours of Operation:  8am - 5pm, Monday - Friday  Clinic Contact Information:  [phone]) [phone]  Oncology Care Team: Medical Oncologist:    Myrtha Mantis, NP is aware of this hospital admission dated 12/04/21, and the cancer center will follow [place] inpatient care to assist with discharge planning as indicated by the oncologist.  We will arrange for outpatient follow up closer to discharge.  Disclaimer:  This [place] note does not imply a formal consult request has been made by the admitting attending for this admission or there will be an inpatient consult completed by oncology.  Please request oncology consults as per standard process as indicated.
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
## 2021-12-18 NOTE — Discharge Instructions (Signed)
[facility]      [facility] [phone]  OPEN ABDOMINAL SURGERY: POST OP INSTRUCTIONS  Always review your discharge instruction sheet given to you by the facility where your surgery was performed.  IF YOU HAVE DISABILITY OR FAMILY LEAVE FORMS, YOU MUST BRING THEM TO THE OFFICE FOR PROCESSING.  PLEASE DO NOT GIVE THEM TO YOUR DOCTOR.  A prescription for pain medication may be given to you upon discharge.  Take your pain medication as prescribed, if needed.  If narcotic pain medicine is not needed, then you may take acetaminophen (Tylenol) or ibuprofen (Advil) as needed. Take your usually prescribed medications unless otherwise directed. If you need a refill on your pain medication, please contact your pharmacy. They will contact our office to request authorization.  Prescriptions will not be filled after 5pm or on week-ends. You should follow a light diet the first few days after arrival home, such as soup and crackers, pudding, etc.unless your doctor has advised otherwise. A high-fiber, low fat diet can be resumed as tolerated.   Be sure to include lots of fluids daily. Most patients will experience some swelling and bruising on the chest and neck area.  Ice packs will help.  Swelling and bruising can take several days to resolve Most patients will experience some swelling and bruising in the area of the incision. Ice pack will help. Swelling and bruising can take several days to resolve..  It is common to experience some constipation if taking pain medication after surgery.  Increasing fluid intake and taking a stool softener will usually help or prevent this problem from occurring.  A mild laxative (Milk of Magnesia or Miralax) should be taken according to package directions if there are no bowel movements after 48 hours.  ACTIVITIES:  You may resume regular (light) daily activities beginning the next day--such as daily self-care, walking, climbing stairs--gradually increasing activities  as tolerated.  You may have sexual intercourse when it is comfortable.  Refrain from any heavy lifting or straining until approved by your doctor. You may drive when you no longer are taking prescription pain medication, you can comfortably wear a seatbelt, and you can safely maneuver your car and apply brakes You should see your doctor in the office for a follow-up appointment approximately two weeks after your surgery.  Make sure that you call for this appointment within a day or two after you arrive home to insure a convenient appointment time.  WHEN TO CALL YOUR DOCTOR: Fever over 101.0 Inability to urinate Nausea and/or vomiting Extreme swelling or bruising Continued bleeding from incision. Increased pain, redness, or drainage from the incision. Difficulty swallowing or breathing Muscle cramping or spasms. Numbness or tingling in hands or feet or around lips.  The clinic staff is available to answer your questions during regular business hours.  Please don't hesitate to call and ask to speak to one of the nurses if you have concerns.  For further questions, please visit [email or web address]   

## 2021-12-20 ENCOUNTER — Encounter (HOSPITAL_COMMUNITY): Payer: Self-pay | Admitting: Hematology

## 2021-12-23 NOTE — Progress Notes (Signed)
I spoke with Ms Alcala's sister Myrtis Ser.  I reviewed Ms Derrington's upcoming appt with Dr Burr Medico on 01/17/2022.  I let her know that I will contact [facility] to arrange transportation for this appt closer to the time of the appt.  I explained my role as a nurse navigator and provided my contact information.  All questions were answered.  She verbalized understanding.  She states she will relay this information to her sister and her nephew.

## 2021-12-24 ENCOUNTER — Encounter (HOSPITAL_COMMUNITY): Payer: Self-pay | Admitting: *Deleted

## 2021-12-24 NOTE — Progress Notes (Signed)
[facility] was contacted by telephone to verify understanding of discharge instructions status post their most recent discharge from the hospital on the date:  12/18/21.  Inpatient discharge AVS was re-reviewed with Fonda at facility, along with cancer center appointments.  Verification of understanding for oncology specific follow-up was validated using the Teach Back method.    Transportation to appointments were confirmed for the patient as being  provided by facility .

## 2021-12-25 ENCOUNTER — Other Ambulatory Visit: Payer: Self-pay

## 2021-12-25 NOTE — Progress Notes (Signed)
The proposed treatment discussed in conference is for discussion purpose only and is not a binding recommendation.  The patients have not been physically examined, or presented with their treatment options.  Therefore, final treatment plans cannot be decided.  

## 2021-12-27 ENCOUNTER — Inpatient Hospital Stay (HOSPITAL_COMMUNITY)
Admission: EM | Admit: 2021-12-27 | Discharge: 2021-12-31 | DRG: 683 | Disposition: A | Discharge status: Skilled Nursing Facility | Payer: Medicare Other | Attending: Internal Medicine | Admitting: Internal Medicine

## 2021-12-27 ENCOUNTER — Emergency Department (HOSPITAL_COMMUNITY): Payer: Medicare Other

## 2021-12-27 ENCOUNTER — Encounter (HOSPITAL_COMMUNITY): Payer: Self-pay | Admitting: Hematology

## 2021-12-27 ENCOUNTER — Other Ambulatory Visit: Payer: Self-pay

## 2021-12-27 DIAGNOSIS — N390 Urinary tract infection, site not specified: Secondary | ICD-10-CM | POA: Diagnosis present

## 2021-12-27 DIAGNOSIS — Z905 Acquired absence of kidney: Secondary | ICD-10-CM

## 2021-12-27 DIAGNOSIS — L89610 Pressure ulcer of right heel, unstageable: Secondary | ICD-10-CM | POA: Diagnosis present

## 2021-12-27 DIAGNOSIS — S12100A Unspecified displaced fracture of second cervical vertebra, initial encounter for closed fracture: Secondary | ICD-10-CM | POA: Diagnosis present

## 2021-12-27 DIAGNOSIS — Z7982 Long term (current) use of aspirin: Secondary | ICD-10-CM

## 2021-12-27 DIAGNOSIS — I959 Hypotension, unspecified: Secondary | ICD-10-CM | POA: Diagnosis present

## 2021-12-27 DIAGNOSIS — I9589 Other hypotension: Secondary | ICD-10-CM | POA: Diagnosis not present

## 2021-12-27 DIAGNOSIS — Z6837 Body mass index (BMI) 37.0-37.9, adult: Secondary | ICD-10-CM | POA: Diagnosis not present

## 2021-12-27 DIAGNOSIS — C189 Malignant neoplasm of colon, unspecified: Secondary | ICD-10-CM | POA: Diagnosis present

## 2021-12-27 DIAGNOSIS — E785 Hyperlipidemia, unspecified: Secondary | ICD-10-CM | POA: Diagnosis present

## 2021-12-27 DIAGNOSIS — D649 Anemia, unspecified: Secondary | ICD-10-CM | POA: Diagnosis not present

## 2021-12-27 DIAGNOSIS — Y92410 Unspecified street and highway as the place of occurrence of the external cause: Secondary | ICD-10-CM

## 2021-12-27 DIAGNOSIS — E669 Obesity, unspecified: Secondary | ICD-10-CM | POA: Diagnosis present

## 2021-12-27 DIAGNOSIS — E861 Hypovolemia: Secondary | ICD-10-CM | POA: Diagnosis not present

## 2021-12-27 DIAGNOSIS — C787 Secondary malignant neoplasm of liver and intrahepatic bile duct: Secondary | ICD-10-CM | POA: Diagnosis present

## 2021-12-27 DIAGNOSIS — D630 Anemia in neoplastic disease: Secondary | ICD-10-CM | POA: Diagnosis present

## 2021-12-27 DIAGNOSIS — I1310 Hypertensive heart and chronic kidney disease without heart failure, with stage 1 through stage 4 chronic kidney disease, or unspecified chronic kidney disease: Secondary | ICD-10-CM | POA: Diagnosis present

## 2021-12-27 DIAGNOSIS — Z9851 Tubal ligation status: Secondary | ICD-10-CM | POA: Diagnosis not present

## 2021-12-27 DIAGNOSIS — N179 Acute kidney failure, unspecified: Principal | ICD-10-CM | POA: Diagnosis present

## 2021-12-27 DIAGNOSIS — Z8249 Family history of ischemic heart disease and other diseases of the circulatory system: Secondary | ICD-10-CM | POA: Diagnosis not present

## 2021-12-27 DIAGNOSIS — D72829 Elevated white blood cell count, unspecified: Secondary | ICD-10-CM | POA: Diagnosis present

## 2021-12-27 DIAGNOSIS — N1832 Chronic kidney disease, stage 3b: Secondary | ICD-10-CM | POA: Diagnosis present

## 2021-12-27 DIAGNOSIS — Z20822 Contact with and (suspected) exposure to covid-19: Secondary | ICD-10-CM | POA: Diagnosis present

## 2021-12-27 DIAGNOSIS — Z79899 Other long term (current) drug therapy: Secondary | ICD-10-CM

## 2021-12-27 DIAGNOSIS — Z87442 Personal history of urinary calculi: Secondary | ICD-10-CM

## 2021-12-27 DIAGNOSIS — Z8673 Personal history of transient ischemic attack (TIA), and cerebral infarction without residual deficits: Secondary | ICD-10-CM | POA: Diagnosis not present

## 2021-12-27 DIAGNOSIS — Z9049 Acquired absence of other specified parts of digestive tract: Secondary | ICD-10-CM | POA: Diagnosis not present

## 2021-12-27 DIAGNOSIS — Z85528 Personal history of other malignant neoplasm of kidney: Secondary | ICD-10-CM

## 2021-12-27 DIAGNOSIS — N189 Chronic kidney disease, unspecified: Secondary | ICD-10-CM | POA: Diagnosis present

## 2021-12-27 LAB — PROTIME-INR
INR: 1.1 (ref 0.8–1.2)
Prothrombin Time: 14.2 seconds (ref 11.4–15.2)

## 2021-12-27 LAB — COMPREHENSIVE METABOLIC PANEL
ALT: 13 U/L (ref 0–44)
AST: 16 U/L (ref 15–41)
Albumin: 2.5 g/dL — ABNORMAL LOW (ref 3.5–5.0)
Alkaline Phosphatase: 100 U/L (ref 38–126)
Anion gap: 13 (ref 5–15)
BUN: 59 mg/dL — ABNORMAL HIGH (ref 8–23)
CO2: 22 mmol/L (ref 22–32)
Calcium: 8.9 mg/dL (ref 8.9–10.3)
Chloride: 100 mmol/L (ref 98–111)
Creatinine, Ser: 4 mg/dL — ABNORMAL HIGH (ref 0.44–1.00)
GFR, Estimated: 12 mL/min — ABNORMAL LOW (ref >60)
Glucose, Bld: 121 mg/dL — ABNORMAL HIGH (ref 70–99)
Potassium: 4.4 mmol/L (ref 3.5–5.1)
Sodium: 135 mmol/L (ref 135–145)
Total Bilirubin: 1.2 mg/dL (ref 0.3–1.2)
Total Protein: 6 g/dL — ABNORMAL LOW (ref 6.5–8.1)

## 2021-12-27 LAB — CBC WITH DIFFERENTIAL/PLATELET
Abs Immature Granulocytes: 0.42 10*3/uL — ABNORMAL HIGH (ref 0.00–0.07)
Basophils Absolute: 0.1 10*3/uL (ref 0.0–0.1)
Basophils Relative: 1 %
Eosinophils Absolute: 0.2 10*3/uL (ref 0.0–0.5)
Eosinophils Relative: 2 %
HCT: 25.2 % — ABNORMAL LOW (ref 36.0–46.0)
Hemoglobin: 7.9 g/dL — ABNORMAL LOW (ref 12.0–15.0)
Immature Granulocytes: 4 %
Lymphocytes Relative: 19 %
Lymphs Abs: 2.1 10*3/uL (ref 0.7–4.0)
MCH: 31.5 pg (ref 26.0–34.0)
MCHC: 31.3 g/dL (ref 30.0–36.0)
MCV: 100.4 fL — ABNORMAL HIGH (ref 80.0–100.0)
Monocytes Absolute: 1.1 10*3/uL — ABNORMAL HIGH (ref 0.1–1.0)
Monocytes Relative: 10 %
Neutro Abs: 7.1 10*3/uL (ref 1.7–7.7)
Neutrophils Relative %: 64 %
Platelets: 255 10*3/uL (ref 150–400)
RBC: 2.51 MIL/uL — ABNORMAL LOW (ref 3.87–5.11)
RDW: 20.3 % — ABNORMAL HIGH (ref 11.5–15.5)
WBC: 11 10*3/uL — ABNORMAL HIGH (ref 4.0–10.5)
nRBC: 0.5 % — ABNORMAL HIGH (ref 0.0–0.2)

## 2021-12-27 LAB — APTT: aPTT: 26 seconds (ref 24–36)

## 2021-12-27 LAB — LACTIC ACID, PLASMA: Lactic Acid, Venous: 0.7 mmol/L (ref 0.5–1.9)

## 2021-12-27 MED ORDER — ACETAMINOPHEN 325 MG PO TABS
650.0000 mg | ORAL_TABLET | Freq: Four times a day (QID) | ORAL | Status: DC | PRN
  Administered 2021-12-28 – 2021-12-31 (×7): 650 mg via ORAL
  Filled 2021-12-27 (×7): qty 2

## 2021-12-27 MED ORDER — LACTATED RINGERS IV SOLN: INTRAVENOUS | Status: AC

## 2021-12-27 MED ORDER — ONDANSETRON HCL 4 MG/2ML IJ SOLN
4.0000 mg | Freq: Four times a day (QID) | INTRAMUSCULAR | Status: DC | PRN
Start: 2021-12-27 — End: 2021-12-31

## 2021-12-27 MED ORDER — ONDANSETRON HCL 4 MG PO TABS
4.0000 mg | ORAL_TABLET | Freq: Four times a day (QID) | ORAL | Status: DC | PRN
Start: 2021-12-27 — End: 2021-12-31

## 2021-12-27 MED ORDER — HEPARIN SODIUM (PORCINE) 5000 UNIT/ML IJ SOLN
5000.0000 [IU] | Freq: Three times a day (TID) | INTRAMUSCULAR | Status: DC
  Administered 2021-12-28 – 2021-12-31 (×10): 5000 [IU] via SUBCUTANEOUS
  Filled 2021-12-27 (×10): qty 1

## 2021-12-27 MED ORDER — LACTATED RINGERS IV SOLN: INTRAVENOUS | Status: DC

## 2021-12-27 MED ORDER — ACETAMINOPHEN 650 MG RE SUPP: 650.0000 mg | Freq: Four times a day (QID) | RECTAL | Status: DC | PRN

## 2021-12-27 MED ORDER — ALBUTEROL SULFATE (2.5 MG/3ML) 0.083% IN NEBU: 2.5000 mg | INHALATION_SOLUTION | RESPIRATORY_TRACT | Status: DC | PRN

## 2021-12-27 MED ORDER — LACTATED RINGERS IV BOLUS (SEPSIS)
2000.0000 mL | Freq: Once | INTRAVENOUS | Status: AC
  Administered 2021-12-27: 2000 mL via INTRAVENOUS

## 2021-12-27 NOTE — Assessment & Plan Note (Addendum)
Resolved.  Secondary to UTI and poor p.o. intake.  Holding antihypertensives
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
## 2021-12-27 NOTE — Assessment & Plan Note (Addendum)
Creatinine improving, down from peak of 4 to to baseline as of 2/27.
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
## 2021-12-27 NOTE — Assessment & Plan Note (Addendum)
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
## 2021-12-27 NOTE — Assessment & Plan Note (Signed)
Pt is being followed by oncology. Not on chemotherapy or radiation therapy at this time
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
## 2021-12-27 NOTE — Assessment & Plan Note (Addendum)
Secondary to urinary tract infection.  Resolved with fluids and antibiotics

## 2021-12-27 NOTE — Assessment & Plan Note (Addendum)
No evidence of acute blood loss.  Hemoglobin now stable around 8.3.  Status post transfusion 1 unit packed red blood cells.  Has plans for follow-up with outpatient GI for colonoscopy

## 2021-12-28 ENCOUNTER — Encounter (HOSPITAL_COMMUNITY): Payer: Self-pay | Admitting: Family Medicine

## 2021-12-28 DIAGNOSIS — E861 Hypovolemia: Secondary | ICD-10-CM

## 2021-12-28 DIAGNOSIS — N390 Urinary tract infection, site not specified: Secondary | ICD-10-CM

## 2021-12-28 DIAGNOSIS — I9589 Other hypotension: Secondary | ICD-10-CM

## 2021-12-28 DIAGNOSIS — E669 Obesity, unspecified: Secondary | ICD-10-CM | POA: Diagnosis present

## 2021-12-28 LAB — RESP PANEL BY RT-PCR (FLU A&B, COVID) ARPGX2
Influenza A by PCR: NEGATIVE
Influenza B by PCR: NEGATIVE
SARS Coronavirus 2 by RT PCR: NEGATIVE

## 2021-12-28 LAB — CBC
HCT: 22.6 % — ABNORMAL LOW (ref 36.0–46.0)
Hemoglobin: 7 g/dL — ABNORMAL LOW (ref 12.0–15.0)
MCH: 31.3 pg (ref 26.0–34.0)
MCHC: 31 g/dL (ref 30.0–36.0)
MCV: 100.9 fL — ABNORMAL HIGH (ref 80.0–100.0)
Platelets: 221 10*3/uL (ref 150–400)
RBC: 2.24 MIL/uL — ABNORMAL LOW (ref 3.87–5.11)
RDW: 20.2 % — ABNORMAL HIGH (ref 11.5–15.5)
WBC: 7.7 10*3/uL (ref 4.0–10.5)
nRBC: 0.4 % — ABNORMAL HIGH (ref 0.0–0.2)

## 2021-12-28 LAB — HEMOGLOBIN AND HEMATOCRIT, BLOOD
HCT: 24.8 % — ABNORMAL LOW (ref 36.0–46.0)
Hemoglobin: 8.2 g/dL — ABNORMAL LOW (ref 12.0–15.0)

## 2021-12-28 LAB — URINALYSIS, ROUTINE W REFLEX MICROSCOPIC
Glucose, UA: NEGATIVE mg/dL
Ketones, ur: 5 mg/dL — AB
Nitrite: NEGATIVE
Protein, ur: 100 mg/dL — AB
Specific Gravity, Urine: 1.017 (ref 1.005–1.030)
WBC, UA: >50 WBC/hpf — ABNORMAL HIGH (ref 0–5)
pH: 5 (ref 5.0–8.0)

## 2021-12-28 LAB — BASIC METABOLIC PANEL
Anion gap: 12 (ref 5–15)
BUN: 53 mg/dL — ABNORMAL HIGH (ref 8–23)
CO2: 22 mmol/L (ref 22–32)
Calcium: 8.6 mg/dL — ABNORMAL LOW (ref 8.9–10.3)
Chloride: 101 mmol/L (ref 98–111)
Creatinine, Ser: 3.4 mg/dL — ABNORMAL HIGH (ref 0.44–1.00)
GFR, Estimated: 14 mL/min — ABNORMAL LOW (ref >60)
Glucose, Bld: 115 mg/dL — ABNORMAL HIGH (ref 70–99)
Potassium: 4 mmol/L (ref 3.5–5.1)
Sodium: 135 mmol/L (ref 135–145)

## 2021-12-28 LAB — BRAIN NATRIURETIC PEPTIDE: B Natriuretic Peptide: 13.4 pg/mL (ref 0.0–100.0)

## 2021-12-28 LAB — PREPARE RBC (CROSSMATCH)

## 2021-12-28 MED ORDER — TRAMADOL HCL 50 MG PO TABS
50.0000 mg | ORAL_TABLET | Freq: Four times a day (QID) | ORAL | Status: DC | PRN
  Administered 2021-12-28 – 2021-12-31 (×8): 50 mg via ORAL
  Filled 2021-12-28 (×9): qty 1

## 2021-12-28 MED ORDER — SODIUM CHLORIDE 0.9% IV SOLUTION: Freq: Once | INTRAVENOUS | Status: DC

## 2021-12-28 MED ORDER — SODIUM CHLORIDE 0.9 % IV SOLN
1.0000 g | INTRAVENOUS | Status: DC
Start: 2021-12-28 — End: 2021-12-30
  Administered 2021-12-28 – 2021-12-30 (×3): 1 g via INTRAVENOUS
  Filled 2021-12-28 (×3): qty 10

## 2021-12-28 MED ORDER — LACTATED RINGERS IV BOLUS
1000.0000 mL | Freq: Once | INTRAVENOUS | Status: AC
  Administered 2021-12-28: 1000 mL via INTRAVENOUS
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
## 2021-12-28 NOTE — Assessment & Plan Note (Signed)
Meets criteria with BMI > 30
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
## 2021-12-28 NOTE — ED Notes (Signed)
Blood consent obtained and signed by daughter-in -law at patients request/.

## 2021-12-28 NOTE — ED Notes (Signed)
Breakfast Order Placed ?

## 2021-12-28 NOTE — Assessment & Plan Note (Addendum)
Cultures positive for Serratia marcescens and 80,000 colonies of Enterococcus.  Sensitivities to Levaquin.

## 2021-12-29 DIAGNOSIS — D649 Anemia, unspecified: Secondary | ICD-10-CM

## 2021-12-29 LAB — BLOOD CULTURE ID PANEL (REFLEXED) - BCID2

## 2021-12-29 LAB — TYPE AND SCREEN
ABO/RH(D): A POS
Antibody Screen: NEGATIVE
Unit division: 0

## 2021-12-29 LAB — BPAM RBC
Blood Product Expiration Date: 202303102359
ISSUE DATE / TIME: 202302251228
Unit Type and Rh: 6200

## 2021-12-29 LAB — CBC
HCT: 27 % — ABNORMAL LOW (ref 36.0–46.0)
Hemoglobin: 8.3 g/dL — ABNORMAL LOW (ref 12.0–15.0)
MCH: 29.5 pg (ref 26.0–34.0)
MCHC: 30.7 g/dL (ref 30.0–36.0)
MCV: 96.1 fL (ref 80.0–100.0)
Platelets: 222 10*3/uL (ref 150–400)
RBC: 2.81 MIL/uL — ABNORMAL LOW (ref 3.87–5.11)
RDW: 21 % — ABNORMAL HIGH (ref 11.5–15.5)
WBC: 7 10*3/uL (ref 4.0–10.5)
nRBC: 0.4 % — ABNORMAL HIGH (ref 0.0–0.2)

## 2021-12-29 LAB — BASIC METABOLIC PANEL
Anion gap: 10 (ref 5–15)
BUN: 38 mg/dL — ABNORMAL HIGH (ref 8–23)
CO2: 24 mmol/L (ref 22–32)
Calcium: 9 mg/dL (ref 8.9–10.3)
Chloride: 104 mmol/L (ref 98–111)
Creatinine, Ser: 2.11 mg/dL — ABNORMAL HIGH (ref 0.44–1.00)
GFR, Estimated: 25 mL/min — ABNORMAL LOW (ref >60)
Glucose, Bld: 123 mg/dL — ABNORMAL HIGH (ref 70–99)
Potassium: 4 mmol/L (ref 3.5–5.1)
Sodium: 138 mmol/L (ref 135–145)

## 2021-12-29 NOTE — Progress Notes (Signed)
PHARMACY - PHYSICIAN COMMUNICATION CRITICAL VALUE ALERT - BLOOD CULTURE IDENTIFICATION (BCID)  [name] is an 66 y.o. female who presented to [facility] on 12/27/2021 with a chief complaint of leukocytosis, AKI, and UTI.  Assessment: 66 yo female presents due to poor oral intake after being found hypotensive. The patient was found to have an AKI, UTI, and leukocytosis. BCID identifies blood cultures with MRSE 1/2 bottles (no other sets drawn).  Name of physician (or Provider) Contacted: Dr. Maryland Pink  Current antibiotics: Ceftriaxone IV 1 g Q24H  Changes to prescribed antibiotics recommended: Continue ceftriaxone for UTI -- MRSE in 1/2 bottles is likely a contaminant so the provider will not order a vancomycin per pharmacy consult at this time.   Results for orders placed or performed during the hospital encounter of 12/27/21  Blood Culture ID Panel (Reflexed) (Collected: 12/27/2021  9:05 PM)  Result Value Ref Range   Enterococcus faecalis NOT DETECTED NOT DETECTED   Enterococcus Faecium NOT DETECTED NOT DETECTED   Listeria monocytogenes NOT DETECTED NOT DETECTED   Staphylococcus species DETECTED (A) NOT DETECTED   Staphylococcus aureus (BCID) NOT DETECTED NOT DETECTED   Staphylococcus epidermidis DETECTED (A) NOT DETECTED   Staphylococcus lugdunensis NOT DETECTED NOT DETECTED   Streptococcus species NOT DETECTED NOT DETECTED   Streptococcus agalactiae NOT DETECTED NOT DETECTED   Streptococcus pneumoniae NOT DETECTED NOT DETECTED   Streptococcus pyogenes NOT DETECTED NOT DETECTED   A.calcoaceticus-baumannii NOT DETECTED NOT DETECTED   Bacteroides fragilis NOT DETECTED NOT DETECTED   Enterobacterales NOT DETECTED NOT DETECTED   Enterobacter cloacae complex NOT DETECTED NOT DETECTED   Escherichia coli NOT DETECTED NOT DETECTED   Klebsiella aerogenes NOT DETECTED NOT DETECTED   Klebsiella oxytoca NOT DETECTED NOT DETECTED   Klebsiella pneumoniae NOT DETECTED NOT DETECTED    Proteus species NOT DETECTED NOT DETECTED   Salmonella species NOT DETECTED NOT DETECTED   Serratia marcescens NOT DETECTED NOT DETECTED   Haemophilus influenzae NOT DETECTED NOT DETECTED   Neisseria meningitidis NOT DETECTED NOT DETECTED   Pseudomonas aeruginosa NOT DETECTED NOT DETECTED   Stenotrophomonas maltophilia NOT DETECTED NOT DETECTED   Candida albicans NOT DETECTED NOT DETECTED   Candida auris NOT DETECTED NOT DETECTED   Candida glabrata NOT DETECTED NOT DETECTED   Candida krusei NOT DETECTED NOT DETECTED   Candida parapsilosis NOT DETECTED NOT DETECTED   Candida tropicalis NOT DETECTED NOT DETECTED   Cryptococcus neoformans/gattii NOT DETECTED NOT DETECTED   Methicillin resistance mecA/C DETECTED (A) NOT DETECTED    Shauna Hugh, PharmD, [place]  PGY-2 Pharmacy Resident 12/29/2021 7:54 AM  Please check [email or web address] for unit-specific pharmacy phone numbers.
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
## 2021-12-30 DIAGNOSIS — E669 Obesity, unspecified: Secondary | ICD-10-CM

## 2021-12-30 LAB — URINE CULTURE: Culture: >=100000 — AB

## 2021-12-30 LAB — RESP PANEL BY RT-PCR (FLU A&B, COVID) ARPGX2
Influenza A by PCR: NEGATIVE
Influenza B by PCR: NEGATIVE
SARS Coronavirus 2 by RT PCR: NEGATIVE

## 2021-12-30 LAB — BASIC METABOLIC PANEL WITH GFR
Anion gap: 9 (ref 5–15)
BUN: 30 mg/dL — ABNORMAL HIGH (ref 8–23)
CO2: 25 mmol/L (ref 22–32)
Calcium: 9.1 mg/dL (ref 8.9–10.3)
Chloride: 104 mmol/L (ref 98–111)
Creatinine, Ser: 1.78 mg/dL — ABNORMAL HIGH (ref 0.44–1.00)
GFR, Estimated: 31 mL/min — ABNORMAL LOW (ref >60)
Glucose, Bld: 93 mg/dL (ref 70–99)
Potassium: 4.3 mmol/L (ref 3.5–5.1)
Sodium: 138 mmol/L (ref 135–145)

## 2021-12-30 LAB — CBC
HCT: 27.1 % — ABNORMAL LOW (ref 36.0–46.0)
Hemoglobin: 8.5 g/dL — ABNORMAL LOW (ref 12.0–15.0)
MCH: 29.9 pg (ref 26.0–34.0)
MCHC: 31.4 g/dL (ref 30.0–36.0)
MCV: 95.4 fL (ref 80.0–100.0)
Platelets: 208 K/uL (ref 150–400)
RBC: 2.84 MIL/uL — ABNORMAL LOW (ref 3.87–5.11)
RDW: 20.3 % — ABNORMAL HIGH (ref 11.5–15.5)
WBC: 6 K/uL (ref 4.0–10.5)
nRBC: 0.7 % — ABNORMAL HIGH (ref 0.0–0.2)

## 2021-12-30 MED ORDER — LEVOFLOXACIN 500 MG PO TABS
250.0000 mg | ORAL_TABLET | Freq: Every day | ORAL | Status: DC
  Administered 2021-12-30 – 2021-12-31 (×2): 250 mg via ORAL
  Filled 2021-12-30 (×2): qty 1

## 2021-12-30 MED ORDER — MELATONIN 3 MG PO TABS
3.0000 mg | ORAL_TABLET | Freq: Once | ORAL | Status: AC
  Administered 2021-12-30: 3 mg via ORAL
  Filled 2021-12-30: qty 1

## 2021-12-30 MED ORDER — OXYCODONE HCL 5 MG PO TABS: 5.0000 mg | ORAL_TABLET | Freq: Four times a day (QID) | ORAL | 0 refills | Status: DC | PRN

## 2021-12-30 MED ORDER — LEVOFLOXACIN 250 MG PO TABS: 250.0000 mg | ORAL_TABLET | Freq: Every day | ORAL | 0 refills | Status: DC

## 2021-12-30 MED ORDER — TIZANIDINE HCL 2 MG PO TABS: 2.0000 mg | ORAL_TABLET | Freq: Three times a day (TID) | ORAL | 1 refills | Status: DC | PRN

## 2021-12-30 NOTE — Care Management Important Message (Signed)
Important Message  Patient Details  Name: [name] MRN: [id number] Date of Birth: 14-Mar-1956   Medicare Important Message Given:  Yes     Hannah Beat 12/30/2021, 12:36 PM
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
## 2021-12-30 NOTE — Discharge Summary (Signed)
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 used as a sole basis for treatment. Nasal washings and aspirates are unacceptable for Xpert Xpress SARS-CoV-2/FLU/RSV testing.  Fact Sheet for Patients: EntrepreneurPulse.com.au  Fact Sheet for Healthcare Providers: IncredibleEmployment.be  This test is not yet approved or cleared by the [place] FDA and has been authorized for detection and/or diagnosis of SARS-CoV-2 by FDA under an Emergency Use Authorization (EUA). This  EUA will remain in effect (meaning this test can be used) for the duration of the COVID-19 declaration under Section 564(b)(1) of the Act, 21 U.S.C. section 360bbb-3(b)(1), unless the authorization is terminated or revoked.  Performed at [facility], [place] [address], [place], [place] [address]     Labs: CBC: Recent Labs  Lab 12/27/21 2105 12/28/21 0500 12/28/21 1812 12/29/21 0946 12/30/21 0211  WBC 11.0* 7.7  --  7.0 6.0  NEUTROABS 7.1  --   --   --   --   HGB 7.9* 7.0* 8.2* 8.3* 8.5*  HCT 25.2* 22.6* 24.8* 27.0* 27.1*  MCV 100.4* 100.9*  --  96.1 95.4  PLT 255 221  --  222 374   Basic Metabolic Panel: Recent Labs  Lab 12/27/21 2105 12/28/21 0500 12/29/21 0946 12/30/21 0211  NA 135 135 138 138  K 4.4 4.0 4.0 4.3  CL 100 101 104 104  CO2 22 22 24 25   GLUCOSE 121* 115* 123* 93  BUN 59* 53* 38* 30*  CREATININE 4.00* 3.40* 2.11* 1.78*  CALCIUM 8.9 8.6* 9.0 9.1   Liver  Function Tests: Recent Labs  Lab 12/27/21 2105  AST 16  ALT 13  ALKPHOS 100  BILITOT 1.2  PROT 6.0*  ALBUMIN 2.5*   CBG: No results for input(s): GLUCAP in the last 168 hours.  Discharge time spent: less than 30 minutes.  Signed: Annita Brod, MD [facility] 12/30/2021
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
## 2021-12-31 LAB — CBC
HCT: 27.9 % — ABNORMAL LOW (ref 36.0–46.0)
Hemoglobin: 8.8 g/dL — ABNORMAL LOW (ref 12.0–15.0)
MCH: 29.7 pg (ref 26.0–34.0)
MCHC: 31.5 g/dL (ref 30.0–36.0)
MCV: 94.3 fL (ref 80.0–100.0)
Platelets: 193 10*3/uL (ref 150–400)
RBC: 2.96 MIL/uL — ABNORMAL LOW (ref 3.87–5.11)
RDW: 20.1 % — ABNORMAL HIGH (ref 11.5–15.5)
WBC: 6.1 10*3/uL (ref 4.0–10.5)
nRBC: 0.7 % — ABNORMAL HIGH (ref 0.0–0.2)

## 2021-12-31 LAB — BASIC METABOLIC PANEL
Anion gap: 12 (ref 5–15)
BUN: 21 mg/dL (ref 8–23)
CO2: 25 mmol/L (ref 22–32)
Calcium: 9 mg/dL (ref 8.9–10.3)
Chloride: 100 mmol/L (ref 98–111)
Creatinine, Ser: 1.68 mg/dL — ABNORMAL HIGH (ref 0.44–1.00)
GFR, Estimated: 33 mL/min — ABNORMAL LOW (ref >60)
Glucose, Bld: 85 mg/dL (ref 70–99)
Potassium: 4.3 mmol/L (ref 3.5–5.1)
Sodium: 137 mmol/L (ref 135–145)

## 2021-12-31 LAB — CULTURE, BLOOD (ROUTINE X 2): Special Requests: ADEQUATE

## 2021-12-31 MED ORDER — AMLODIPINE BESYLATE 5 MG PO TABS
5.0000 mg | ORAL_TABLET | Freq: Every day | ORAL | Status: DC
  Administered 2021-12-31: 5 mg via ORAL
  Filled 2021-12-31: qty 1

## 2021-12-31 MED ORDER — LEVOFLOXACIN 250 MG PO TABS: 250.0000 mg | ORAL_TABLET | Freq: Every day | ORAL | 0 refills | Status: AC

## 2021-12-31 MED ORDER — AMLODIPINE BESYLATE 5 MG PO TABS: 5.0000 mg | ORAL_TABLET | Freq: Every day | ORAL | Status: AC

## 2021-12-31 NOTE — Plan of Care (Signed)

## 2021-12-31 NOTE — TOC Transition Note (Signed)
Transition of Care [facility]) - CM/SW Discharge Note   Patient Details  Name: NEHEMIAH MONTEE MRN: [id number] Date of Birth: 10-23-1956  Transition of [facility]) CM/SW Contact:  Emeterio Reeve, LCSW Phone Number: 12/31/2021, 11:37 AM   Clinical Narrative:     Per MD patient ready for DC to . RN, patient, patient's family, and facility notified of DC. Discharge Summary and FL2 sent to facility. DC packet on chart. Insurance Josem Kaufmann has been received and pt is covid negative. Ambulance transport requested for patient.    RN to call report to [phone]. Pt will go to room A2-1  CSW will sign off for now as social work intervention is no longer needed. Please consult [place] again if new needs arise.   Final next level of care: Skilled Nursing Facility Barriers to Discharge: Barriers Resolved   Patient Goals and CMS Choice Patient states their goals for this hospitalization and ongoing recovery are:: return to SNF CMS Medicare.gov Compare Post Acute Care list provided to:: Patient Choice offered to / list presented to : Patient  Discharge Placement              Patient chooses bed at: Other - please specify in the comment section below: ([place]) Patient to be transferred to facility by: [facility] Name of family member notified: pt is x4 and is aware of DC Patient and family notified of of transfer: 12/31/21  Discharge Plan and Services                                     Social Determinants of Health (SDOH) Interventions     Readmission Risk Interventions No flowsheet data found.  Emeterio Reeve, LCSW Clinical Social Worker
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
## 2022-01-09 ENCOUNTER — Ambulatory Visit (INDEPENDENT_AMBULATORY_CARE_PROVIDER_SITE_OTHER): Payer: Medicare Other | Admitting: Gastroenterology

## 2022-01-09 ENCOUNTER — Encounter: Payer: Self-pay | Admitting: Gastroenterology

## 2022-01-09 VITALS — BP 122/70 | HR 74 | Wt 195.0 lb

## 2022-01-09 DIAGNOSIS — C799 Secondary malignant neoplasm of unspecified site: Secondary | ICD-10-CM | POA: Diagnosis not present

## 2022-01-09 MED ORDER — SUTAB 1479-225-188 MG PO TABS: 1.0000 | ORAL_TABLET | Freq: Once | ORAL | 0 refills | Status: AC
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
## 2022-01-09 NOTE — Patient Instructions (Signed)
If you are age 68 or older, your body mass index should be between 23-30. Your Body mass index is 34.54 kg/m?Marland Kitchen If this is out of the aforementioned range listed, please consider follow up with your Primary Care Provider. ? ?If you are age 15 or younger, your body mass index should be between 19-25. Your Body mass index is 34.54 kg/m?Marland Kitchen If this is out of the aformentioned range listed, please consider follow up with your Primary Care Provider.  ? ?You have been scheduled for a colonoscopy. Please follow written instructions given to you at your visit today.  ?Please pick up your prep supplies at the pharmacy within the next 1-3 days. ?If you use inhalers (even only as needed), please bring them with you on the day of your procedure. ? ? ?The [facility] providers would like to encourage you to use [facility] to communicate with providers for non-urgent requests or questions.  Due to long hold times on the telephone, sending your provider a message by [facility] may be a faster and more efficient way to get a response.  Please allow 48 business hours for a response.  Please remember that this is for non-urgent requests.  ? ?It was a pleasure to see you today! ? ?Thank you for trusting me with your gastrointestinal care!   ? ?Scott E.Candis Schatz, MD  ?

## 2022-01-10 ENCOUNTER — Encounter: Payer: Self-pay | Admitting: Gastroenterology

## 2022-01-14 NOTE — Progress Notes (Signed)
Left message for Delcie Roch at [facility] regarding transportation for Ms Jessop's appt here on 01/17/2022. ?

## 2022-01-14 NOTE — Progress Notes (Signed)
Delcie Roch from [facility] called and confirmed that transportation has been arranged for Ms Ruberg's 01/17/2022 appt. ?

## 2022-01-17 ENCOUNTER — Inpatient Hospital Stay: Payer: Medicare Other | Attending: Hematology | Admitting: Hematology

## 2022-01-17 ENCOUNTER — Other Ambulatory Visit: Payer: Self-pay

## 2022-01-17 VITALS — BP 96/64 | HR 82 | Temp 97.7°F | Resp 17

## 2022-01-17 DIAGNOSIS — C787 Secondary malignant neoplasm of liver and intrahepatic bile duct: Secondary | ICD-10-CM | POA: Insufficient documentation

## 2022-01-17 DIAGNOSIS — Z85528 Personal history of other malignant neoplasm of kidney: Secondary | ICD-10-CM | POA: Diagnosis not present

## 2022-01-17 DIAGNOSIS — Z9049 Acquired absence of other specified parts of digestive tract: Secondary | ICD-10-CM | POA: Diagnosis not present

## 2022-01-17 DIAGNOSIS — C189 Malignant neoplasm of colon, unspecified: Secondary | ICD-10-CM | POA: Diagnosis present

## 2022-01-17 NOTE — Progress Notes (Signed)
I met with [name] and her sister, [name] before her consultation with Dr Burr Medico.  I explained my role as a nurse navigator and provided my contact information. ?All questions were answered.  They verbalized understanding. ? ?
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
## 2022-01-18 ENCOUNTER — Encounter: Payer: Self-pay | Admitting: Hematology

## 2022-01-20 ENCOUNTER — Telehealth: Payer: Self-pay | Admitting: Hematology

## 2022-01-20 NOTE — Telephone Encounter (Signed)
Scheduled follow-up appointment per 3/17 los. Patient's sister is aware. ?

## 2022-01-29 NOTE — Op Note (Addendum)
12/10/2021  11:23 PM  PATIENT:  [name]  June 04, 1956 female   MEDICAL RECORD NUMBER: [id number]  PRE-OPERATIVE DIAGNOSIS:   LEFT TYPE 1 OPEN TRIMALLEOLAR FRACTURE DISLOCATION LEFT ANKLE SYNDESMOSIS TEAR RIGHT BICONDYLAR TIBIAL PLATEAU FRACTURE RIGHT KNEE HEMARTHROSIS  POST-OPERATIVE DIAGNOSES (SAME):   LEFT TYPE 1 OPEN TRIMALLEOLAR FRACTURE DISLOCATION LEFT ANKLE SYNDESMOSIS TEAR RIGHT BICONDYLAR TIBIAL PLATEAU FRACTURE RIGHT KNEE HEMARTHROSIS  PROCEDURE:   OPEN REDUCTION INTERNAL FIXATION OF LEFT TRIMALLEOLAR ANKLE FRACTURE WITHOUT FIXATION OF THE POSTERIOR LIP OPEN REDUCTION INTERNAL FIXATION OF THE SYNDESMOSIS OPEN REDUCTION INTERNAL FIXATION OF RIGHT TIBIAL PLATEAU ANTERIOR COMPARTMENT FASCIOTOMY RIGHT LEG KNEE ASPIRATION OF RIGHT KNEE HEMARTHROSIS MANUAL APPLICATION OF STRESS UNDER FLUOROSCOPY RIGHT KNEE  SURGEON:  Doralee Albino. Carola Frost, M.D.  ASSISTANT:  Montez Morita, PA-C.  ANESTHESIA:  General.  COMPLICATIONS:  None.  TOURNIQUET: None.  ESTIMATED BLOOD LOSS:  60 mL.  DISPOSITION:  To PACU.  CONDITION:  Stable.  DELAY START OF DVT PROPHYLAXIS BECAUSE OF BLEEDING RISK: NO   BRIEF SUMMARY AND INDICATIONS FOR PROCEDURE:  The patient is a 66 y.o. who sustained a fracture dislocation of the ankle, treated with reduction at the time of presentation in the OR where undergoing ex-lap as a trauma activation, followed by splint application. Now extubated, I discussed with the patient the risks and benefits of surgery including the possibility of infection, DVT, PE, nerve injury, vessel injury, loss of motion, arthritis, symptomatic hardware, heart attack, stroke and need for further surgery, among others. We specifically discussed syndesmotic repair and that some of the implants used for this would likely require subsequent removal. After acknowledging these risks, consent was provided to proceed.  SUMMARY OF PROCEDURE:  The patient was taken to the operating room after  administration of a regional block and preoperative antibiotics.  The left and right lower extremities were prepped and draped in the usual sterile fashion.  A tourniquet was placed about the thigh but never inflated during the procedure.  A timeout was held, and then an incision was made directly over the medial wound which tracked directly down to bone and the medial malleolus fracture site. Using the scalpel, I sharply excised injured skin, subcutaneous tissue, and muscle. I also removed contaminated free cortical bone segments devoid of soft tissue attachment and then the curettes to additionally excise contaminated cancellous bone. 1000 cc of saline were then flushed through the exposed bone ends and ankle joint, with my assistant helping to deliver the bone ends and supplementing with soap wash and rinse, as well.  Once the debridement was completed, we turned our attention to fracture repair.   The lateral malleolus with careful dissection to avoid injury to the superficial peroneal nerve.  The periosteum was left intact as we continued deep dissection.  The fracture site was identified and curettage and lavage used to remove hematoma.  With the assistance of distal manipulation and placement of tenaculums, we were able to obtain an anatomic reduction of the primary fracture fragments.  Because of the angle and comminution of the fracture site, I was unable to place a lag screw but was able to hold this interdigitated during application of a lateral malleolus plate.  We used the Paragon system and placed standard fixation in the shaft and distal lateral malleolus, confirming plate position with x-ray and then continuing with a standard fixation as well as a locked fixation.  Final images showed appropriate reductional replacement, trajectory, and length.  Next, attention was turned to the medial side.  Here, the incision was extended to allow for distal placement of screws as well as direct access to the  medial malleolus fracture site and joint.  I did not identify any large fragments of articular cartilage loss off the dome of the talus. I then used a pointed tenaculum to gain compression and reduction of the fracture site.  This was followed by placement of K wires and then use of the cannulated drill placing 2 partially threaded screws at 130 mm and the other 28 mm.  Compression was obtained.  The C-arm was then brought back in and AP, lateral and mortise views showed restoration of ankle alignment reduction.    The posterior malleolar fracture fragment was then evaluated to gauge whether it should be fixed if it constituted a significant portion of the articular surface or whether, as an attachment site of the syndesmotic ligaments, its disruption and displacement contributed to instability. Based on these factors it did not require fixation. .   The ankle joint instability from the dislocation remained a factor, however. Therefore we reduced the joint under direct visualization and with the foot in appropriate position placed two 2 mm k wires across the joint to maintain this reduction to healing.  Syndesmotic instability was identified by lateral translation of the talus, widening of the syndesmotiic interval, and widening of the medial clear space. Consequently we proceeded with syndesmotic fixation. The pointed tenaculum was used to apply pressure for reduction from head of most distal screw in the plate. Two tricortical screws were then placed from the fibula into the tibia with 15 degrees of anteversion.   On the right knee I then brought in the radiolucent triangle. A curvilinear incision was made extending laterally over Gerdy's tubercle. Dissection was carried down where the soft tissues were left intact to the lateral plateau and rim, elevated only the insertion of the extensors. I was then able to insert the 9 hole Zimmer NCB tibial plateau plate and pin it proximally at the joint  line. Going back to the medial side a 5 hole DCP plate was pinned in position to buttress the medial platea. My assistant pulled traction and derotated the fracture while I used the [employer] clamp to achieve reduction. I then placed three bicortical screws distally securing the buttress effect. At this point, we placed standard lag screw fixation in the proximal row of the plate, which we would later convert to locked fixation by placing locking caps. Final images showed appropriate reduction, implant position and length. All wounds were irrigated thoroughly.   Prior to closure, I turned my attention to the distal edge of the wound here underneath the skin.  I used the long scissors to spread both superficial and deep to the anterior compartment.  The fascia was then released for 8 to 10 cm to reduce the likelihood of the postoperative compartment syndrome.  Once more, wound was irrigated and then a standard layered closure performed, 0 Vicryl, 2-0 Vicryl, and 3-0 nylon for the skin.  Sterile gently compressive dressing was applied and in knee immobilizer.  The patient was taken to the PACU in stable condition.   Lastly, the large knee hemarthrosis was aspirated removing 110 cc of blood.  Montez Morita, PA-C was present and assisting throughout with all portions of the procedure.   PROGNOSIS: The patient will be nonweightbearing bilaterally without bracing on the right and  in the splint on left with ice and elevation over the next 3 to 5 days.  We  will plan to see her back in the office in 10-14 days for removal of sutures and transition to a cast at that time.  Weightbearing at 8 weeks bilaterally.

## 2022-01-30 ENCOUNTER — Ambulatory Visit: Payer: Medicare Other | Admitting: Gastroenterology

## 2022-01-31 NOTE — Progress Notes (Signed)
Attempted to obtain medical history via telephone, unable to reach at this time. I left a voicemail to return pre surgical testing department's phone call.  

## 2022-02-06 ENCOUNTER — Telehealth: Payer: Self-pay

## 2022-02-06 NOTE — Progress Notes (Signed)
Spoke with patient's nurse Brooke Pace at [facility] and reviewed instructions for colonoscopy on 02-10-22.  She stated that she had prep instructions but no instructions on Heparin.  Brooke Pace was given the phone number to Dr. Libby Maw office to check on if Heparin needs to be stopped. ?

## 2022-02-06 NOTE — Progress Notes (Signed)
Received a call from [place] requesting instructions regarding patients med heparin whether it needs to be held for her colonoscopy on Monday. I told her to call [facility] to talk with the MD doing the procedure at [phone]. Deja from [facility] said she just talked to them and they said they should call [facility] . I told Deja that the GI doctor doing the procedure would decide about the blood thinner and not the procedural area. This RN talked to Manito at [place] and told her that the [place] at [place] needed to know about what to do with the patients blood thinner. Amy took the information and she said she would pass this on to the person handling the case.  ?

## 2022-02-06 NOTE — Telephone Encounter (Signed)
Returned call from Theba from [place]. She stated that they were unsure why the patient was on Heparin but was taking it once daily. Let her know to have patient hold Hepatin the day of her procedure.  ?

## 2022-02-06 NOTE — Progress Notes (Addendum)
Preop instructions for:     [name] ?Date of Birth:          Jul 31, 1956             ?Date of Procedure:   02-10-22 ?Procedure:     Colonoscopy ?Surgeon: Dr. Dayna Barker ?Facility contact:     Phone:  [phone]  ?RN contact name/phone#:  Fax #: [phone] ?  ?Transportation contact phone#:  ? ?  ?Time to arrive at [facility]:  6:00 AM ?  ?Report to: Admitting (On your left hand side)  ?  ?Do not eat or drink past midnight the night before your procedure.(To include any tube feedings-must be discontinued) ?  ?Take these morning medications only with sips of water.(or give through gastrostomy or feeding tube).   Tylenol, Allopurinol, Amlodipine, Gabapentin, Lovastatin, Metoprolol ?  ?The Day Before Surgery:   Follow colonoscopy prep instructions from surgeon's office. ? ?Please check on instructions for Heparin if patient is still on  ?  ?Note: No Insulin or Diabetic meds should be given or taken the morning of the procedure!  ?  ?Please send day of procedure:current med list and meds last taken that day, confirm nothing by mouth status from what time, ?Patient Demographic info( to include DNR status, problem list, allergies) ?  ?Bring Insurance card and picture ID ?Leave all jewelry and other valuables at place where living( no metal or rings to be worn) ?No contact lens ?Women-no make-up, no lotions,perfumes,powders ?Men-no colognes,lotions ?  ?Any questions day of procedure,call  [phone] ?  ?  ?Sent from :[facility] ?                  [phone] ?                  Fax:[phone] ?  ?Sent by :    Norvel Richards, RN  ?

## 2022-02-08 ENCOUNTER — Encounter (HOSPITAL_COMMUNITY): Payer: Self-pay | Admitting: Anesthesiology

## 2022-02-08 NOTE — Anesthesia Preprocedure Evaluation (Deleted)
Anesthesia Evaluation  ?Patient identified by MRN, date of birth, ID band ?Patient awake ? ? ? ?Reviewed: ?Allergy & Precautions, NPO status , Patient's Chart, lab work & pertinent test results, reviewed documented beta blocker date and time  ? ?Airway ?Mallampati: IV ? ?TM Distance: >3 FB ?Neck ROM: Limited ? ? ? Dental ? ?(+) Teeth Intact, Dental Advisory Given ?  ?Pulmonary ?neg pulmonary ROS,  ?  ?Pulmonary exam normal ?breath sounds clear to auscultation ? ? ? ? ? ? Cardiovascular ?hypertension, Pt. on home beta blockers and Pt. on medications ?Normal cardiovascular exam ?Rhythm:Regular Rate:Normal ? ? ?  ?Neuro/Psych ?C2 spine fracture - Maintain C collar ?TIA Neuromuscular disease negative psych ROS  ? GI/Hepatic ?negative GI ROS, (+) Hepatitis -  ?Endo/Other  ?Obesity ? ? Renal/GU ?ARFRenal disease (RCC)  ? ?  ?Musculoskeletal ?negative musculoskeletal ROS ?(+)  ? Abdominal ?  ?Peds ? Hematology ? ?(+) Blood dyscrasia, anemia ,   ?Anesthesia Other Findings ?Chronic anemia ?Metastatic colon cancer to liver [facility]) ?Leukocytosis ? ? ? Reproductive/Obstetrics ? ?  ? ? ? ? ? ? ? ? ? ? ? ? ? ?  ?  ? ? ? ? ? ? ? ? ?Anesthesia Physical ? ?Anesthesia Plan ? ?ASA: 4 ? ?Anesthesia Plan: MAC  ? ?Post-op Pain Management: Tylenol PO (pre-op)  ? ?Induction: Intravenous ? ?PONV Risk Score and Plan: 3 and Propofol infusion ? ?Airway Management Planned: Natural Airway ? ?Additional Equipment:  ? ?Intra-op Plan:  ? ?Post-operative Plan: Extubation in OR ? ?Informed Consent: I have reviewed the patients History and Physical, chart, labs and discussed the procedure including the risks, benefits and alternatives for the proposed anesthesia with the patient or authorized representative who has indicated his/her understanding and acceptance.  ? ? ? ?Dental advisory given ? ?Plan Discussed with: CRNA ? ?Anesthesia Plan Comments:   ? ? ? ? ? ? ?Anesthesia Quick Evaluation ? ?

## 2022-02-10 ENCOUNTER — Encounter (HOSPITAL_COMMUNITY): Admission: RE | Payer: Self-pay | Source: Home / Self Care

## 2022-02-10 ENCOUNTER — Ambulatory Visit (HOSPITAL_COMMUNITY): Admission: RE | Admit: 2022-02-10 | Payer: Medicare Other | Source: Home / Self Care | Admitting: Internal Medicine

## 2022-02-10 SURGERY — COLONOSCOPY WITH PROPOFOL
Anesthesia: Monitor Anesthesia Care

## 2022-02-10 MED ORDER — PROPOFOL 500 MG/50ML IV EMUL
INTRAVENOUS | Status: AC
  Filled 2022-02-10: qty 50

## 2022-02-10 MED ORDER — PROPOFOL 1000 MG/100ML IV EMUL
INTRAVENOUS | Status: AC
  Filled 2022-02-10: qty 100

## 2022-02-10 NOTE — Progress Notes (Signed)
Pt was a no show for her colonoscopy today. I called and talked with nurse Deja and she said there was no prep ordered or given so they would need to reschedule.  ?

## 2022-02-11 NOTE — Progress Notes (Signed)
I spoke with transportation coordinator at [facility].  I arranged transport for [name]'s appt on 4/17 at 1000.  I asked that she be here by 0945.  I also called her son, [name] and let him know about this appt.  All questions were answered.  He verbalized understanding. ? ?

## 2022-02-14 ENCOUNTER — Other Ambulatory Visit: Payer: Self-pay

## 2022-02-14 DIAGNOSIS — C189 Malignant neoplasm of colon, unspecified: Secondary | ICD-10-CM

## 2022-02-17 ENCOUNTER — Inpatient Hospital Stay: Payer: Medicare Other | Attending: Hematology | Admitting: Hematology

## 2022-02-17 ENCOUNTER — Inpatient Hospital Stay: Payer: Medicare Other

## 2022-02-17 DIAGNOSIS — C189 Malignant neoplasm of colon, unspecified: Secondary | ICD-10-CM

## 2022-02-19 ENCOUNTER — Other Ambulatory Visit: Payer: Self-pay | Admitting: Student

## 2022-02-19 DIAGNOSIS — S12130S Unspecified traumatic displaced spondylolisthesis of second cervical vertebra, sequela: Secondary | ICD-10-CM

## 2022-02-20 ENCOUNTER — Other Ambulatory Visit (HOSPITAL_COMMUNITY): Payer: Self-pay | Admitting: Student

## 2022-02-20 DIAGNOSIS — S12130S Unspecified traumatic displaced spondylolisthesis of second cervical vertebra, sequela: Secondary | ICD-10-CM

## 2022-03-04 ENCOUNTER — Telehealth: Payer: Self-pay

## 2022-03-04 NOTE — Telephone Encounter (Signed)
Called phone number in patients chart and her sister answered the phone. Patients sister stated that patient wanted to wait until after she was on her feet to have Colonoscopy done.  ?

## 2022-03-12 ENCOUNTER — Telehealth: Payer: Self-pay | Admitting: Hematology

## 2022-03-12 NOTE — Telephone Encounter (Signed)
I called and spoke with pt's sister Learta Codding. I encouraged her to follow up with [place]. She agreed with a follow up appointment in 6 weeks, schedule message sent. ? ?Truitt Merle  ?03/12/2022  ?

## 2022-03-12 NOTE — Progress Notes (Signed)
I spoke to Ms Buford's sister, Learta Codding.  I told her that her sister missed her appt with Dr Burr Medico last month.  Learta Codding thought the appt had been cancelled.  She states that Chellsie has decided NOT to pursue further treatment for her colon cancer.  I told Learta Codding I would speak with Dr Burr Medico and then call her with that conversation.  Learta Codding states that Clorissa is expecting to be d/c home in the next 3 weeks ?

## 2022-03-13 ENCOUNTER — Ambulatory Visit (HOSPITAL_COMMUNITY)
Admission: RE | Admit: 2022-03-13 | Discharge: 2022-03-13 | Disposition: A | Discharge status: Home / Self Care | Payer: Medicare Other | Source: Ambulatory Visit | Attending: Student | Admitting: Student

## 2022-03-13 DIAGNOSIS — S12130S Unspecified traumatic displaced spondylolisthesis of second cervical vertebra, sequela: Secondary | ICD-10-CM | POA: Insufficient documentation

## 2022-03-21 ENCOUNTER — Telehealth: Payer: Self-pay | Admitting: Hematology

## 2022-03-21 NOTE — Telephone Encounter (Signed)
.  Called patient to schedule appointment per 5/11 inbasket, patient is aware of date and time.

## 2022-04-24 ENCOUNTER — Inpatient Hospital Stay: Payer: Medicare Other

## 2022-04-24 ENCOUNTER — Inpatient Hospital Stay: Payer: Medicare Other | Admitting: Hematology

## 2022-04-30 ENCOUNTER — Ambulatory Visit (INDEPENDENT_AMBULATORY_CARE_PROVIDER_SITE_OTHER): Payer: Medicare Other | Admitting: Podiatry

## 2022-04-30 ENCOUNTER — Encounter: Payer: Self-pay | Admitting: Podiatry

## 2022-04-30 DIAGNOSIS — B351 Tinea unguium: Secondary | ICD-10-CM

## 2022-04-30 DIAGNOSIS — M79674 Pain in right toe(s): Secondary | ICD-10-CM

## 2022-04-30 DIAGNOSIS — M79675 Pain in left toe(s): Secondary | ICD-10-CM | POA: Diagnosis not present

## 2022-05-03 ENCOUNTER — Encounter: Payer: Self-pay | Admitting: Podiatry

## 2022-05-03 NOTE — Progress Notes (Signed)
  Subjective:  Patient ID: [name], female    DOB: 1956/04/09,  MRN: [id number]  Chief Complaint  Patient presents with   Nail Problem       NP  problem with nail coming off not diabetic needs feet evaluated    66 y.o. female presents with the above complaint. History confirmed with patient.  Nails are thick and painful 1, off couple weeks ago she is on blood thinner for Eliquis.  Objective:  Physical Exam: warm, good capillary refill, no trophic changes or ulcerative lesions, normal DP and PT pulses, and normal sensory exam. Left Foot: dystrophic yellowed discolored nail plates with subungual debris and   Right Foot: dystrophic yellowed discolored nail plates with subungual debris and hallux nail removed already, no ulceration or bleeding actively   Assessment:   1. Pain due to onychomycosis of toenails of both feet      Plan:  Patient was evaluated and treated and all questions answered.  Discussed the etiology and treatment options for the condition in detail with the patient. Educated patient on the topical and oral treatment options for mycotic nails. Recommended debridement of the nails today. Sharp and mechanical debridement performed of all painful and mycotic nails today. Nails debrided in length and thickness using a nail nipper to level of comfort. Discussed treatment options including appropriate shoe gear. Follow up as needed for painful nails.     Return in about 3 months (around 07/31/2022) for painful thick nails.
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
## 2022-05-12 ENCOUNTER — Inpatient Hospital Stay: Payer: Medicare Other | Attending: Hematology

## 2022-05-12 ENCOUNTER — Inpatient Hospital Stay (HOSPITAL_BASED_OUTPATIENT_CLINIC_OR_DEPARTMENT_OTHER): Payer: Medicare Other | Admitting: Hematology

## 2022-05-12 ENCOUNTER — Encounter: Payer: Self-pay | Admitting: Hematology

## 2022-05-12 ENCOUNTER — Other Ambulatory Visit: Payer: Self-pay

## 2022-05-12 VITALS — BP 123/68 | HR 90 | Temp 97.4°F | Resp 18 | Ht 63.0 in | Wt 193.5 lb

## 2022-05-12 DIAGNOSIS — Z79899 Other long term (current) drug therapy: Secondary | ICD-10-CM | POA: Insufficient documentation

## 2022-05-12 DIAGNOSIS — C189 Malignant neoplasm of colon, unspecified: Secondary | ICD-10-CM | POA: Insufficient documentation

## 2022-05-12 DIAGNOSIS — I700 Atherosclerosis of aorta: Secondary | ICD-10-CM | POA: Insufficient documentation

## 2022-05-12 DIAGNOSIS — K760 Fatty (change of) liver, not elsewhere classified: Secondary | ICD-10-CM | POA: Diagnosis not present

## 2022-05-12 DIAGNOSIS — S36892A Contusion of other intra-abdominal organs, initial encounter: Secondary | ICD-10-CM | POA: Insufficient documentation

## 2022-05-12 DIAGNOSIS — R059 Cough, unspecified: Secondary | ICD-10-CM | POA: Diagnosis not present

## 2022-05-12 DIAGNOSIS — Z8673 Personal history of transient ischemic attack (TIA), and cerebral infarction without residual deficits: Secondary | ICD-10-CM | POA: Diagnosis not present

## 2022-05-12 DIAGNOSIS — C787 Secondary malignant neoplasm of liver and intrahepatic bile duct: Secondary | ICD-10-CM | POA: Diagnosis not present

## 2022-05-12 DIAGNOSIS — Z9049 Acquired absence of other specified parts of digestive tract: Secondary | ICD-10-CM | POA: Insufficient documentation

## 2022-05-12 DIAGNOSIS — Z87442 Personal history of urinary calculi: Secondary | ICD-10-CM | POA: Diagnosis not present

## 2022-05-12 DIAGNOSIS — R5383 Other fatigue: Secondary | ICD-10-CM | POA: Insufficient documentation

## 2022-05-12 DIAGNOSIS — E785 Hyperlipidemia, unspecified: Secondary | ICD-10-CM | POA: Insufficient documentation

## 2022-05-12 DIAGNOSIS — R0602 Shortness of breath: Secondary | ICD-10-CM | POA: Insufficient documentation

## 2022-05-12 DIAGNOSIS — D649 Anemia, unspecified: Secondary | ICD-10-CM | POA: Insufficient documentation

## 2022-05-12 DIAGNOSIS — Z85528 Personal history of other malignant neoplasm of kidney: Secondary | ICD-10-CM | POA: Diagnosis not present

## 2022-05-12 DIAGNOSIS — Z7901 Long term (current) use of anticoagulants: Secondary | ICD-10-CM | POA: Insufficient documentation

## 2022-05-12 DIAGNOSIS — I10 Essential (primary) hypertension: Secondary | ICD-10-CM | POA: Diagnosis not present

## 2022-05-12 LAB — CBC WITH DIFFERENTIAL (CANCER CENTER ONLY)
Abs Immature Granulocytes: 0.05 10*3/uL (ref 0.00–0.07)
Basophils Absolute: 0.1 10*3/uL (ref 0.0–0.1)
Basophils Relative: 1 %
Eosinophils Absolute: 0.1 10*3/uL (ref 0.0–0.5)
Eosinophils Relative: 1 %
HCT: 25 % — ABNORMAL LOW (ref 36.0–46.0)
Hemoglobin: 7.5 g/dL — ABNORMAL LOW (ref 12.0–15.0)
Immature Granulocytes: 1 %
Lymphocytes Relative: 28 %
Lymphs Abs: 2.1 10*3/uL (ref 0.7–4.0)
MCH: 31.1 pg (ref 26.0–34.0)
MCHC: 30 g/dL (ref 30.0–36.0)
MCV: 103.7 fL — ABNORMAL HIGH (ref 80.0–100.0)
Monocytes Absolute: 0.6 10*3/uL (ref 0.1–1.0)
Monocytes Relative: 8 %
Neutro Abs: 4.6 10*3/uL (ref 1.7–7.7)
Neutrophils Relative %: 61 %
Platelet Count: 250 10*3/uL (ref 150–400)
RBC: 2.41 MIL/uL — ABNORMAL LOW (ref 3.87–5.11)
RDW: 18.2 % — ABNORMAL HIGH (ref 11.5–15.5)
WBC Count: 7.5 10*3/uL (ref 4.0–10.5)
nRBC: 0.4 % — ABNORMAL HIGH (ref 0.0–0.2)

## 2022-05-12 LAB — CMP (CANCER CENTER ONLY)
ALT: 12 U/L (ref 0–44)
AST: 13 U/L — ABNORMAL LOW (ref 15–41)
Albumin: 2.8 g/dL — ABNORMAL LOW (ref 3.5–5.0)
Alkaline Phosphatase: 160 U/L — ABNORMAL HIGH (ref 38–126)
Anion gap: 6 (ref 5–15)
BUN: 31 mg/dL — ABNORMAL HIGH (ref 8–23)
CO2: 24 mmol/L (ref 22–32)
Calcium: 8.5 mg/dL — ABNORMAL LOW (ref 8.9–10.3)
Chloride: 109 mmol/L (ref 98–111)
Creatinine: 1.93 mg/dL — ABNORMAL HIGH (ref 0.44–1.00)
GFR, Estimated: 28 mL/min — ABNORMAL LOW (ref >60)
Glucose, Bld: 117 mg/dL — ABNORMAL HIGH (ref 70–99)
Potassium: 4.4 mmol/L (ref 3.5–5.1)
Sodium: 139 mmol/L (ref 135–145)
Total Bilirubin: 0.3 mg/dL (ref 0.3–1.2)
Total Protein: 6.1 g/dL — ABNORMAL LOW (ref 6.5–8.1)

## 2022-05-20 ENCOUNTER — Other Ambulatory Visit: Payer: Self-pay

## 2022-06-02 ENCOUNTER — Inpatient Hospital Stay: Payer: Medicare Other

## 2022-06-02 ENCOUNTER — Other Ambulatory Visit (HOSPITAL_COMMUNITY): Payer: Medicare Other

## 2022-06-05 ENCOUNTER — Inpatient Hospital Stay: Payer: Medicare Other | Admitting: Hematology

## 2022-06-30 ENCOUNTER — Other Ambulatory Visit: Payer: Self-pay | Admitting: Internal Medicine

## 2022-06-30 DIAGNOSIS — Z1231 Encounter for screening mammogram for malignant neoplasm of breast: Secondary | ICD-10-CM

## 2022-07-01 ENCOUNTER — Other Ambulatory Visit: Payer: Self-pay

## 2022-07-01 DIAGNOSIS — C189 Malignant neoplasm of colon, unspecified: Secondary | ICD-10-CM

## 2022-07-01 NOTE — Progress Notes (Signed)
Pt changed her mind regarding having CT Scan being performed.  Pt is now wanting to know if the cancer has progressed.  Re-entered the order for CT Scan per Dr. Ernestina Penna request in [place] conversation with this RN, April Prait, and Gaetano Hawthorne.

## 2022-07-18 ENCOUNTER — Other Ambulatory Visit: Payer: Self-pay

## 2022-07-18 ENCOUNTER — Ambulatory Visit (HOSPITAL_COMMUNITY)
Admission: RE | Admit: 2022-07-18 | Discharge: 2022-07-18 | Disposition: A | Discharge status: Home / Self Care | Payer: Medicare Other | Source: Ambulatory Visit | Attending: Hematology | Admitting: Hematology

## 2022-07-18 ENCOUNTER — Inpatient Hospital Stay (HOSPITAL_BASED_OUTPATIENT_CLINIC_OR_DEPARTMENT_OTHER): Payer: Medicare Other | Admitting: Hematology

## 2022-07-18 ENCOUNTER — Inpatient Hospital Stay: Payer: Medicare Other

## 2022-07-18 ENCOUNTER — Encounter: Payer: Self-pay | Admitting: Hematology

## 2022-07-18 ENCOUNTER — Inpatient Hospital Stay: Payer: Medicare Other | Attending: Hematology

## 2022-07-18 ENCOUNTER — Other Ambulatory Visit: Payer: Self-pay | Admitting: Hematology

## 2022-07-18 DIAGNOSIS — C189 Malignant neoplasm of colon, unspecified: Secondary | ICD-10-CM | POA: Diagnosis not present

## 2022-07-18 DIAGNOSIS — I700 Atherosclerosis of aorta: Secondary | ICD-10-CM | POA: Insufficient documentation

## 2022-07-18 DIAGNOSIS — E785 Hyperlipidemia, unspecified: Secondary | ICD-10-CM | POA: Insufficient documentation

## 2022-07-18 DIAGNOSIS — S2241XA Multiple fractures of ribs, right side, initial encounter for closed fracture: Secondary | ICD-10-CM | POA: Insufficient documentation

## 2022-07-18 DIAGNOSIS — Z8673 Personal history of transient ischemic attack (TIA), and cerebral infarction without residual deficits: Secondary | ICD-10-CM | POA: Insufficient documentation

## 2022-07-18 DIAGNOSIS — Z79899 Other long term (current) drug therapy: Secondary | ICD-10-CM | POA: Insufficient documentation

## 2022-07-18 DIAGNOSIS — M47812 Spondylosis without myelopathy or radiculopathy, cervical region: Secondary | ICD-10-CM | POA: Insufficient documentation

## 2022-07-18 DIAGNOSIS — Z85528 Personal history of other malignant neoplasm of kidney: Secondary | ICD-10-CM | POA: Insufficient documentation

## 2022-07-18 DIAGNOSIS — D649 Anemia, unspecified: Secondary | ICD-10-CM | POA: Insufficient documentation

## 2022-07-18 DIAGNOSIS — C787 Secondary malignant neoplasm of liver and intrahepatic bile duct: Secondary | ICD-10-CM | POA: Diagnosis present

## 2022-07-18 DIAGNOSIS — Z7901 Long term (current) use of anticoagulants: Secondary | ICD-10-CM | POA: Insufficient documentation

## 2022-07-18 DIAGNOSIS — K760 Fatty (change of) liver, not elsewhere classified: Secondary | ICD-10-CM | POA: Insufficient documentation

## 2022-07-18 DIAGNOSIS — D500 Iron deficiency anemia secondary to blood loss (chronic): Secondary | ICD-10-CM

## 2022-07-18 DIAGNOSIS — Z87442 Personal history of urinary calculi: Secondary | ICD-10-CM | POA: Insufficient documentation

## 2022-07-18 LAB — SAMPLE TO BLOOD BANK

## 2022-07-18 LAB — CBC WITH DIFFERENTIAL (CANCER CENTER ONLY)
Abs Immature Granulocytes: 0.09 10*3/uL — ABNORMAL HIGH (ref 0.00–0.07)
Basophils Absolute: 0.1 10*3/uL (ref 0.0–0.1)
Basophils Relative: 1 %
Eosinophils Absolute: 0.1 10*3/uL (ref 0.0–0.5)
Eosinophils Relative: 2 %
HCT: 17.1 % — ABNORMAL LOW (ref 36.0–46.0)
Hemoglobin: 4.8 g/dL — CL (ref 12.0–15.0)
Immature Granulocytes: 1 %
Lymphocytes Relative: 24 %
Lymphs Abs: 2 10*3/uL (ref 0.7–4.0)
MCH: 25.9 pg — ABNORMAL LOW (ref 26.0–34.0)
MCHC: 28.1 g/dL — ABNORMAL LOW (ref 30.0–36.0)
MCV: 92.4 fL (ref 80.0–100.0)
Monocytes Absolute: 0.5 10*3/uL (ref 0.1–1.0)
Monocytes Relative: 6 %
Neutro Abs: 5.6 10*3/uL (ref 1.7–7.7)
Neutrophils Relative %: 66 %
Platelet Count: 294 10*3/uL (ref 150–400)
RBC: 1.85 MIL/uL — ABNORMAL LOW (ref 3.87–5.11)
RDW: 18.1 % — ABNORMAL HIGH (ref 11.5–15.5)
WBC Count: 8.4 10*3/uL (ref 4.0–10.5)
nRBC: 1 % — ABNORMAL HIGH (ref 0.0–0.2)

## 2022-07-18 LAB — CMP (CANCER CENTER ONLY)
ALT: 16 U/L (ref 0–44)
AST: 17 U/L (ref 15–41)
Albumin: 2.3 g/dL — ABNORMAL LOW (ref 3.5–5.0)
Alkaline Phosphatase: 135 U/L — ABNORMAL HIGH (ref 38–126)
Anion gap: 6 (ref 5–15)
BUN: 30 mg/dL — ABNORMAL HIGH (ref 8–23)
CO2: 24 mmol/L (ref 22–32)
Calcium: 8.3 mg/dL — ABNORMAL LOW (ref 8.9–10.3)
Chloride: 108 mmol/L (ref 98–111)
Creatinine: 1.76 mg/dL — ABNORMAL HIGH (ref 0.44–1.00)
GFR, Estimated: 31 mL/min — ABNORMAL LOW (ref >60)
Glucose, Bld: 96 mg/dL (ref 70–99)
Potassium: 4.9 mmol/L (ref 3.5–5.1)
Sodium: 138 mmol/L (ref 135–145)
Total Bilirubin: 0.3 mg/dL (ref 0.3–1.2)
Total Protein: 5.8 g/dL — ABNORMAL LOW (ref 6.5–8.1)

## 2022-07-18 LAB — FERRITIN: Ferritin: 7 ng/mL — ABNORMAL LOW (ref 11–307)

## 2022-07-18 LAB — PREPARE RBC (CROSSMATCH)

## 2022-07-18 MED ORDER — SODIUM CHLORIDE 0.9% IV SOLUTION: 250.0000 mL | Freq: Once | INTRAVENOUS | Status: DC

## 2022-07-18 NOTE — Progress Notes (Signed)
Blood products

## 2022-07-18 NOTE — Patient Instructions (Signed)
Blood Transfusion, Adult, Care After The following information offers guidance on how to care for yourself after your procedure. Your health care provider may also give you more specific instructions. If you have problems or questions, contact your health care provider. What can I expect after the procedure? After the procedure, it is common to have: Bruising and soreness where the IV was inserted. A headache. Follow these instructions at home: IV insertion site care     Follow instructions from your health care provider about how to take care of your IV insertion site. Make sure you: Wash your hands with soap and water for at least 20 seconds before and after you change your bandage (dressing). If soap and water are not available, use hand sanitizer. Change your dressing as told by your health care provider. Check your IV insertion site every day for signs of infection. Check for: Redness, swelling, or pain. Bleeding from the site. Warmth. Pus or a bad smell. General instructions Take over-the-counter and prescription medicines only as told by your health care provider. Rest as told by your health care provider. Return to your normal activities as told by your health care provider. Keep all follow-up visits. Lab tests may need to be done at certain periods to recheck your blood counts. Contact a health care provider if: You have itching or red, swollen areas of skin (hives). You have a fever or chills. You have pain in the head, back, or chest. You feel anxious or you feel weak after doing your normal activities. You have redness, swelling, warmth, or pain around the IV insertion site. You have blood coming from the IV insertion site that does not stop with pressure. You have pus or a bad smell coming from your IV insertion site. If you received your blood transfusion in an outpatient setting, you will be told whom to contact to report any reactions. Get help right away if: You  have symptoms of a serious allergic or immune system reaction, including: Trouble breathing or shortness of breath. Swelling of the face, feeling flushed, or widespread rash. Dark urine or blood in the urine. Fast heartbeat. These symptoms may be an emergency. Get help right away. Call 911. Do not wait to see if the symptoms will go away. Do not drive yourself to the hospital. Summary Bruising and soreness around the IV insertion site are common. Check your IV insertion site every day for signs of infection. Rest as told by your health care provider. Return to your normal activities as told by your health care provider. Get help right away for symptoms of a serious allergic or immune system reaction to the blood transfusion. This information is not intended to replace advice given to you by your health care provider. Make sure you discuss any questions you have with your health care provider. Document Revised: 01/17/2022 Document Reviewed: 01/17/2022 Elsevier Patient Education  2023 [employer]  
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
## 2022-07-18 NOTE — Progress Notes (Signed)
Spoke with Mardene Celeste in [facility] to confirm T&S and prepare order.  Pt is actively bleeding and will need 2 units of PRBCs today and possibly 2 PRBCs on Monday as well.  Will schedule pt for lab and infusion appt for Monday as well.

## 2022-07-19 LAB — TYPE AND SCREEN
ABO/RH(D): A POS
Antibody Screen: NEGATIVE
Unit division: 0
Unit division: 0

## 2022-07-19 LAB — BPAM RBC
Blood Product Expiration Date: 202310082359
Blood Product Expiration Date: 202310082359
ISSUE DATE / TIME: 202309151318
ISSUE DATE / TIME: 202309151318
Unit Type and Rh: 6200
Unit Type and Rh: 6200

## 2022-07-21 ENCOUNTER — Encounter: Payer: Self-pay | Admitting: Hematology

## 2022-07-21 ENCOUNTER — Inpatient Hospital Stay (HOSPITAL_BASED_OUTPATIENT_CLINIC_OR_DEPARTMENT_OTHER): Payer: Medicare Other | Admitting: Hematology

## 2022-07-21 ENCOUNTER — Inpatient Hospital Stay: Payer: Medicare Other

## 2022-07-21 ENCOUNTER — Other Ambulatory Visit: Payer: Self-pay

## 2022-07-21 VITALS — BP 136/77 | HR 102 | Temp 98.1°F | Resp 18 | Ht 63.0 in | Wt 195.7 lb

## 2022-07-21 DIAGNOSIS — D649 Anemia, unspecified: Secondary | ICD-10-CM | POA: Diagnosis not present

## 2022-07-21 DIAGNOSIS — M47812 Spondylosis without myelopathy or radiculopathy, cervical region: Secondary | ICD-10-CM | POA: Diagnosis not present

## 2022-07-21 DIAGNOSIS — Z85528 Personal history of other malignant neoplasm of kidney: Secondary | ICD-10-CM | POA: Diagnosis not present

## 2022-07-21 DIAGNOSIS — C189 Malignant neoplasm of colon, unspecified: Secondary | ICD-10-CM

## 2022-07-21 DIAGNOSIS — I700 Atherosclerosis of aorta: Secondary | ICD-10-CM | POA: Diagnosis not present

## 2022-07-21 DIAGNOSIS — K760 Fatty (change of) liver, not elsewhere classified: Secondary | ICD-10-CM | POA: Diagnosis not present

## 2022-07-21 DIAGNOSIS — D500 Iron deficiency anemia secondary to blood loss (chronic): Secondary | ICD-10-CM

## 2022-07-21 DIAGNOSIS — Z8673 Personal history of transient ischemic attack (TIA), and cerebral infarction without residual deficits: Secondary | ICD-10-CM | POA: Diagnosis not present

## 2022-07-21 DIAGNOSIS — Z79899 Other long term (current) drug therapy: Secondary | ICD-10-CM | POA: Diagnosis not present

## 2022-07-21 DIAGNOSIS — Z7901 Long term (current) use of anticoagulants: Secondary | ICD-10-CM | POA: Diagnosis not present

## 2022-07-21 DIAGNOSIS — C787 Secondary malignant neoplasm of liver and intrahepatic bile duct: Secondary | ICD-10-CM

## 2022-07-21 DIAGNOSIS — E785 Hyperlipidemia, unspecified: Secondary | ICD-10-CM | POA: Diagnosis not present

## 2022-07-21 DIAGNOSIS — S2241XA Multiple fractures of ribs, right side, initial encounter for closed fracture: Secondary | ICD-10-CM | POA: Diagnosis not present

## 2022-07-21 DIAGNOSIS — Z87442 Personal history of urinary calculi: Secondary | ICD-10-CM | POA: Diagnosis not present

## 2022-07-21 LAB — CMP (CANCER CENTER ONLY)
ALT: 12 U/L (ref 0–44)
AST: 12 U/L — ABNORMAL LOW (ref 15–41)
Albumin: 2.6 g/dL — ABNORMAL LOW (ref 3.5–5.0)
Alkaline Phosphatase: 128 U/L — ABNORMAL HIGH (ref 38–126)
Anion gap: 7 (ref 5–15)
BUN: 29 mg/dL — ABNORMAL HIGH (ref 8–23)
CO2: 21 mmol/L — ABNORMAL LOW (ref 22–32)
Calcium: 8 mg/dL — ABNORMAL LOW (ref 8.9–10.3)
Chloride: 107 mmol/L (ref 98–111)
Creatinine: 1.45 mg/dL — ABNORMAL HIGH (ref 0.44–1.00)
GFR, Estimated: 39 mL/min — ABNORMAL LOW (ref >60)
Glucose, Bld: 121 mg/dL — ABNORMAL HIGH (ref 70–99)
Potassium: 4.7 mmol/L (ref 3.5–5.1)
Sodium: 135 mmol/L (ref 135–145)
Total Bilirubin: 0.3 mg/dL (ref 0.3–1.2)
Total Protein: 5.6 g/dL — ABNORMAL LOW (ref 6.5–8.1)

## 2022-07-21 LAB — CBC WITH DIFFERENTIAL (CANCER CENTER ONLY)
Abs Immature Granulocytes: 0.14 10*3/uL — ABNORMAL HIGH (ref 0.00–0.07)
Basophils Absolute: 0.1 10*3/uL (ref 0.0–0.1)
Basophils Relative: 1 %
Eosinophils Absolute: 0.1 10*3/uL (ref 0.0–0.5)
Eosinophils Relative: 2 %
HCT: 24.9 % — ABNORMAL LOW (ref 36.0–46.0)
Hemoglobin: 7.6 g/dL — ABNORMAL LOW (ref 12.0–15.0)
Immature Granulocytes: 2 %
Lymphocytes Relative: 19 %
Lymphs Abs: 1.6 10*3/uL (ref 0.7–4.0)
MCH: 28.5 pg (ref 26.0–34.0)
MCHC: 30.5 g/dL (ref 30.0–36.0)
MCV: 93.3 fL (ref 80.0–100.0)
Monocytes Absolute: 0.6 10*3/uL (ref 0.1–1.0)
Monocytes Relative: 7 %
Neutro Abs: 5.8 10*3/uL (ref 1.7–7.7)
Neutrophils Relative %: 69 %
Platelet Count: 261 10*3/uL (ref 150–400)
RBC: 2.67 MIL/uL — ABNORMAL LOW (ref 3.87–5.11)
RDW: 17.2 % — ABNORMAL HIGH (ref 11.5–15.5)
WBC Count: 8.2 10*3/uL (ref 4.0–10.5)
nRBC: 0.4 % — ABNORMAL HIGH (ref 0.0–0.2)

## 2022-07-21 LAB — SAMPLE TO BLOOD BANK
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
## 2022-07-22 ENCOUNTER — Other Ambulatory Visit: Payer: Self-pay

## 2022-07-22 ENCOUNTER — Inpatient Hospital Stay: Payer: Medicare Other

## 2022-07-22 ENCOUNTER — Ambulatory Visit: Payer: Medicare Other

## 2022-07-22 VITALS — BP 137/68 | HR 82 | Temp 98.4°F | Resp 17

## 2022-07-22 DIAGNOSIS — D500 Iron deficiency anemia secondary to blood loss (chronic): Secondary | ICD-10-CM

## 2022-07-22 DIAGNOSIS — C189 Malignant neoplasm of colon, unspecified: Secondary | ICD-10-CM | POA: Diagnosis not present

## 2022-07-22 DIAGNOSIS — D649 Anemia, unspecified: Secondary | ICD-10-CM

## 2022-07-22 LAB — PREPARE RBC (CROSSMATCH)

## 2022-07-22 MED ORDER — SODIUM CHLORIDE 0.9 % IV SOLN
400.0000 mg | Freq: Once | INTRAVENOUS | Status: AC
  Administered 2022-07-22: 400 mg via INTRAVENOUS
  Filled 2022-07-22: qty 20

## 2022-07-22 MED ORDER — SODIUM CHLORIDE 0.9 % IV SOLN: Freq: Once | INTRAVENOUS | Status: AC

## 2022-07-22 MED ORDER — SODIUM CHLORIDE 0.9% IV SOLUTION
250.0000 mL | Freq: Once | INTRAVENOUS | Status: AC
  Administered 2022-07-22: 250 mL via INTRAVENOUS

## 2022-07-22 MED ORDER — LORATADINE 10 MG PO TABS
10.0000 mg | ORAL_TABLET | Freq: Every day | ORAL | Status: DC
  Administered 2022-07-22: 10 mg via ORAL
  Filled 2022-07-22: qty 1

## 2022-07-22 NOTE — Patient Instructions (Signed)
Iron Sucrose Injection What is this medication? IRON SUCROSE (EYE ern SOO krose) treats low levels of iron (iron deficiency anemia) in people with kidney disease. Iron is a mineral that plays an important role in making red blood cells, which carry oxygen from your lungs to the rest of your body. This medicine may be used for other purposes; ask your health care provider or pharmacist if you have questions. COMMON BRAND NAME(S): Venofer What should I tell my care team before I take this medication? They need to know if you have any of these conditions: Anemia not caused by low iron levels Heart disease High levels of iron in the blood Kidney disease Liver disease An unusual or allergic reaction to iron, other medications, foods, dyes, or preservatives Pregnant or trying to get pregnant Breast-feeding How should I use this medication? This medication is for infusion into a vein. It is given in a hospital or clinic setting. Talk to your care team about the use of this medication in children. While this medication may be prescribed for children as young as 2 years for selected conditions, precautions do apply. Overdosage: If you think you have taken too much of this medicine contact a poison control center or emergency room at once. NOTE: This medicine is only for you. Do not share this medicine with others. What if I miss a dose? It is important not to miss your dose. Call your care team if you are unable to keep an appointment. What may interact with this medication? Do not take this medication with any of the following: Deferoxamine Dimercaprol Other iron products This medication may also interact with the following: Chloramphenicol Deferasirox This list may not describe all possible interactions. Give your health care provider a list of all the medicines, herbs, non-prescription drugs, or dietary supplements you use. Also tell them if you smoke, drink alcohol, or use illegal drugs.  Some items may interact with your medicine. What should I watch for while using this medication? Visit your care team regularly. Tell your care team if your symptoms do not start to get better or if they get worse. You may need blood work done while you are taking this medication. You may need to follow a special diet. Talk to your care team. Foods that contain iron include: whole grains/cereals, dried fruits, beans, or peas, leafy green vegetables, and organ meats (liver, kidney). What side effects may I notice from receiving this medication? Side effects that you should report to your care team as soon as possible: Allergic reactions--skin rash, itching, hives, swelling of the face, lips, tongue, or throat Low blood pressure--dizziness, feeling faint or lightheaded, blurry vision Shortness of breath Side effects that usually do not require medical attention (report to your care team if they continue or are bothersome): Flushing Headache Joint pain Muscle pain Nausea Pain, redness, or irritation at injection site This list may not describe all possible side effects. Call your doctor for medical advice about side effects. You may report side effects to FDA at 1-800-FDA-1088. Where should I keep my medication? This medication is given in a hospital or clinic and will not be stored at home. NOTE: This sheet is a summary. It may not cover all possible information. If you have questions about this medicine, talk to your doctor, pharmacist, or health care provider.  2023 Elsevier/Gold Standard (2007-12-11 00:00:00)  Blood Transfusion, Adult, Care After After a blood transfusion, it is common to have: Bruising and soreness at the IV site. A  headache. Follow these instructions at home: Your doctor may give you more instructions. If you have problems, contact your doctor. Insertion site care     Follow instructions from your doctor about how to take care of your insertion site. This is where  an IV tube was put into your vein. Make sure you: Wash your hands with soap and water for at least 20 seconds before and after you change your bandage. If you cannot use soap and water, use hand sanitizer. Change your bandage as told by your doctor. Check your insertion site every day for signs of infection. Check for: Redness, swelling, or pain. Bleeding from the site. Warmth. Pus or a bad smell. General instructions Take over-the-counter and prescription medicines only as told by your doctor. Rest as told by your doctor. Go back to your normal activities as told by your doctor. Keep all follow-up visits. You may need to have tests at certain times to check your blood. Contact a doctor if: You have itching or red, swollen areas of skin (hives). You have a fever or chills. You have pain in the head, back, or chest. You feel worried or nervous (anxious). You feel weak after doing your normal activities. You have any of these problems at the insertion site: Redness, swelling, warmth, or pain. Bleeding that does not stop with pressure. Pus or a bad smell. If you received your blood transfusion in an outpatient setting, you will be told whom to contact to report any reactions. Get help right away if: You have signs of a serious reaction. This may be coming from an allergy or the body's defense system (immune system). Signs include: Trouble breathing or shortness of breath. Swelling of the face or feeling warm (flushed). A widespread rash. Dark pee (urine) or blood in the pee. Fast heartbeat. These symptoms may be an emergency. Get help right away. Call 911. Do not wait to see if the symptoms will go away. Do not drive yourself to the hospital. Summary Bruising and soreness at the IV site are common. Check your insertion site every day for signs of infection. Rest as told by your doctor. Go back to your normal activities as told by your doctor. Get help right away if you have signs  of a serious reaction. This information is not intended to replace advice given to you by your health care provider. Make sure you discuss any questions you have with your health care provider. Document Revised: 01/17/2022 Document Reviewed: 01/17/2022 Elsevier Patient Education  [place].

## 2022-07-22 NOTE — Progress Notes (Signed)
Per Dr. Burr Medico, pt. to receive IV Venofer and 1 unit of packed red blood cells today.

## 2022-07-23 ENCOUNTER — Encounter: Payer: Self-pay | Admitting: Podiatry

## 2022-07-23 ENCOUNTER — Ambulatory Visit (INDEPENDENT_AMBULATORY_CARE_PROVIDER_SITE_OTHER): Payer: Medicare Other | Admitting: Podiatry

## 2022-07-23 DIAGNOSIS — M79675 Pain in left toe(s): Secondary | ICD-10-CM | POA: Diagnosis not present

## 2022-07-23 DIAGNOSIS — M79674 Pain in right toe(s): Secondary | ICD-10-CM | POA: Diagnosis not present

## 2022-07-23 DIAGNOSIS — N179 Acute kidney failure, unspecified: Secondary | ICD-10-CM

## 2022-07-23 DIAGNOSIS — D689 Coagulation defect, unspecified: Secondary | ICD-10-CM

## 2022-07-23 DIAGNOSIS — B351 Tinea unguium: Secondary | ICD-10-CM

## 2022-07-23 NOTE — Progress Notes (Signed)
This patient returns to my office for at risk foot care.  This patient requires this care by a professional since this patient will be at risk due to having  kidney disease and coagulation defect. Patient is taking eliquis. This patient is unable to cut nails herself since the patient cannot reach her nails.These nails are painful walking and wearing shoes.  This patient presents for at risk foot care today.  General Appearance  Alert, conversant and in no acute stress.  Vascular  Dorsalis pedis and posterior tibial  pulses are palpable  bilaterally.  Capillary return is within normal limits  bilaterally. Temperature is within normal limits  bilaterally.  Neurologic  Senn-Weinstein monofilament wire test within normal limits  bilaterally. Muscle power within normal limits bilaterally.  Nails Thick disfigured discolored nails with subungual debris  from hallux to fifth toes bilaterally. No evidence of bacterial infection or drainage bilaterally.  Orthopedic  No limitations of motion  feet .  No crepitus or effusions noted.  No bony pathology or digital deformities noted.  Skin  normotropic skin with no porokeratosis noted bilaterally.  No signs of infections or ulcers noted.     Onychomycosis  Pain in right toes  Pain in left toes  Consent was obtained for treatment procedures.   Mechanical debridement of nails 1-5  bilaterally performed with a nail nipper.  Filed with dremel without incident.    Return office visit    3 months                  Told patient to return for periodic foot care and evaluation due to potential at risk complications.   Gardiner Barefoot DPM

## 2022-07-25 LAB — TYPE AND SCREEN
ABO/RH(D): A POS
Antibody Screen: NEGATIVE
Unit division: 0
Unit division: 0

## 2022-07-25 LAB — BPAM RBC
Blood Product Expiration Date: 202310102359
Blood Product Expiration Date: 202310102359
ISSUE DATE / TIME: 202309191545
Unit Type and Rh: 6200
Unit Type and Rh: 6200

## 2022-08-11 ENCOUNTER — Inpatient Hospital Stay: Payer: Medicare Other

## 2022-08-11 ENCOUNTER — Inpatient Hospital Stay: Payer: Medicare Other | Attending: Hematology

## 2022-08-11 ENCOUNTER — Other Ambulatory Visit: Payer: Self-pay

## 2022-08-11 VITALS — BP 120/76 | HR 99 | Temp 98.4°F | Resp 18 | Ht 63.0 in

## 2022-08-11 DIAGNOSIS — Z79899 Other long term (current) drug therapy: Secondary | ICD-10-CM | POA: Diagnosis not present

## 2022-08-11 DIAGNOSIS — D500 Iron deficiency anemia secondary to blood loss (chronic): Secondary | ICD-10-CM | POA: Insufficient documentation

## 2022-08-11 DIAGNOSIS — D649 Anemia, unspecified: Secondary | ICD-10-CM

## 2022-08-11 DIAGNOSIS — C189 Malignant neoplasm of colon, unspecified: Secondary | ICD-10-CM

## 2022-08-11 LAB — CMP (CANCER CENTER ONLY)
ALT: 14 U/L (ref 0–44)
AST: 15 U/L (ref 15–41)
Albumin: 2.8 g/dL — ABNORMAL LOW (ref 3.5–5.0)
Alkaline Phosphatase: 132 U/L — ABNORMAL HIGH (ref 38–126)
Anion gap: 5 (ref 5–15)
BUN: 22 mg/dL (ref 8–23)
CO2: 24 mmol/L (ref 22–32)
Calcium: 7.9 mg/dL — ABNORMAL LOW (ref 8.9–10.3)
Chloride: 110 mmol/L (ref 98–111)
Creatinine: 1.82 mg/dL — ABNORMAL HIGH (ref 0.44–1.00)
GFR, Estimated: 30 mL/min — ABNORMAL LOW (ref >60)
Glucose, Bld: 133 mg/dL — ABNORMAL HIGH (ref 70–99)
Potassium: 3.9 mmol/L (ref 3.5–5.1)
Sodium: 139 mmol/L (ref 135–145)
Total Bilirubin: 0.3 mg/dL (ref 0.3–1.2)
Total Protein: 6.3 g/dL — ABNORMAL LOW (ref 6.5–8.1)

## 2022-08-11 LAB — CBC WITH DIFFERENTIAL (CANCER CENTER ONLY)
Abs Immature Granulocytes: 0.03 10*3/uL (ref 0.00–0.07)
Basophils Absolute: 0 10*3/uL (ref 0.0–0.1)
Basophils Relative: 1 %
Eosinophils Absolute: 0.1 10*3/uL (ref 0.0–0.5)
Eosinophils Relative: 2 %
HCT: 28.3 % — ABNORMAL LOW (ref 36.0–46.0)
Hemoglobin: 9 g/dL — ABNORMAL LOW (ref 12.0–15.0)
Immature Granulocytes: 1 %
Lymphocytes Relative: 29 %
Lymphs Abs: 1.8 10*3/uL (ref 0.7–4.0)
MCH: 30.3 pg (ref 26.0–34.0)
MCHC: 31.8 g/dL (ref 30.0–36.0)
MCV: 95.3 fL (ref 80.0–100.0)
Monocytes Absolute: 0.4 10*3/uL (ref 0.1–1.0)
Monocytes Relative: 6 %
Neutro Abs: 3.7 10*3/uL (ref 1.7–7.7)
Neutrophils Relative %: 61 %
Platelet Count: 235 10*3/uL (ref 150–400)
RBC: 2.97 MIL/uL — ABNORMAL LOW (ref 3.87–5.11)
RDW: 19.8 % — ABNORMAL HIGH (ref 11.5–15.5)
WBC Count: 6 10*3/uL (ref 4.0–10.5)
nRBC: 0 % (ref 0.0–0.2)

## 2022-08-11 LAB — TYPE AND SCREEN
ABO/RH(D): A POS
Antibody Screen: NEGATIVE

## 2022-08-11 MED ORDER — SODIUM CHLORIDE 0.9 % IV SOLN
400.0000 mg | Freq: Once | INTRAVENOUS | Status: AC
  Administered 2022-08-11: 400 mg via INTRAVENOUS
  Filled 2022-08-11: qty 20

## 2022-08-11 MED ORDER — SODIUM CHLORIDE 0.9 % IV SOLN: Freq: Once | INTRAVENOUS | Status: AC

## 2022-08-11 MED ORDER — LORATADINE 10 MG PO TABS
10.0000 mg | ORAL_TABLET | Freq: Every day | ORAL | Status: DC
  Administered 2022-08-11: 10 mg via ORAL
  Filled 2022-08-11: qty 1

## 2022-08-11 NOTE — Patient Instructions (Signed)

## 2022-09-01 ENCOUNTER — Other Ambulatory Visit: Payer: Self-pay

## 2022-09-01 ENCOUNTER — Inpatient Hospital Stay: Payer: Medicare Other

## 2022-09-01 DIAGNOSIS — C787 Secondary malignant neoplasm of liver and intrahepatic bile duct: Secondary | ICD-10-CM

## 2022-09-01 DIAGNOSIS — D500 Iron deficiency anemia secondary to blood loss (chronic): Secondary | ICD-10-CM | POA: Diagnosis not present

## 2022-09-01 LAB — CBC WITH DIFFERENTIAL (CANCER CENTER ONLY)
Abs Immature Granulocytes: 0.06 10*3/uL (ref 0.00–0.07)
Basophils Absolute: 0 10*3/uL (ref 0.0–0.1)
Basophils Relative: 1 %
Eosinophils Absolute: 0.1 10*3/uL (ref 0.0–0.5)
Eosinophils Relative: 1 %
HCT: 27.9 % — ABNORMAL LOW (ref 36.0–46.0)
Hemoglobin: 9 g/dL — ABNORMAL LOW (ref 12.0–15.0)
Immature Granulocytes: 1 %
Lymphocytes Relative: 31 %
Lymphs Abs: 2.1 10*3/uL (ref 0.7–4.0)
MCH: 31.9 pg (ref 26.0–34.0)
MCHC: 32.3 g/dL (ref 30.0–36.0)
MCV: 98.9 fL (ref 80.0–100.0)
Monocytes Absolute: 0.5 10*3/uL (ref 0.1–1.0)
Monocytes Relative: 8 %
Neutro Abs: 3.9 10*3/uL (ref 1.7–7.7)
Neutrophils Relative %: 58 %
Platelet Count: 212 10*3/uL (ref 150–400)
RBC: 2.82 MIL/uL — ABNORMAL LOW (ref 3.87–5.11)
RDW: 20.8 % — ABNORMAL HIGH (ref 11.5–15.5)
WBC Count: 6.7 10*3/uL (ref 4.0–10.5)
nRBC: 0 % (ref 0.0–0.2)

## 2022-09-01 LAB — CMP (CANCER CENTER ONLY)
ALT: 12 U/L (ref 0–44)
AST: 15 U/L (ref 15–41)
Albumin: 3 g/dL — ABNORMAL LOW (ref 3.5–5.0)
Alkaline Phosphatase: 114 U/L (ref 38–126)
Anion gap: 6 (ref 5–15)
BUN: 28 mg/dL — ABNORMAL HIGH (ref 8–23)
CO2: 24 mmol/L (ref 22–32)
Calcium: 8.5 mg/dL — ABNORMAL LOW (ref 8.9–10.3)
Chloride: 106 mmol/L (ref 98–111)
Creatinine: 1.59 mg/dL — ABNORMAL HIGH (ref 0.44–1.00)
GFR, Estimated: 35 mL/min — ABNORMAL LOW (ref >60)
Glucose, Bld: 99 mg/dL (ref 70–99)
Potassium: 4.6 mmol/L (ref 3.5–5.1)
Sodium: 136 mmol/L (ref 135–145)
Total Bilirubin: 0.4 mg/dL (ref 0.3–1.2)
Total Protein: 6.6 g/dL (ref 6.5–8.1)

## 2022-09-02 ENCOUNTER — Telehealth: Payer: Self-pay | Admitting: *Deleted

## 2022-09-02 NOTE — Telephone Encounter (Signed)
-----   Message from Truitt Merle, MD sent at 09/02/2022 11:33 AM EDT ----- Please let pt know her lab results, H/H stable, no need blood transfusion for now, no other new concerns. Thanks   Truitt Merle

## 2022-09-02 NOTE — Telephone Encounter (Signed)
LM with note below. To call if she has any questions

## 2022-09-12 ENCOUNTER — Telehealth: Payer: Self-pay | Admitting: Hematology

## 2022-09-12 NOTE — Telephone Encounter (Signed)
Called patient per 11/9 in basket. Left voicemail.

## 2022-09-22 ENCOUNTER — Other Ambulatory Visit: Payer: Self-pay

## 2022-09-22 ENCOUNTER — Inpatient Hospital Stay: Payer: Medicare Other | Attending: Hematology | Admitting: Adult Health

## 2022-09-22 ENCOUNTER — Inpatient Hospital Stay: Payer: Medicare Other | Admitting: Hematology

## 2022-09-22 ENCOUNTER — Encounter: Payer: Self-pay | Admitting: Adult Health

## 2022-09-22 ENCOUNTER — Inpatient Hospital Stay: Payer: Medicare Other

## 2022-09-22 VITALS — BP 100/62 | HR 88 | Temp 97.9°F | Resp 16 | Ht 63.0 in | Wt 194.6 lb

## 2022-09-22 DIAGNOSIS — C7800 Secondary malignant neoplasm of unspecified lung: Secondary | ICD-10-CM | POA: Insufficient documentation

## 2022-09-22 DIAGNOSIS — C787 Secondary malignant neoplasm of liver and intrahepatic bile duct: Secondary | ICD-10-CM | POA: Diagnosis not present

## 2022-09-22 DIAGNOSIS — C189 Malignant neoplasm of colon, unspecified: Secondary | ICD-10-CM

## 2022-09-22 DIAGNOSIS — I10 Essential (primary) hypertension: Secondary | ICD-10-CM | POA: Insufficient documentation

## 2022-09-22 DIAGNOSIS — D500 Iron deficiency anemia secondary to blood loss (chronic): Secondary | ICD-10-CM | POA: Insufficient documentation

## 2022-09-22 LAB — CBC WITH DIFFERENTIAL (CANCER CENTER ONLY)
Abs Immature Granulocytes: 0.06 10*3/uL (ref 0.00–0.07)
Basophils Absolute: 0.1 10*3/uL (ref 0.0–0.1)
Basophils Relative: 1 %
Eosinophils Absolute: 0.2 10*3/uL (ref 0.0–0.5)
Eosinophils Relative: 2 %
HCT: 25.3 % — ABNORMAL LOW (ref 36.0–46.0)
Hemoglobin: 7.9 g/dL — ABNORMAL LOW (ref 12.0–15.0)
Immature Granulocytes: 1 %
Lymphocytes Relative: 31 %
Lymphs Abs: 2.7 10*3/uL (ref 0.7–4.0)
MCH: 32.1 pg (ref 26.0–34.0)
MCHC: 31.2 g/dL (ref 30.0–36.0)
MCV: 102.8 fL — ABNORMAL HIGH (ref 80.0–100.0)
Monocytes Absolute: 0.6 10*3/uL (ref 0.1–1.0)
Monocytes Relative: 7 %
Neutro Abs: 5 10*3/uL (ref 1.7–7.7)
Neutrophils Relative %: 58 %
Platelet Count: 261 10*3/uL (ref 150–400)
RBC: 2.46 MIL/uL — ABNORMAL LOW (ref 3.87–5.11)
RDW: 19 % — ABNORMAL HIGH (ref 11.5–15.5)
WBC Count: 8.5 10*3/uL (ref 4.0–10.5)
nRBC: 0.4 % — ABNORMAL HIGH (ref 0.0–0.2)

## 2022-09-22 LAB — CMP (CANCER CENTER ONLY)
ALT: 10 U/L (ref 0–44)
AST: 11 U/L — ABNORMAL LOW (ref 15–41)
Albumin: 3.5 g/dL (ref 3.5–5.0)
Alkaline Phosphatase: 99 U/L (ref 38–126)
Anion gap: 10 (ref 5–15)
BUN: 46 mg/dL — ABNORMAL HIGH (ref 8–23)
CO2: 20 mmol/L — ABNORMAL LOW (ref 22–32)
Calcium: 8.6 mg/dL — ABNORMAL LOW (ref 8.9–10.3)
Chloride: 107 mmol/L (ref 98–111)
Creatinine: 2.28 mg/dL — ABNORMAL HIGH (ref 0.44–1.00)
GFR, Estimated: 23 mL/min — ABNORMAL LOW (ref >60)
Glucose, Bld: 97 mg/dL (ref 70–99)
Potassium: 4.2 mmol/L (ref 3.5–5.1)
Sodium: 137 mmol/L (ref 135–145)
Total Bilirubin: 0.3 mg/dL (ref 0.3–1.2)
Total Protein: 6.7 g/dL (ref 6.5–8.1)
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
## 2022-09-23 ENCOUNTER — Encounter: Payer: Self-pay | Admitting: Hematology

## 2022-09-23 ENCOUNTER — Encounter: Payer: Self-pay | Admitting: Adult Health

## 2022-09-23 ENCOUNTER — Other Ambulatory Visit: Payer: Self-pay

## 2022-09-23 ENCOUNTER — Telehealth: Payer: Self-pay | Admitting: Adult Health

## 2022-09-23 MED ORDER — TIZANIDINE HCL 2 MG PO TABS: 2.0000 mg | ORAL_TABLET | Freq: Three times a day (TID) | ORAL | 1 refills | Status: DC | PRN

## 2022-09-23 NOTE — Telephone Encounter (Signed)
Scheduled appointment per 11/20 los. Talked to the patients sister and she is aware of the upcoming appointments.

## 2022-09-23 NOTE — Assessment & Plan Note (Signed)
[name] is a 66 year old woman with history of stage IV colon cancer metastatic to the liver.  She is being followed with supportive care only.  She receives intermittent blood transfusions and did not have a lab appointment with today's visit therefore we will add lab testing after the visit today.    I will refill her tizanidine today for her leg pain.  She is not having any pain in her abdomen from her liver metastases.  We will continue to follow her hemoglobin closely and transfuse as indicated.  We discussed her goals of care and advance care planning today.  We discussed advanced directives in detail and I gave her the paperwork and information on our advanced directives clinic which she is motivated to register for.  She will continue to undergo lab testing every 3 weeks and she will see [place] back in 9 weeks.

## 2022-09-26 ENCOUNTER — Telehealth: Payer: Self-pay | Admitting: Adult Health

## 2022-09-26 ENCOUNTER — Telehealth: Payer: Self-pay | Admitting: *Deleted

## 2022-09-26 NOTE — Telephone Encounter (Signed)
Scheduled appointment per 11/22 staff message. Left voicemail.

## 2022-09-26 NOTE — Telephone Encounter (Addendum)
This RN called pt's number and spoke with her sister and caregiver- she states pt is having symptoms of low heme and feels pt would benefit from transfusion prior to 12/11 appt (next scheduled appt).  This RN sent scheduling request for lab and transfusion for next week.  This message will be forwarded to providers for review and entering of blood orders.   ----- Message from Gardenia Phlegm, NP sent at 09/26/2022 10:18 AM EST ----- Please arrange for th is patient as recommended by YF below.  ----- Message ----- From: Truitt Merle, MD Sent: 09/26/2022   8:50 AM EST To: Gardenia Phlegm, NP  Sorry that I did not see the message until now. Please make sure she has sample to blood bank and 4hr blood transfusion appointment next time she is here in 2-3 weeks, thank  Krista Blue  ----- Message ----- From: Gardenia Phlegm, NP Sent: 09/22/2022   1:26 PM EST To: Truitt Merle, MD  Hey there, are you typically transfusing her at less than 8 or less than 7?  Happy to arrange whent he time is right.   ----- Message ----- From: Buel Ream, Lab In [place] Sent: 09/22/2022  12:21 PM EST To: Gardenia Phlegm, NP

## 2022-09-27 IMAGING — CT CT CERVICAL SPINE W/O CM
3 of 4 series · 12 of 33 positions shown, 14 images · non-contrast
Comparison: Cervical spine CT 12/04/2021 and cervical spine MRI
12/12/2021

CLINICAL DATA: Follow-up C2 fracture.



[Series 4: c_spine 2.0 st · axial · 0.33mm/px · z∈[-261,-117]mm · 4 of 110 slices shown, 5 images]
[im 19/110  soft-tissue]
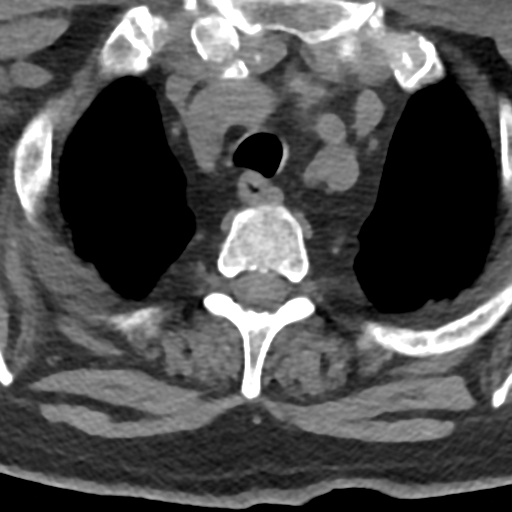
[im 19/110  bone]
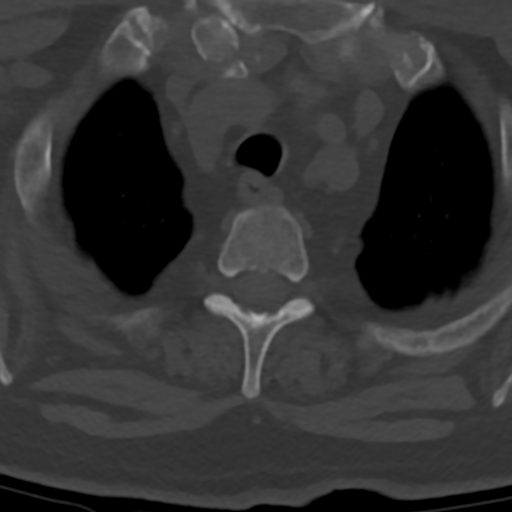
[im 37/110  bone]
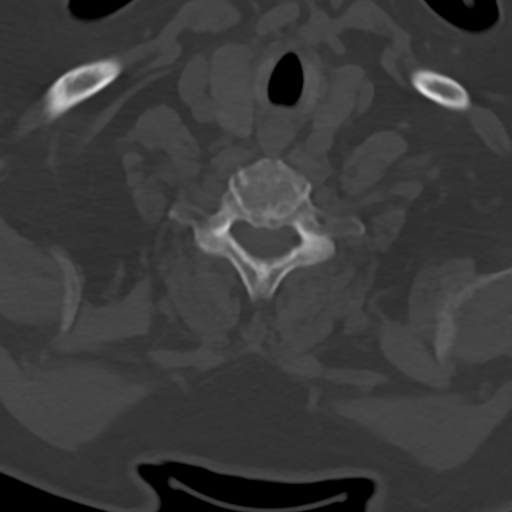
[im 73/110  bone]
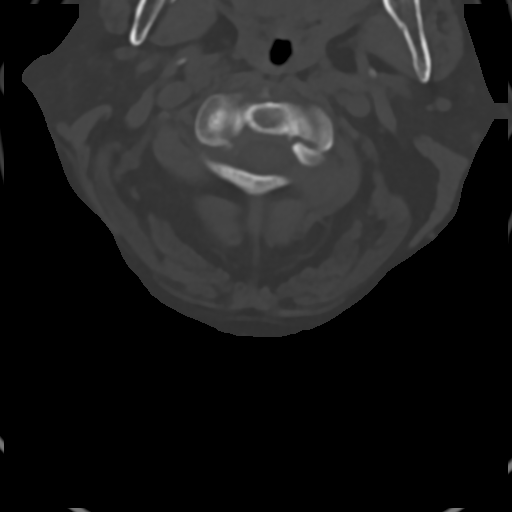
[im 91/110  bone]
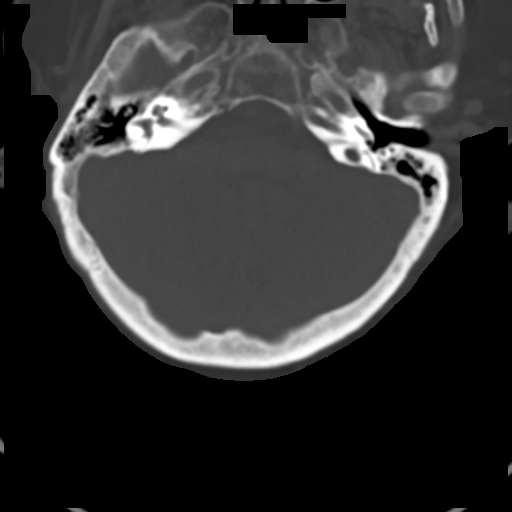

[Series 6: c_spine 2.0 sag bone · sagittal · 0.32mm/px · 5 of 61 slices shown, 6 images]
[im 21/61  bone]
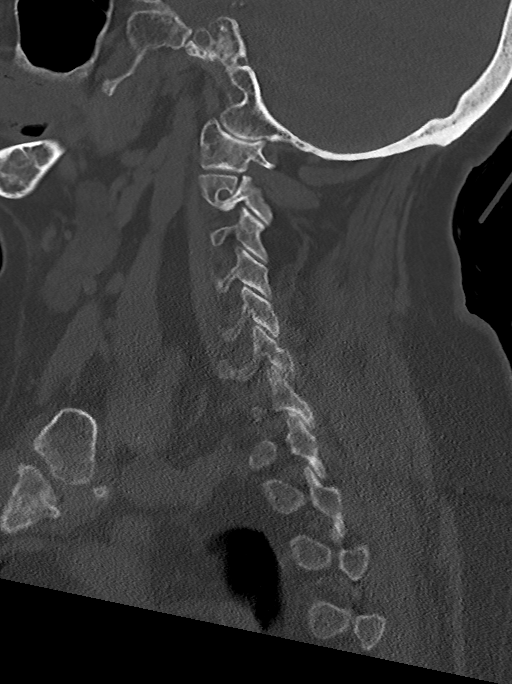
[im 26/61  bone]
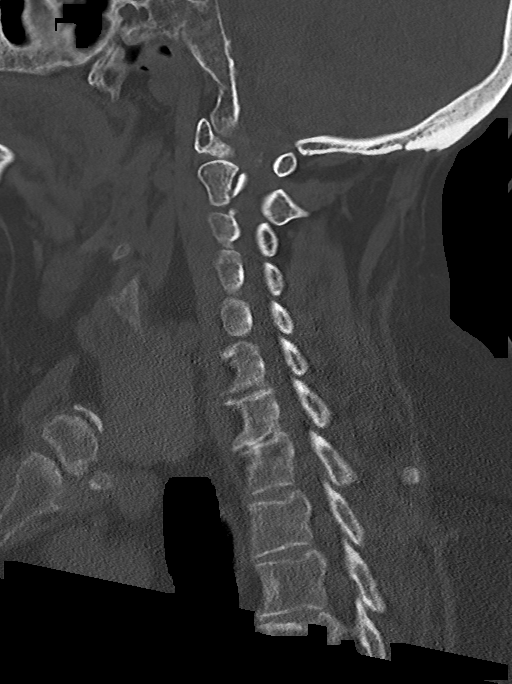
[im 31/61  soft-tissue]
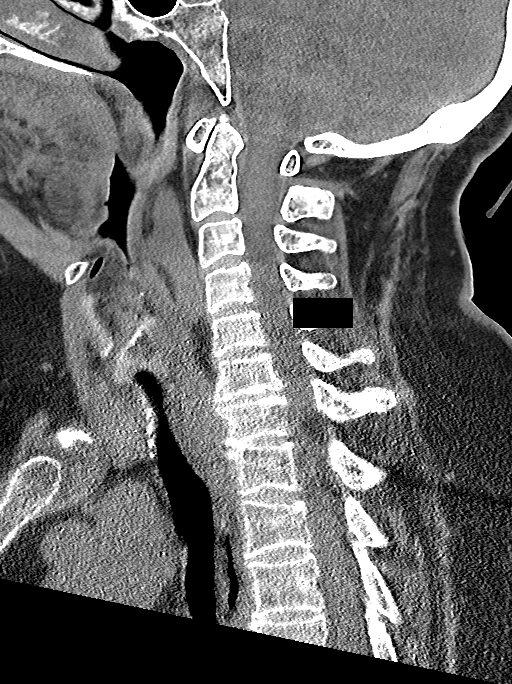
[im 31/61  bone]
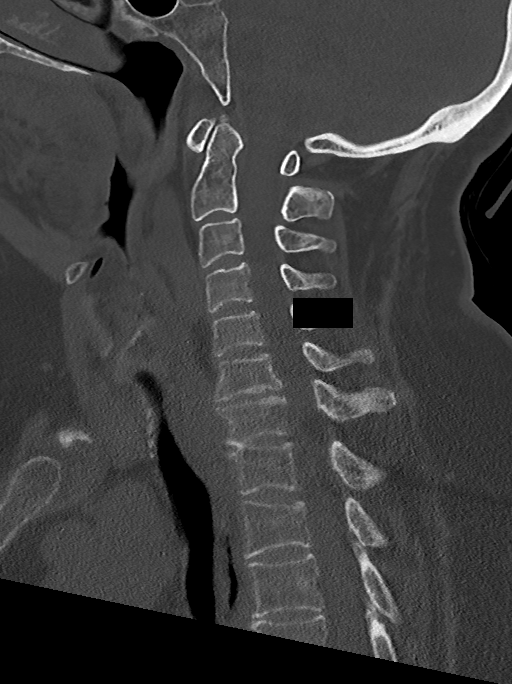
[im 36/61  bone]
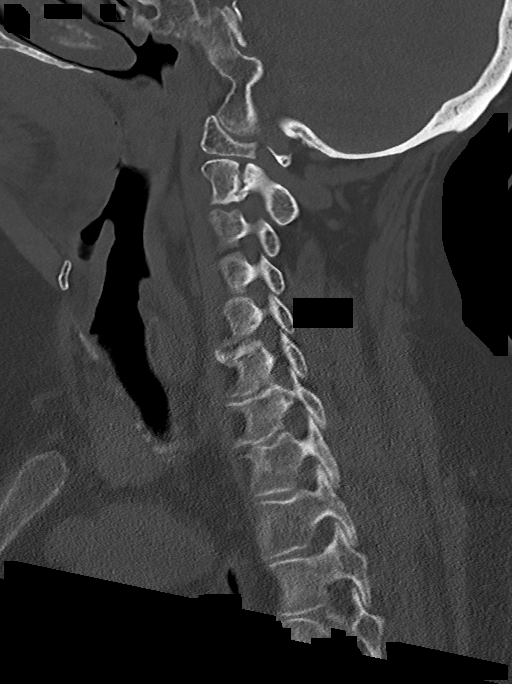
[im 41/61  bone]
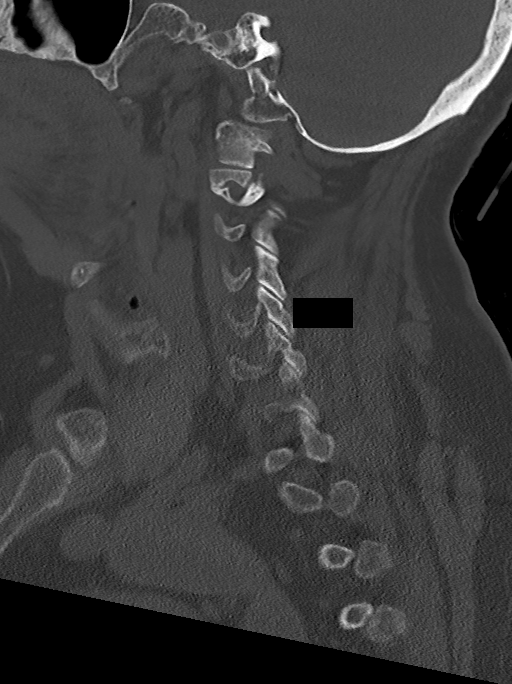

[Series 7: c_spine 2.0 cor bone · coronal · 0.24mm/px · 3 of 61 slices shown]
[im 13/61  bone]
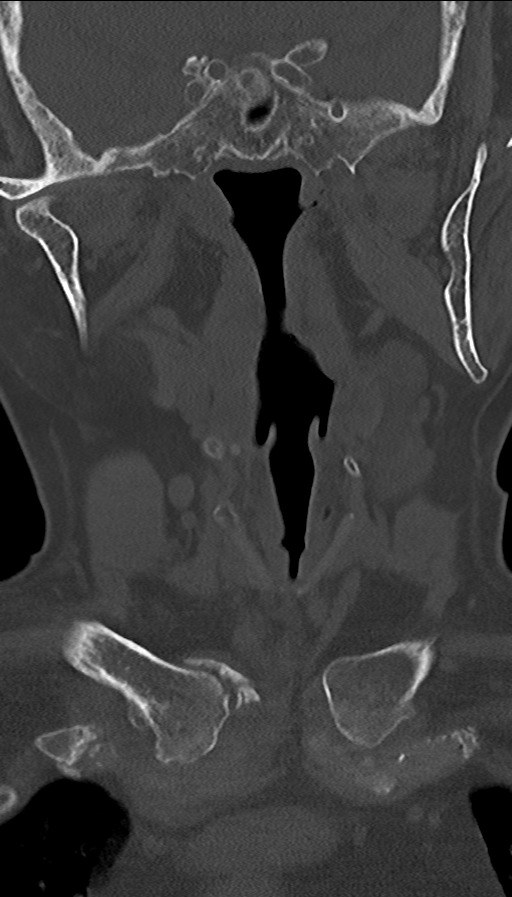
[im 25/61  bone]
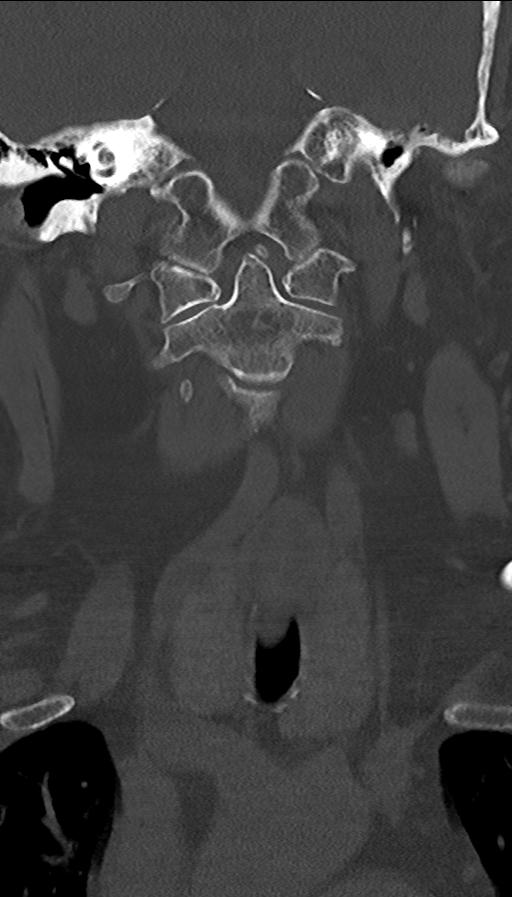
[im 37/61  bone]
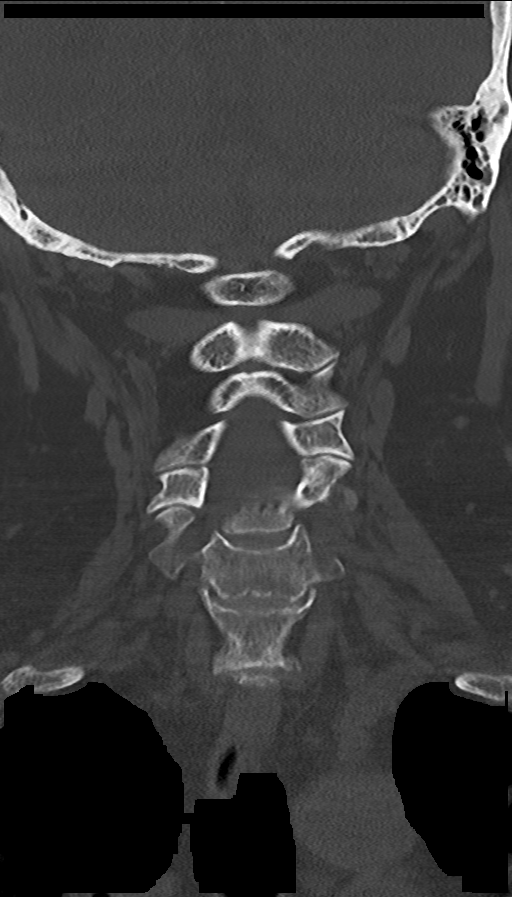

[12 of 33 positions shown; findings below may reference images not displayed]

FINDINGS: Alignment: Cervical spine straightening.  No listhesis.

Skull base and vertebrae: Decreased distraction bilateral C2 pars
interarticularis fractures which extend into the posterior vertebral
body with a small amount of bridging bone bilaterally. Greater
interval healing of some of the fracture components involving the
bilateral C2 transverse foramina. No residual posterior widening of
the C2-3 disc space. No new fracture or suspicious osseous lesion.

Soft tissues and spinal canal: No prevertebral fluid or swelling. No
visible canal hematoma.

Disc levels: Similar appearance of cervical disc degeneration,
greatest at C6-7 where a left-sided posterior disc osteophyte
complex result in mild spinal stenosis and mild left neural
foraminal stenosis.

Upper chest: No apical lung consolidation or mass. Mild mosaic
attenuation in the upper lobes, nonspecific though could reflect air
trapping.

Other: None.
IMPRESSION: Partial healing of C2 hangman's fracture with decreased distraction
and some bridging bone bilaterally.

## 2022-09-29 ENCOUNTER — Inpatient Hospital Stay: Payer: Medicare Other

## 2022-09-29 ENCOUNTER — Other Ambulatory Visit: Payer: Self-pay

## 2022-09-29 DIAGNOSIS — C787 Secondary malignant neoplasm of liver and intrahepatic bile duct: Secondary | ICD-10-CM

## 2022-09-29 DIAGNOSIS — D500 Iron deficiency anemia secondary to blood loss (chronic): Secondary | ICD-10-CM

## 2022-09-29 DIAGNOSIS — D649 Anemia, unspecified: Secondary | ICD-10-CM

## 2022-09-29 LAB — CBC WITH DIFFERENTIAL (CANCER CENTER ONLY)
Abs Immature Granulocytes: 0.1 10*3/uL — ABNORMAL HIGH (ref 0.00–0.07)
Basophils Absolute: 0.1 10*3/uL (ref 0.0–0.1)
Basophils Relative: 1 %
Eosinophils Absolute: 0.2 10*3/uL (ref 0.0–0.5)
Eosinophils Relative: 2 %
HCT: 25 % — ABNORMAL LOW (ref 36.0–46.0)
Hemoglobin: 7.9 g/dL — ABNORMAL LOW (ref 12.0–15.0)
Immature Granulocytes: 1 %
Lymphocytes Relative: 19 %
Lymphs Abs: 1.5 10*3/uL (ref 0.7–4.0)
MCH: 32.5 pg (ref 26.0–34.0)
MCHC: 31.6 g/dL (ref 30.0–36.0)
MCV: 102.9 fL — ABNORMAL HIGH (ref 80.0–100.0)
Monocytes Absolute: 0.5 10*3/uL (ref 0.1–1.0)
Monocytes Relative: 6 %
Neutro Abs: 5.6 10*3/uL (ref 1.7–7.7)
Neutrophils Relative %: 71 %
Platelet Count: 257 10*3/uL (ref 150–400)
RBC: 2.43 MIL/uL — ABNORMAL LOW (ref 3.87–5.11)
RDW: 19.6 % — ABNORMAL HIGH (ref 11.5–15.5)
WBC Count: 7.8 10*3/uL (ref 4.0–10.5)
nRBC: 0.6 % — ABNORMAL HIGH (ref 0.0–0.2)

## 2022-09-29 LAB — PREPARE RBC (CROSSMATCH)

## 2022-09-29 LAB — CMP (CANCER CENTER ONLY)
ALT: 11 U/L (ref 0–44)
AST: 13 U/L — ABNORMAL LOW (ref 15–41)
Albumin: 3.4 g/dL — ABNORMAL LOW (ref 3.5–5.0)
Alkaline Phosphatase: 108 U/L (ref 38–126)
Anion gap: 7 (ref 5–15)
BUN: 33 mg/dL — ABNORMAL HIGH (ref 8–23)
CO2: 22 mmol/L (ref 22–32)
Calcium: 8.8 mg/dL — ABNORMAL LOW (ref 8.9–10.3)
Chloride: 109 mmol/L (ref 98–111)
Creatinine: 1.97 mg/dL — ABNORMAL HIGH (ref 0.44–1.00)
GFR, Estimated: 27 mL/min — ABNORMAL LOW (ref >60)
Glucose, Bld: 137 mg/dL — ABNORMAL HIGH (ref 70–99)
Potassium: 4 mmol/L (ref 3.5–5.1)
Sodium: 138 mmol/L (ref 135–145)
Total Bilirubin: 0.3 mg/dL (ref 0.3–1.2)
Total Protein: 6.5 g/dL (ref 6.5–8.1)

## 2022-09-29 LAB — SAMPLE TO BLOOD BANK

## 2022-10-01 ENCOUNTER — Ambulatory Visit: Payer: Medicare Other

## 2022-10-01 ENCOUNTER — Inpatient Hospital Stay: Payer: Medicare Other

## 2022-10-01 DIAGNOSIS — D649 Anemia, unspecified: Secondary | ICD-10-CM

## 2022-10-01 DIAGNOSIS — D500 Iron deficiency anemia secondary to blood loss (chronic): Secondary | ICD-10-CM

## 2022-10-01 MED ORDER — SODIUM CHLORIDE 0.9% IV SOLUTION
250.0000 mL | Freq: Once | INTRAVENOUS | Status: AC
  Administered 2022-10-01: 250 mL via INTRAVENOUS

## 2022-10-01 MED ORDER — DIPHENHYDRAMINE HCL 25 MG PO CAPS
25.0000 mg | ORAL_CAPSULE | Freq: Once | ORAL | Status: AC
  Administered 2022-10-01: 25 mg via ORAL
  Filled 2022-10-01: qty 1

## 2022-10-01 MED ORDER — ACETAMINOPHEN 325 MG PO TABS
650.0000 mg | ORAL_TABLET | Freq: Once | ORAL | Status: AC
  Administered 2022-10-01: 650 mg via ORAL
  Filled 2022-10-01: qty 2

## 2022-10-01 NOTE — Patient Instructions (Signed)

## 2022-10-02 LAB — BPAM RBC
Blood Product Expiration Date: 202312182359
ISSUE DATE / TIME: 202311290853
Unit Type and Rh: 6200

## 2022-10-02 LAB — TYPE AND SCREEN
ABO/RH(D): A POS
Antibody Screen: NEGATIVE
Unit division: 0

## 2022-10-13 ENCOUNTER — Other Ambulatory Visit: Payer: Self-pay | Admitting: *Deleted

## 2022-10-13 ENCOUNTER — Inpatient Hospital Stay: Payer: Medicare Other | Attending: Hematology

## 2022-10-13 ENCOUNTER — Other Ambulatory Visit: Payer: Self-pay

## 2022-10-13 DIAGNOSIS — D500 Iron deficiency anemia secondary to blood loss (chronic): Secondary | ICD-10-CM | POA: Insufficient documentation

## 2022-10-13 DIAGNOSIS — C787 Secondary malignant neoplasm of liver and intrahepatic bile duct: Secondary | ICD-10-CM

## 2022-10-13 LAB — CBC WITH DIFFERENTIAL (CANCER CENTER ONLY)
Abs Immature Granulocytes: 0.02 10*3/uL (ref 0.00–0.07)
Basophils Absolute: 0.1 10*3/uL (ref 0.0–0.1)
Basophils Relative: 1 %
Eosinophils Absolute: 0.1 10*3/uL (ref 0.0–0.5)
Eosinophils Relative: 2 %
HCT: 27.9 % — ABNORMAL LOW (ref 36.0–46.0)
Hemoglobin: 8.7 g/dL — ABNORMAL LOW (ref 12.0–15.0)
Immature Granulocytes: 0 %
Lymphocytes Relative: 27 %
Lymphs Abs: 2.1 10*3/uL (ref 0.7–4.0)
MCH: 31.4 pg (ref 26.0–34.0)
MCHC: 31.2 g/dL (ref 30.0–36.0)
MCV: 100.7 fL — ABNORMAL HIGH (ref 80.0–100.0)
Monocytes Absolute: 0.5 10*3/uL (ref 0.1–1.0)
Monocytes Relative: 7 %
Neutro Abs: 5 10*3/uL (ref 1.7–7.7)
Neutrophils Relative %: 63 %
Platelet Count: 227 10*3/uL (ref 150–400)
RBC: 2.77 MIL/uL — ABNORMAL LOW (ref 3.87–5.11)
RDW: 17.7 % — ABNORMAL HIGH (ref 11.5–15.5)
WBC Count: 7.8 10*3/uL (ref 4.0–10.5)
nRBC: 0 % (ref 0.0–0.2)

## 2022-10-13 LAB — CMP (CANCER CENTER ONLY)
ALT: 10 U/L (ref 0–44)
AST: 16 U/L (ref 15–41)
Albumin: 3.2 g/dL — ABNORMAL LOW (ref 3.5–5.0)
Alkaline Phosphatase: 117 U/L (ref 38–126)
Anion gap: 6 (ref 5–15)
BUN: 22 mg/dL (ref 8–23)
CO2: 24 mmol/L (ref 22–32)
Calcium: 8.9 mg/dL (ref 8.9–10.3)
Chloride: 108 mmol/L (ref 98–111)
Creatinine: 1.67 mg/dL — ABNORMAL HIGH (ref 0.44–1.00)
GFR, Estimated: 33 mL/min — ABNORMAL LOW (ref >60)
Glucose, Bld: 102 mg/dL — ABNORMAL HIGH (ref 70–99)
Potassium: 4.8 mmol/L (ref 3.5–5.1)
Sodium: 138 mmol/L (ref 135–145)
Total Bilirubin: 0.4 mg/dL (ref 0.3–1.2)
Total Protein: 6.4 g/dL — ABNORMAL LOW (ref 6.5–8.1)

## 2022-10-13 LAB — SAMPLE TO BLOOD BANK

## 2022-10-24 ENCOUNTER — Encounter: Payer: Self-pay | Admitting: Podiatry

## 2022-10-24 ENCOUNTER — Ambulatory Visit (INDEPENDENT_AMBULATORY_CARE_PROVIDER_SITE_OTHER): Payer: Medicare Other | Admitting: Podiatry

## 2022-10-24 DIAGNOSIS — M79674 Pain in right toe(s): Secondary | ICD-10-CM

## 2022-10-24 DIAGNOSIS — D689 Coagulation defect, unspecified: Secondary | ICD-10-CM

## 2022-10-24 DIAGNOSIS — B351 Tinea unguium: Secondary | ICD-10-CM | POA: Diagnosis not present

## 2022-10-24 DIAGNOSIS — N179 Acute kidney failure, unspecified: Secondary | ICD-10-CM

## 2022-10-24 DIAGNOSIS — M79675 Pain in left toe(s): Secondary | ICD-10-CM | POA: Diagnosis not present

## 2022-10-24 NOTE — Progress Notes (Signed)
This patient returns to my office for at risk foot care.  This patient requires this care by a professional since this patient will be at risk due to having  kidney disease and coagulation defect. Patient is taking eliquis. This patient is unable to cut nails herself since the patient cannot reach her nails.These nails are painful walking and wearing shoes.  This patient presents for at risk foot care today.  General Appearance  Alert, conversant and in no acute stress.  Vascular  Dorsalis pedis and posterior tibial  pulses are palpable  bilaterally.  Capillary return is within normal limits  bilaterally. Temperature is within normal limits  bilaterally.  Neurologic  Senn-Weinstein monofilament wire test within normal limits  bilaterally. Muscle power within normal limits bilaterally.  Nails Thick disfigured discolored nails with subungual debris  from hallux to fifth toes bilaterally. No evidence of bacterial infection or drainage bilaterally.  Orthopedic  No limitations of motion  feet .  No crepitus or effusions noted.  No bony pathology or digital deformities noted.  Skin  normotropic skin with no porokeratosis noted bilaterally.  No signs of infections or ulcers noted.     Onychomycosis  Pain in right toes  Pain in left toes  Consent was obtained for treatment procedures.   Mechanical debridement of nails 1-5  bilaterally performed with a nail nipper.  Filed with dremel without incident. Wore nail polish on her nails so I was limited in dremel tool usage.   Return office visit    3 months                  Told patient to return for periodic foot care and evaluation due to potential at risk complications.   Gardiner Barefoot DPM

## 2022-11-04 ENCOUNTER — Other Ambulatory Visit: Payer: Self-pay

## 2022-11-04 ENCOUNTER — Inpatient Hospital Stay: Payer: 59 | Attending: Hematology

## 2022-11-04 DIAGNOSIS — D500 Iron deficiency anemia secondary to blood loss (chronic): Secondary | ICD-10-CM

## 2022-11-04 DIAGNOSIS — C189 Malignant neoplasm of colon, unspecified: Secondary | ICD-10-CM

## 2022-11-04 DIAGNOSIS — G629 Polyneuropathy, unspecified: Secondary | ICD-10-CM | POA: Insufficient documentation

## 2022-11-04 DIAGNOSIS — I10 Essential (primary) hypertension: Secondary | ICD-10-CM | POA: Insufficient documentation

## 2022-11-04 DIAGNOSIS — Z87442 Personal history of urinary calculi: Secondary | ICD-10-CM | POA: Diagnosis not present

## 2022-11-04 DIAGNOSIS — Z7901 Long term (current) use of anticoagulants: Secondary | ICD-10-CM | POA: Diagnosis not present

## 2022-11-04 DIAGNOSIS — C787 Secondary malignant neoplasm of liver and intrahepatic bile duct: Secondary | ICD-10-CM | POA: Insufficient documentation

## 2022-11-04 DIAGNOSIS — Z79899 Other long term (current) drug therapy: Secondary | ICD-10-CM | POA: Diagnosis not present

## 2022-11-04 DIAGNOSIS — D649 Anemia, unspecified: Secondary | ICD-10-CM

## 2022-11-04 DIAGNOSIS — E785 Hyperlipidemia, unspecified: Secondary | ICD-10-CM | POA: Diagnosis not present

## 2022-11-04 DIAGNOSIS — Z8673 Personal history of transient ischemic attack (TIA), and cerebral infarction without residual deficits: Secondary | ICD-10-CM | POA: Insufficient documentation

## 2022-11-04 LAB — CBC WITH DIFFERENTIAL (CANCER CENTER ONLY)
Abs Immature Granulocytes: 0.04 10*3/uL (ref 0.00–0.07)
Basophils Absolute: 0.1 10*3/uL (ref 0.0–0.1)
Basophils Relative: 1 %
Eosinophils Absolute: 0.2 10*3/uL (ref 0.0–0.5)
Eosinophils Relative: 3 %
HCT: 26.3 % — ABNORMAL LOW (ref 36.0–46.0)
Hemoglobin: 7.9 g/dL — ABNORMAL LOW (ref 12.0–15.0)
Immature Granulocytes: 1 %
Lymphocytes Relative: 27 %
Lymphs Abs: 1.7 10*3/uL (ref 0.7–4.0)
MCH: 29.6 pg (ref 26.0–34.0)
MCHC: 30 g/dL (ref 30.0–36.0)
MCV: 98.5 fL (ref 80.0–100.0)
Monocytes Absolute: 0.5 10*3/uL (ref 0.1–1.0)
Monocytes Relative: 8 %
Neutro Abs: 3.8 10*3/uL (ref 1.7–7.7)
Neutrophils Relative %: 60 %
Platelet Count: 262 10*3/uL (ref 150–400)
RBC: 2.67 MIL/uL — ABNORMAL LOW (ref 3.87–5.11)
RDW: 16.5 % — ABNORMAL HIGH (ref 11.5–15.5)
WBC Count: 6.3 10*3/uL (ref 4.0–10.5)
nRBC: 0 % (ref 0.0–0.2)

## 2022-11-04 LAB — CMP (CANCER CENTER ONLY)
ALT: 9 U/L (ref 0–44)
AST: 14 U/L — ABNORMAL LOW (ref 15–41)
Albumin: 3.2 g/dL — ABNORMAL LOW (ref 3.5–5.0)
Alkaline Phosphatase: 106 U/L (ref 38–126)
Anion gap: 8 (ref 5–15)
BUN: 23 mg/dL (ref 8–23)
CO2: 23 mmol/L (ref 22–32)
Calcium: 9 mg/dL (ref 8.9–10.3)
Chloride: 106 mmol/L (ref 98–111)
Creatinine: 1.71 mg/dL — ABNORMAL HIGH (ref 0.44–1.00)
GFR, Estimated: 32 mL/min — ABNORMAL LOW (ref >60)
Glucose, Bld: 96 mg/dL (ref 70–99)
Potassium: 4.7 mmol/L (ref 3.5–5.1)
Sodium: 137 mmol/L (ref 135–145)
Total Bilirubin: 0.3 mg/dL (ref 0.3–1.2)
Total Protein: 6.7 g/dL (ref 6.5–8.1)

## 2022-11-04 LAB — SAMPLE TO BLOOD BANK

## 2022-11-04 LAB — PREPARE RBC (CROSSMATCH)

## 2022-11-04 NOTE — Progress Notes (Signed)
1 unit of PRBCs ordered.  Spoke with pt's sister and she confirmed appt date and time.  Pt and family will be leaving to go to the beach over the weekend.  Prepare and T&S orders released to Blood Bank for processing.

## 2022-11-06 ENCOUNTER — Inpatient Hospital Stay: Payer: 59

## 2022-11-06 ENCOUNTER — Other Ambulatory Visit: Payer: Self-pay

## 2022-11-06 DIAGNOSIS — D649 Anemia, unspecified: Secondary | ICD-10-CM

## 2022-11-06 DIAGNOSIS — D500 Iron deficiency anemia secondary to blood loss (chronic): Secondary | ICD-10-CM

## 2022-11-06 DIAGNOSIS — C787 Secondary malignant neoplasm of liver and intrahepatic bile duct: Secondary | ICD-10-CM | POA: Diagnosis not present

## 2022-11-06 DIAGNOSIS — C189 Malignant neoplasm of colon, unspecified: Secondary | ICD-10-CM

## 2022-11-06 MED ORDER — SODIUM CHLORIDE 0.9% IV SOLUTION
250.0000 mL | Freq: Once | INTRAVENOUS | Status: AC
  Administered 2022-11-06: 250 mL via INTRAVENOUS

## 2022-11-06 MED ORDER — DIPHENHYDRAMINE HCL 25 MG PO CAPS
25.0000 mg | ORAL_CAPSULE | Freq: Once | ORAL | Status: AC
  Administered 2022-11-06: 25 mg via ORAL
  Filled 2022-11-06: qty 1

## 2022-11-06 MED ORDER — ACETAMINOPHEN 325 MG PO TABS
650.0000 mg | ORAL_TABLET | Freq: Once | ORAL | Status: AC
  Administered 2022-11-06: 650 mg via ORAL
  Filled 2022-11-06: qty 2

## 2022-11-06 NOTE — Patient Instructions (Signed)

## 2022-11-07 LAB — TYPE AND SCREEN
ABO/RH(D): A POS
Antibody Screen: NEGATIVE
Unit division: 0

## 2022-11-07 LAB — BPAM RBC
Blood Product Expiration Date: 202401232359
ISSUE DATE / TIME: 202401040859
Unit Type and Rh: 6200

## 2022-11-17 ENCOUNTER — Encounter: Payer: Self-pay | Admitting: Hematology
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
## 2022-11-23 NOTE — Assessment & Plan Note (Signed)
-  h/o anemia for many years, previously received IV Feraheme in 05/2019. -denies hematochezia or other bleeding. Recall she has not had any treatment for her colon cancer, in part due to ankle fracture.  Her anemia is likely related to the slow bleeding from her colon cancer, and iron deficiency. -she has required intermittent blood transfusion.

## 2022-11-24 ENCOUNTER — Other Ambulatory Visit: Payer: Self-pay

## 2022-11-24 ENCOUNTER — Encounter: Payer: Self-pay | Admitting: Hematology

## 2022-11-24 ENCOUNTER — Inpatient Hospital Stay (HOSPITAL_BASED_OUTPATIENT_CLINIC_OR_DEPARTMENT_OTHER): Payer: 59 | Admitting: Hematology

## 2022-11-24 ENCOUNTER — Inpatient Hospital Stay: Payer: 59

## 2022-11-24 VITALS — BP 132/80 | HR 97 | Temp 97.5°F | Resp 17 | Wt 203.5 lb

## 2022-11-24 DIAGNOSIS — C189 Malignant neoplasm of colon, unspecified: Secondary | ICD-10-CM | POA: Diagnosis not present

## 2022-11-24 DIAGNOSIS — D500 Iron deficiency anemia secondary to blood loss (chronic): Secondary | ICD-10-CM | POA: Diagnosis not present

## 2022-11-24 DIAGNOSIS — C787 Secondary malignant neoplasm of liver and intrahepatic bile duct: Secondary | ICD-10-CM | POA: Diagnosis not present

## 2022-11-24 DIAGNOSIS — D649 Anemia, unspecified: Secondary | ICD-10-CM

## 2022-11-24 LAB — CBC WITH DIFFERENTIAL (CANCER CENTER ONLY)
Abs Immature Granulocytes: 0.08 10*3/uL — ABNORMAL HIGH (ref 0.00–0.07)
Basophils Absolute: 0.1 10*3/uL (ref 0.0–0.1)
Basophils Relative: 1 %
Eosinophils Absolute: 0.2 10*3/uL (ref 0.0–0.5)
Eosinophils Relative: 2 %
HCT: 27.9 % — ABNORMAL LOW (ref 36.0–46.0)
Hemoglobin: 8.7 g/dL — ABNORMAL LOW (ref 12.0–15.0)
Immature Granulocytes: 1 %
Lymphocytes Relative: 17 %
Lymphs Abs: 1.7 10*3/uL (ref 0.7–4.0)
MCH: 30 pg (ref 26.0–34.0)
MCHC: 31.2 g/dL (ref 30.0–36.0)
MCV: 96.2 fL (ref 80.0–100.0)
Monocytes Absolute: 0.6 10*3/uL (ref 0.1–1.0)
Monocytes Relative: 6 %
Neutro Abs: 7.4 10*3/uL (ref 1.7–7.7)
Neutrophils Relative %: 73 %
Platelet Count: 266 10*3/uL (ref 150–400)
RBC: 2.9 MIL/uL — ABNORMAL LOW (ref 3.87–5.11)
RDW: 17.2 % — ABNORMAL HIGH (ref 11.5–15.5)
WBC Count: 10.1 10*3/uL (ref 4.0–10.5)
nRBC: 0 % (ref 0.0–0.2)

## 2022-11-24 LAB — CMP (CANCER CENTER ONLY)
ALT: 11 U/L (ref 0–44)
AST: 15 U/L (ref 15–41)
Albumin: 3.1 g/dL — ABNORMAL LOW (ref 3.5–5.0)
Alkaline Phosphatase: 113 U/L (ref 38–126)
Anion gap: 9 (ref 5–15)
BUN: 25 mg/dL — ABNORMAL HIGH (ref 8–23)
CO2: 24 mmol/L (ref 22–32)
Calcium: 8.6 mg/dL — ABNORMAL LOW (ref 8.9–10.3)
Chloride: 105 mmol/L (ref 98–111)
Creatinine: 1.71 mg/dL — ABNORMAL HIGH (ref 0.44–1.00)
GFR, Estimated: 32 mL/min — ABNORMAL LOW (ref >60)
Glucose, Bld: 129 mg/dL — ABNORMAL HIGH (ref 70–99)
Potassium: 4.3 mmol/L (ref 3.5–5.1)
Sodium: 138 mmol/L (ref 135–145)
Total Bilirubin: 0.3 mg/dL (ref 0.3–1.2)
Total Protein: 6.3 g/dL — ABNORMAL LOW (ref 6.5–8.1)

## 2022-11-24 LAB — SAMPLE TO BLOOD BANK
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
## 2022-12-04 ENCOUNTER — Encounter: Payer: Self-pay | Admitting: Hematology

## 2022-12-08 ENCOUNTER — Encounter: Payer: Self-pay | Admitting: Hematology

## 2022-12-12 ENCOUNTER — Other Ambulatory Visit: Payer: Self-pay

## 2022-12-12 ENCOUNTER — Telehealth: Payer: Self-pay | Admitting: Hematology

## 2022-12-12 NOTE — Telephone Encounter (Signed)
Per 2/9 secure chat with charge, pt has been r/s to 2/13, pt sister has confirmed appt

## 2022-12-15 ENCOUNTER — Other Ambulatory Visit: Payer: Self-pay

## 2022-12-15 ENCOUNTER — Inpatient Hospital Stay: Payer: 59

## 2022-12-16 ENCOUNTER — Other Ambulatory Visit: Payer: Self-pay

## 2022-12-16 ENCOUNTER — Inpatient Hospital Stay: Payer: 59

## 2022-12-16 ENCOUNTER — Inpatient Hospital Stay: Payer: 59 | Attending: Hematology

## 2022-12-16 DIAGNOSIS — D500 Iron deficiency anemia secondary to blood loss (chronic): Secondary | ICD-10-CM | POA: Diagnosis not present

## 2022-12-16 DIAGNOSIS — C189 Malignant neoplasm of colon, unspecified: Secondary | ICD-10-CM | POA: Insufficient documentation

## 2022-12-16 DIAGNOSIS — D649 Anemia, unspecified: Secondary | ICD-10-CM

## 2022-12-16 DIAGNOSIS — C787 Secondary malignant neoplasm of liver and intrahepatic bile duct: Secondary | ICD-10-CM | POA: Diagnosis present

## 2022-12-16 LAB — FERRITIN: Ferritin: 11 ng/mL (ref 11–307)

## 2022-12-16 LAB — CMP (CANCER CENTER ONLY)
ALT: 10 U/L (ref 0–44)
AST: 14 U/L — ABNORMAL LOW (ref 15–41)
Albumin: 3.2 g/dL — ABNORMAL LOW (ref 3.5–5.0)
Alkaline Phosphatase: 145 U/L — ABNORMAL HIGH (ref 38–126)
Anion gap: 9 (ref 5–15)
BUN: 22 mg/dL (ref 8–23)
CO2: 24 mmol/L (ref 22–32)
Calcium: 8.7 mg/dL — ABNORMAL LOW (ref 8.9–10.3)
Chloride: 106 mmol/L (ref 98–111)
Creatinine: 1.75 mg/dL — ABNORMAL HIGH (ref 0.44–1.00)
GFR, Estimated: 31 mL/min — ABNORMAL LOW (ref >60)
Glucose, Bld: 94 mg/dL (ref 70–99)
Potassium: 4.5 mmol/L (ref 3.5–5.1)
Sodium: 139 mmol/L (ref 135–145)
Total Bilirubin: 0.3 mg/dL (ref 0.3–1.2)
Total Protein: 6.7 g/dL (ref 6.5–8.1)

## 2022-12-16 LAB — CBC WITH DIFFERENTIAL (CANCER CENTER ONLY)
Abs Immature Granulocytes: 0.1 10*3/uL — ABNORMAL HIGH (ref 0.00–0.07)
Basophils Absolute: 0.1 10*3/uL (ref 0.0–0.1)
Basophils Relative: 1 %
Eosinophils Absolute: 0.2 10*3/uL (ref 0.0–0.5)
Eosinophils Relative: 3 %
HCT: 24.8 % — ABNORMAL LOW (ref 36.0–46.0)
Hemoglobin: 7.7 g/dL — ABNORMAL LOW (ref 12.0–15.0)
Immature Granulocytes: 1 %
Lymphocytes Relative: 26 %
Lymphs Abs: 2 10*3/uL (ref 0.7–4.0)
MCH: 28.8 pg (ref 26.0–34.0)
MCHC: 31 g/dL (ref 30.0–36.0)
MCV: 92.9 fL (ref 80.0–100.0)
Monocytes Absolute: 0.6 10*3/uL (ref 0.1–1.0)
Monocytes Relative: 8 %
Neutro Abs: 4.8 10*3/uL (ref 1.7–7.7)
Neutrophils Relative %: 61 %
Platelet Count: 265 10*3/uL (ref 150–400)
RBC: 2.67 MIL/uL — ABNORMAL LOW (ref 3.87–5.11)
RDW: 17.4 % — ABNORMAL HIGH (ref 11.5–15.5)
WBC Count: 7.7 10*3/uL (ref 4.0–10.5)
nRBC: 0.3 % — ABNORMAL HIGH (ref 0.0–0.2)

## 2022-12-16 LAB — PREPARE RBC (CROSSMATCH)

## 2022-12-16 LAB — CEA (IN HOUSE-CHCC): CEA (CHCC-In House): 189.92 ng/mL — ABNORMAL HIGH (ref 0.00–5.00)

## 2022-12-16 LAB — SAMPLE TO BLOOD BANK

## 2022-12-16 MED ORDER — SODIUM CHLORIDE 0.9% IV SOLUTION
250.0000 mL | Freq: Once | INTRAVENOUS | Status: AC
  Administered 2022-12-16: 250 mL via INTRAVENOUS

## 2022-12-16 NOTE — Patient Instructions (Signed)

## 2022-12-17 LAB — TYPE AND SCREEN
ABO/RH(D): A POS
Antibody Screen: NEGATIVE
Unit division: 0

## 2022-12-17 LAB — BPAM RBC
Blood Product Expiration Date: 202403082359
ISSUE DATE / TIME: 202402131216
Unit Type and Rh: 6200

## 2022-12-31 ENCOUNTER — Telehealth: Payer: Self-pay

## 2022-12-31 NOTE — Telephone Encounter (Signed)
Called patient to let her know that she should arrive at [facility] at 840 for labs and then blood transfusion if needed following.  Patient fine with changes. Gardiner Rhyme, RN

## 2023-01-02 ENCOUNTER — Other Ambulatory Visit: Payer: Self-pay

## 2023-01-02 DIAGNOSIS — C787 Secondary malignant neoplasm of liver and intrahepatic bile duct: Secondary | ICD-10-CM

## 2023-01-05 ENCOUNTER — Inpatient Hospital Stay: Payer: 59 | Attending: Hematology

## 2023-01-05 ENCOUNTER — Inpatient Hospital Stay: Payer: 59

## 2023-01-05 VITALS — BP 132/69 | HR 98 | Temp 98.5°F | Resp 19

## 2023-01-05 DIAGNOSIS — I10 Essential (primary) hypertension: Secondary | ICD-10-CM | POA: Diagnosis not present

## 2023-01-05 DIAGNOSIS — C787 Secondary malignant neoplasm of liver and intrahepatic bile duct: Secondary | ICD-10-CM | POA: Insufficient documentation

## 2023-01-05 DIAGNOSIS — I700 Atherosclerosis of aorta: Secondary | ICD-10-CM | POA: Diagnosis not present

## 2023-01-05 DIAGNOSIS — C189 Malignant neoplasm of colon, unspecified: Secondary | ICD-10-CM | POA: Insufficient documentation

## 2023-01-05 DIAGNOSIS — Z87442 Personal history of urinary calculi: Secondary | ICD-10-CM | POA: Diagnosis not present

## 2023-01-05 DIAGNOSIS — Z79899 Other long term (current) drug therapy: Secondary | ICD-10-CM | POA: Diagnosis not present

## 2023-01-05 DIAGNOSIS — Z7901 Long term (current) use of anticoagulants: Secondary | ICD-10-CM | POA: Diagnosis not present

## 2023-01-05 DIAGNOSIS — E785 Hyperlipidemia, unspecified: Secondary | ICD-10-CM | POA: Diagnosis not present

## 2023-01-05 DIAGNOSIS — G629 Polyneuropathy, unspecified: Secondary | ICD-10-CM | POA: Insufficient documentation

## 2023-01-05 DIAGNOSIS — D500 Iron deficiency anemia secondary to blood loss (chronic): Secondary | ICD-10-CM | POA: Diagnosis not present

## 2023-01-05 DIAGNOSIS — Z8673 Personal history of transient ischemic attack (TIA), and cerebral infarction without residual deficits: Secondary | ICD-10-CM | POA: Insufficient documentation

## 2023-01-05 DIAGNOSIS — K760 Fatty (change of) liver, not elsewhere classified: Secondary | ICD-10-CM | POA: Insufficient documentation

## 2023-01-05 DIAGNOSIS — D649 Anemia, unspecified: Secondary | ICD-10-CM

## 2023-01-05 LAB — CBC WITH DIFFERENTIAL/PLATELET
Abs Immature Granulocytes: 0.12 10*3/uL — ABNORMAL HIGH (ref 0.00–0.07)
Basophils Absolute: 0 10*3/uL (ref 0.0–0.1)
Basophils Relative: 0 %
Eosinophils Absolute: 0.2 10*3/uL (ref 0.0–0.5)
Eosinophils Relative: 2 %
HCT: 25 % — ABNORMAL LOW (ref 36.0–46.0)
Hemoglobin: 7.4 g/dL — ABNORMAL LOW (ref 12.0–15.0)
Immature Granulocytes: 1 %
Lymphocytes Relative: 16 %
Lymphs Abs: 1.6 10*3/uL (ref 0.7–4.0)
MCH: 28.2 pg (ref 26.0–34.0)
MCHC: 29.6 g/dL — ABNORMAL LOW (ref 30.0–36.0)
MCV: 95.4 fL (ref 80.0–100.0)
Monocytes Absolute: 0.7 10*3/uL (ref 0.1–1.0)
Monocytes Relative: 7 %
Neutro Abs: 7.3 10*3/uL (ref 1.7–7.7)
Neutrophils Relative %: 74 %
Platelets: 242 10*3/uL (ref 150–400)
RBC: 2.62 MIL/uL — ABNORMAL LOW (ref 3.87–5.11)
RDW: 18.1 % — ABNORMAL HIGH (ref 11.5–15.5)
WBC: 9.9 10*3/uL (ref 4.0–10.5)
nRBC: 0.3 % — ABNORMAL HIGH (ref 0.0–0.2)

## 2023-01-05 LAB — COMPREHENSIVE METABOLIC PANEL
ALT: 14 U/L (ref 0–44)
AST: 20 U/L (ref 15–41)
Albumin: 2.6 g/dL — ABNORMAL LOW (ref 3.5–5.0)
Alkaline Phosphatase: 140 U/L — ABNORMAL HIGH (ref 38–126)
Anion gap: 9 (ref 5–15)
BUN: 28 mg/dL — ABNORMAL HIGH (ref 8–23)
CO2: 20 mmol/L — ABNORMAL LOW (ref 22–32)
Calcium: 8.3 mg/dL — ABNORMAL LOW (ref 8.9–10.3)
Chloride: 107 mmol/L (ref 98–111)
Creatinine, Ser: 1.7 mg/dL — ABNORMAL HIGH (ref 0.44–1.00)
GFR, Estimated: 32 mL/min — ABNORMAL LOW (ref >60)
Glucose, Bld: 95 mg/dL (ref 70–99)
Potassium: 4.1 mmol/L (ref 3.5–5.1)
Sodium: 136 mmol/L (ref 135–145)
Total Bilirubin: 0.3 mg/dL (ref 0.3–1.2)
Total Protein: 6.7 g/dL (ref 6.5–8.1)

## 2023-01-05 LAB — PREPARE RBC (CROSSMATCH)

## 2023-01-05 MED ORDER — HEPARIN SOD (PORK) LOCK FLUSH 100 UNIT/ML IV SOLN: 500.0000 [IU] | Freq: Every day | INTRAVENOUS | Status: DC | PRN

## 2023-01-05 MED ORDER — SODIUM CHLORIDE 0.9% IV SOLUTION
250.0000 mL | Freq: Once | INTRAVENOUS | Status: AC
  Administered 2023-01-05: 250 mL via INTRAVENOUS

## 2023-01-05 MED ORDER — SODIUM CHLORIDE 0.9% FLUSH: 10.0000 mL | INTRAVENOUS | Status: DC | PRN

## 2023-01-05 NOTE — Progress Notes (Signed)
Patient presents today for 2 units of PRBC's , Dr. Ernestina Penna orders. Per physician orders patient does not receive pre-meds prior to blood administration. Orders in snapshot.  Vitals signs stable. Patient has no complaints of any changes today. MAR reviewed and updated.   2 units of blood given today per MD orders. Tolerated infusion without adverse affects. Vital signs stable. No complaints at this time. Discharged from clinic by wheel chair in stable condition. Alert and oriented x 3. F/U with [facility] as scheduled.

## 2023-01-05 NOTE — Patient Instructions (Signed)
[place]  Discharge Instructions: Thank you for choosing [facility] to provide your oncology and hematology care.  If you have a lab appointment with the [facility], please come in thru the Main Entrance and check in at the main information desk.  Wear comfortable clothing and clothing appropriate for easy access to any Portacath or PICC line.   We strive to give you quality time with your provider. You may need to reschedule your appointment if you arrive late (15 or more minutes).  Arriving late affects you and other patients whose appointments are after yours.  Also, if you miss three or more appointments without notifying the office, you may be dismissed from the clinic at the provider's discretion.      For prescription refill requests, have your pharmacy contact our office and allow 72 hours for refills to be completed.    Today you received the following chemotherapy and/or immunotherapy agents 2 units of blood.  Blood Transfusion, Adult, Care After The following information offers guidance on how to care for yourself after your procedure. Your health care provider may also give you more specific instructions. If you have problems or questions, contact your health care provider. What can I expect after the procedure? After the procedure, it is common to have: Bruising and soreness where the IV was inserted. A headache. Follow these instructions at home: IV insertion site care     Follow instructions from your health care provider about how to take care of your IV insertion site. Make sure you: Wash your hands with soap and water for at least 20 seconds before and after you change your bandage (dressing). If soap and water are not available, use hand sanitizer. Change your dressing as told by your health care provider. Check your IV insertion site every day for signs of infection. Check for: Redness, swelling, or pain. Bleeding from the  site. Warmth. Pus or a bad smell. General instructions Take over-the-counter and prescription medicines only as told by your health care provider. Rest as told by your health care provider. Return to your normal activities as told by your health care provider. Keep all follow-up visits. Lab tests may need to be done at certain periods to recheck your blood counts. Contact a health care provider if: You have itching or red, swollen areas of skin (hives). You have a fever or chills. You have pain in the head, back, or chest. You feel anxious or you feel weak after doing your normal activities. You have redness, swelling, warmth, or pain around the IV insertion site. You have blood coming from the IV insertion site that does not stop with pressure. You have pus or a bad smell coming from your IV insertion site. If you received your blood transfusion in an outpatient setting, you will be told whom to contact to report any reactions. Get help right away if: You have symptoms of a serious allergic or immune system reaction, including: Trouble breathing or shortness of breath. Swelling of the face, feeling flushed, or widespread rash. Dark urine or blood in the urine. Fast heartbeat. These symptoms may be an emergency. Get help right away. Call 911. Do not wait to see if the symptoms will go away. Do not drive yourself to the hospital. Summary Bruising and soreness around the IV insertion site are common. Check your IV insertion site every day for signs of infection. Rest as told by your health care provider. Return to your normal  activities as told by your health care provider. Get help right away for symptoms of a serious allergic or immune system reaction to the blood transfusion. This information is not intended to replace advice given to you by your health care provider. Make sure you discuss any questions you have with your health care provider. Document Revised: 01/17/2022 Document  Reviewed: 01/17/2022 Elsevier Patient Education  [place].       To help prevent nausea and vomiting after your treatment, we encourage you to take your nausea medication as directed.  BELOW ARE SYMPTOMS THAT SHOULD BE REPORTED IMMEDIATELY: *FEVER GREATER THAN 100.4 F (38 C) OR HIGHER *CHILLS OR SWEATING *NAUSEA AND VOMITING THAT IS NOT CONTROLLED WITH YOUR NAUSEA MEDICATION *UNUSUAL SHORTNESS OF BREATH *UNUSUAL BRUISING OR BLEEDING *URINARY PROBLEMS (pain or burning when urinating, or frequent urination) *BOWEL PROBLEMS (unusual diarrhea, constipation, pain near the anus) TENDERNESS IN MOUTH AND THROAT WITH OR WITHOUT PRESENCE OF ULCERS (sore throat, sores in mouth, or a toothache) UNUSUAL RASH, SWELLING OR PAIN  UNUSUAL VAGINAL DISCHARGE OR ITCHING   Items with * indicate a potential emergency and should be followed up as soon as possible or go to the Emergency Department if any problems should occur.  Please show the CHEMOTHERAPY ALERT CARD or IMMUNOTHERAPY ALERT CARD at check-in to the Emergency Department and triage nurse.  Should you have questions after your visit or need to cancel or reschedule your appointment, please contact [facility] [phone]  and follow the prompts.  Office hours are 8:00 a.m. to 4:30 p.m. Monday - Friday. Please note that voicemails left after 4:00 p.m. may not be returned until the following business day.  We are closed weekends and major holidays. You have access to a nurse at all times for urgent questions. Please call the main number to the clinic [phone] and follow the prompts.  For any non-urgent questions, you may also contact your provider using MyChart. We now offer e-Visits for anyone 48 and older to request care online for non-urgent symptoms. For details visit [email or web address].   Also download the MyChart app! Go to the app store, search "MyChart", open the app, select [facility], and log in with  your MyChart username and password.

## 2023-01-06 LAB — TYPE AND SCREEN
ABO/RH(D): A POS
Antibody Screen: NEGATIVE
Unit division: 0
Unit division: 0

## 2023-01-06 LAB — BPAM RBC
Blood Product Expiration Date: 202404012359
Blood Product Expiration Date: 202404042359
ISSUE DATE / TIME: 202403041203
Unit Type and Rh: 5100
Unit Type and Rh: 6200

## 2023-01-16 ENCOUNTER — Encounter: Payer: Self-pay | Admitting: Hematology

## 2023-01-26 ENCOUNTER — Ambulatory Visit: Payer: 59 | Admitting: Podiatry

## 2023-01-27 ENCOUNTER — Other Ambulatory Visit: Payer: Self-pay

## 2023-01-27 NOTE — Progress Notes (Unsigned)
Patient Care Team: [name], MD as PCP - General (Internal Medicine) [name], MD (Internal Medicine) Truitt Merle, MD as Consulting Physician (Oncology)   CHIEF COMPLAINT: Follow-up metastatic colon cancer  Oncology History Overview Note   Cancer Staging  Metastatic colon cancer to liver [facility]) Staging form: Colon and Rectum, AJCC 8th Edition - Clinical stage from 12/04/2021: Stage IVA (cTX, cN0, pM1a) - Signed by Truitt Merle, MD on 01/17/2022    Metastatic colon cancer to liver ([place])  12/04/2021 Cancer Staging   Staging form: Colon and Rectum, AJCC 8th Edition - Clinical stage from 12/04/2021: Stage IVA (cTX, cN0, pM1a) - Signed by Truitt Merle, MD on 01/17/2022 Total positive nodes: 0   12/04/2021 Imaging   EXAM: CT HEAD WITHOUT CONTRAST CT CERVICAL SPINE WITHOUT CONTRAST CT CHEST, ABDOMEN AND PELVIS WITH CONTRAST  IMPRESSION: CT of the head: -Small parafalcine subdural hematoma. No signs of midline shift or mass effect at this time. -Signs of bilateral styloid process fractures in the setting of cervical spine injury.   CT of the cervical spine: -Unstable fracture at C2 compatible with hangman's fracture with moderate angulation of posterior elements and with extension into bilateral neural foramina, potentially associated with small amount of hematoma in the central canal. CT angiography may be helpful for further assessment of vertebral arteries. -Degenerative changes elsewhere in the cervical spine   CT of the chest, abdomen and pelvis: -Hemoperitoneum in the setting of mesenteric hematoma in mesenteric injury in the RIGHT lower quadrant and signs of subtle areas of extravasation in the RIGHT lower quadrant arising from injured small bowel mesentery in the setting of mesenteric injury. Signs of hypoperfused bowel/bowel with developing ischemia in the RIGHT lower quadrant. -Site of mesenteric injury in the ileal mesentery also likely proximal jejunal mesenteric injury as  well. -Multiple clefts in the spleen, difficult to exclude areas of laceration as outlined above. There is perisplenic hemoperitoneum but area of denser blood in the pelvis favors mesenteric source. -Irregular low-attenuation area in the dome of the RIGHT hemi liver could represent an area of contusion or laceration superimposed on pre-existing cysts in this area. Consider attention on follow-up. -Multiple RIGHT-sided rib fractures, without pneumothorax. -Area of thickening at the splenic flexure with adjacent nodularity and small lymph nodes. Findings raise the question of colon cancer. -In the setting of trauma colonic thickening could also represent traumatic injury to the splenic flexure. -Aortic Atherosclerosis (ICD10-I70.0).  ADDENDUM: 1. Liver lesions were seen on previous MR imaging and numerous lesions were felt to represent cysts at the time of a more remote evaluation. Some areas in the liver appear more dense than expected for cysts on the current study and in the context of abnormality discovered at laparotomy and on the initial scan follow-up liver imaging after resolution of acute symptoms with either MRI or multiphase CT is suggested. 2. Colonic thickening of the splenic flexure with adjacent nodularity is again concerning for neoplasm but in the setting of trauma could consider delayed imaging as warranted based on laparotomy results and clinical parameters to exclude any injury to this area. 3. 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  ADDENDUM: Additional findings of colonic thickening and styloid process fractures were communicated as outlined below.   12/04/2021 Definitive Surgery   FINAL MICROSCOPIC DIAGNOSIS:   A. SMALL BOWEL RESECTION:  - Segment of small intestine (4.2 cm) showing congestion and focal serosal/subserosal hemorrhage  - Margins appear viable  B.  LIVER, NODULE, BIOPSY:  - Metastatic adenocarcinoma to liver, see comment   COMMENT:  B.  Immunohistochemical stains show that the tumor cells are positive for CDX2 while they are negative for CK7 and CK20.  This immunoprofile is consistent with a gastrointestinal primary, most likely of colorectal origin.  Clinical and radiologic correlation is suggested.  ADDENDUM:  Mismatch Repair Protein (I[facility])   SUMMARY INTERPRETATION: NORMAL    12/27/2021 Initial Diagnosis   Metastatic colon cancer to liver [facility])      CURRENT THERAPY: Supportive care  INTERVAL HISTORY [name] returns for follow-up as scheduled, last seen by Dr. Burr Medico 11/24/2022   ROS   Past Medical History:  Diagnosis Date   Anemia    Fatty liver    History of gout    last episode left foot 2010   History of kidney stones    History of septic shock    05-12-2019  w/ bacteremia;  gallstone pancreatitis--- resolved   History of transient ischemic attack (TIA)    04-16-2008  --  per pt no residual   Hyperlipidemia    Hypertension    Peripheral neuropathy    Personal history of renal cell carcinoma urologist-  dr Tresa Moore   07-17-2005  s/p  left nephrectomy , clear cell type   Renal cell cancer ([place])    Solitary right kidney    acquired;  left nephrectomy 07-18-2015 for RCC   TIA (transient ischemic attack)    Wears glasses      Past Surgical History:  Procedure Laterality Date   ANKLE CLOSED REDUCTION Left 12/04/2021   Procedure: CLOSED REDUCTION LEFT ANKLE SPLINT;  Surgeon: Armond Hang, MD;  Location: [place];  Service: Orthopedics;  Laterality: Left;   CESAREAN SECTION  x2  yrs ago   CESAREAN SECTION     CHOLECYSTECTOMY N/A 07/21/2019   Procedure: LAPAROSCOPIC CHOLECYSTECTOMY WITH INTRAOPERATIVE CHOLANGIOGRAM;  Surgeon: [name], [name], MD;  Location: [place];  Service: General;  Laterality: N/A;   CHOLECYSTECTOMY, LAPAROSCOPIC  07/21/2019   CYSTOSCOPY WITH RETROGRADE PYELOGRAM,  URETEROSCOPY AND STENT PLACEMENT Bilateral 06/08/2015   Procedure: CYSTOSCOPY WITH RETROGRADE PYELOGRAM, URETEROSCOPY, STONE EXTRACTION AND STENT PLACEMENT ON THE RIGHT, DIAGNOSTIC URETEROSCOPY AND STENT PLACEMENT ON THE LEFT;  Surgeon: Alexis Frock, MD;  Location: [facility];  Service: Urology;  Laterality: Bilateral;   HOLMIUM LASER APPLICATION Right [id number]   Procedure: HOLMIUM LASER APPLICATION;  Surgeon: Alexis Frock, MD;  Location: [facility];  Service: Urology;  Laterality: Right;   LAPAROTOMY N/A 12/04/2021   Procedure: EXPLORATORY LAPAROTOMY small bowel resection;  Surgeon: Dwan Bolt, MD;  Location: [place];  Service: General;  Laterality: N/A;   NEPHRECTOMY Left 07/18/2015   ORIF ANKLE FRACTURE Left 12/10/2021   Procedure: OPEN REDUCTION INTERNAL FIXATION (ORIF) ANKLE FRACTURE;  Surgeon: Altamese Rosholt, MD;  Location: [place];  Service: Orthopedics;  Laterality: Left;   ORIF TIBIA PLATEAU Right 12/10/2021   Procedure: OPEN REDUCTION INTERNAL FIXATION (ORIF) TIBIAL PLATEAU;  Surgeon: Altamese Urbank, MD;  Location: [place];  Service: Orthopedics;  Laterality: Right;   ROBOT ASSISTED LAPAROSCOPIC NEPHRECTOMY Left 07/18/2015   Procedure: ROBOTIC ASSISTED LAPAROSCOPIC NEPHRECTOMY;  Surgeon: Alexis Frock, MD;  Location: [facility];  Service: Urology;  Laterality: Left;   TUBAL LIGATION Bilateral yrs ago   TUBAL LIGATION       Outpatient Encounter Medications as of 01/28/2023  Medication Sig   acetaminophen (TYLENOL) 325 MG tablet Take 650 mg by mouth every 6 (six) hours  as needed (General Discomfort).   acetaminophen (TYLENOL) 500 MG tablet Take 2 tablets (1,000 mg total) by mouth every 6 (six) hours.   allopurinol (ZYLOPRIM) 300 MG tablet Take 300 mg by mouth daily.   amLODipine (NORVASC) 5 MG tablet Take 1 tablet (5 mg total) by mouth daily.   docusate sodium (COLACE) 100 MG capsule Take 1 capsule (100 mg total) by mouth 2 (two) times daily.   ELIQUIS 5 MG TABS tablet Take 5 mg by mouth 2 (two) times  daily.   feeding supplement (ENSURE ENLIVE / ENSURE PLUS) LIQD Take 237 mLs by mouth 2 (two) times daily between meals.   gabapentin (NEURONTIN) 300 MG capsule Take 300 mg by mouth 3 (three) times daily.   lovastatin (MEVACOR) 20 MG tablet Take 20 mg by mouth daily.   metoprolol tartrate (LOPRESSOR) 25 MG tablet Take 25 mg by mouth 2 (two) times daily.   Multiple Vitamins-Minerals (MULTIVITAMIN-MINERALS PO) Take 1 tablet by mouth daily.   oxyCODONE (OXY IR/ROXICODONE) 5 MG immediate release tablet Take 1 tablet (5 mg total) by mouth every 6 (six) hours as needed for severe pain.   oxyCODONE-acetaminophen (PERCOCET/ROXICET) 5-325 MG tablet Take 1 tablet by mouth 4 (four) times daily as needed.   tamsulosin (FLOMAX) 0.4 MG CAPS capsule Take 0.4 mg by mouth daily.   tiZANidine (ZANAFLEX) 2 MG tablet Take 1 tablet (2 mg total) by mouth every 8 (eight) hours as needed.   Vitamin D, Ergocalciferol, (DRISDOL) 1.25 MG (50000 UNIT) CAPS capsule Take 50,000 Units by mouth once a week.   No facility-administered encounter medications on file as of 01/28/2023.     There were no vitals filed for this visit. There is no height or weight on file to calculate BMI.   PHYSICAL EXAM GENERAL:alert, no distress and comfortable SKIN: no rash  EYES: sclera clear NECK: without mass LYMPH:  no palpable cervical or supraclavicular lymphadenopathy  LUNGS: clear with normal breathing effort HEART: regular rate & rhythm, no lower extremity edema ABDOMEN: abdomen soft, non-tender and normal bowel sounds NEURO: alert & oriented x 3 with fluent speech, no focal motor/sensory deficits Breast exam:  PAC without erythema    CBC    Component Value Date/Time   WBC 9.9 01/05/2023 0852   RBC 2.62 (L) 01/05/2023 0852   HGB 7.4 (L) 01/05/2023 0852   HGB 7.7 (L) 12/16/2022 1015   HCT 25.0 (L) 01/05/2023 0852   PLT 242 01/05/2023 0852   PLT 265 12/16/2022 1015   MCV 95.4 01/05/2023 0852   MCH 28.2 01/05/2023 0852    MCHC 29.6 (L) 01/05/2023 0852   RDW 18.1 (H) 01/05/2023 0852   LYMPHSABS 1.6 01/05/2023 0852   MONOABS 0.7 01/05/2023 0852   EOSABS 0.2 01/05/2023 0852   BASOSABS 0.0 01/05/2023 0852     CMP     Component Value Date/Time   NA 136 01/05/2023 0852   K 4.1 01/05/2023 0852   CL 107 01/05/2023 0852   CO2 20 (L) 01/05/2023 0852   GLUCOSE 95 01/05/2023 0852   BUN 28 (H) 01/05/2023 0852   CREATININE 1.70 (H) 01/05/2023 0852   CREATININE 1.75 (H) 12/16/2022 1015   CALCIUM 8.3 (L) 01/05/2023 0852   PROT 6.7 01/05/2023 0852   ALBUMIN 2.6 (L) 01/05/2023 0852   AST 20 01/05/2023 0852   AST 14 (L) 12/16/2022 1015   ALT 14 01/05/2023 0852   ALT 10 12/16/2022 1015   ALKPHOS 140 (H) 01/05/2023 0852   BILITOT 0.3 01/05/2023  [id number]   BILITOT 0.3 12/16/2022 1015   GFRNONAA 32 (L) 01/05/2023 0852   GFRNONAA 31 (L) 12/16/2022 1015   GFRAA 27 (L) 07/18/2019 1529     ASSESSMENT & PLAN:  PLAN:  No orders of the defined types were placed in this encounter.     All questions were answered. The patient knows to call the clinic with any problems, questions or concerns. No barriers to learning were detected. I spent *** counseling the patient face to face. The total time spent in the appointment was *** and more than 50% was on counseling, review of test results, and coordination of care.   Cira Rue, NP-C @DATE @

## 2023-01-28 ENCOUNTER — Other Ambulatory Visit: Payer: Self-pay

## 2023-01-28 ENCOUNTER — Encounter: Payer: Self-pay | Admitting: Nurse Practitioner

## 2023-01-28 ENCOUNTER — Inpatient Hospital Stay: Payer: 59

## 2023-01-28 ENCOUNTER — Inpatient Hospital Stay (HOSPITAL_BASED_OUTPATIENT_CLINIC_OR_DEPARTMENT_OTHER): Payer: 59 | Admitting: Nurse Practitioner

## 2023-01-28 VITALS — BP 109/79 | HR 69 | Temp 97.5°F | Resp 18 | Wt 196.2 lb

## 2023-01-28 DIAGNOSIS — C189 Malignant neoplasm of colon, unspecified: Secondary | ICD-10-CM | POA: Diagnosis not present

## 2023-01-28 DIAGNOSIS — D500 Iron deficiency anemia secondary to blood loss (chronic): Secondary | ICD-10-CM

## 2023-01-28 DIAGNOSIS — C787 Secondary malignant neoplasm of liver and intrahepatic bile duct: Secondary | ICD-10-CM

## 2023-01-28 DIAGNOSIS — D649 Anemia, unspecified: Secondary | ICD-10-CM

## 2023-01-28 LAB — CEA (IN HOUSE-CHCC): CEA (CHCC-In House): 202.35 ng/mL — ABNORMAL HIGH (ref 0.00–5.00)

## 2023-01-28 LAB — CMP (CANCER CENTER ONLY)
ALT: 13 U/L (ref 0–44)
AST: 15 U/L (ref 15–41)
Albumin: 3.5 g/dL (ref 3.5–5.0)
Alkaline Phosphatase: 123 U/L (ref 38–126)
Anion gap: 8 (ref 5–15)
BUN: 24 mg/dL — ABNORMAL HIGH (ref 8–23)
CO2: 22 mmol/L (ref 22–32)
Calcium: 9 mg/dL (ref 8.9–10.3)
Chloride: 107 mmol/L (ref 98–111)
Creatinine: 1.63 mg/dL — ABNORMAL HIGH (ref 0.44–1.00)
GFR, Estimated: 34 mL/min — ABNORMAL LOW (ref >60)
Glucose, Bld: 97 mg/dL (ref 70–99)
Potassium: 4.4 mmol/L (ref 3.5–5.1)
Sodium: 137 mmol/L (ref 135–145)
Total Bilirubin: 0.3 mg/dL (ref 0.3–1.2)
Total Protein: 7.3 g/dL (ref 6.5–8.1)

## 2023-01-28 LAB — CBC WITH DIFFERENTIAL (CANCER CENTER ONLY)
Abs Immature Granulocytes: 0.09 10*3/uL — ABNORMAL HIGH (ref 0.00–0.07)
Basophils Absolute: 0.1 10*3/uL (ref 0.0–0.1)
Basophils Relative: 1 %
Eosinophils Absolute: 0.3 10*3/uL (ref 0.0–0.5)
Eosinophils Relative: 3 %
HCT: 28 % — ABNORMAL LOW (ref 36.0–46.0)
Hemoglobin: 8.9 g/dL — ABNORMAL LOW (ref 12.0–15.0)
Immature Granulocytes: 1 %
Lymphocytes Relative: 19 %
Lymphs Abs: 1.8 10*3/uL (ref 0.7–4.0)
MCH: 28.8 pg (ref 26.0–34.0)
MCHC: 31.8 g/dL (ref 30.0–36.0)
MCV: 90.6 fL (ref 80.0–100.0)
Monocytes Absolute: 0.6 10*3/uL (ref 0.1–1.0)
Monocytes Relative: 7 %
Neutro Abs: 6.6 10*3/uL (ref 1.7–7.7)
Neutrophils Relative %: 69 %
Platelet Count: 271 10*3/uL (ref 150–400)
RBC: 3.09 MIL/uL — ABNORMAL LOW (ref 3.87–5.11)
RDW: 18.6 % — ABNORMAL HIGH (ref 11.5–15.5)
WBC Count: 9.4 10*3/uL (ref 4.0–10.5)
nRBC: 0.2 % (ref 0.0–0.2)

## 2023-01-28 LAB — SAMPLE TO BLOOD BANK

## 2023-01-28 LAB — FERRITIN: Ferritin: 23 ng/mL (ref 11–307)

## 2023-01-29 ENCOUNTER — Other Ambulatory Visit: Payer: Self-pay

## 2023-01-29 NOTE — Progress Notes (Signed)
Order for lift chair recliner ordered in [facility].  Will fax signed order on Monday 02/02/2023 to [facility] when Brittney Rue, NP returns to the office.

## 2023-02-02 ENCOUNTER — Telehealth: Payer: Self-pay

## 2023-02-02 NOTE — Telephone Encounter (Addendum)
Called patient and relayed message below to her sister. She voiced full understanding.    ----- Message from Alla Feeling, NP sent at 01/30/2023  8:47 AM EDT ----- Please let Ms. Long know ferritin is 27, no need for IV iron. CEA slightly up, but similar to 1 month ago. She is not currently on treatment.   Thanks, Regan Rakers NP

## 2023-02-02 NOTE — Telephone Encounter (Signed)
Faxed a signed referral for a patient lift chair after placing the order in [facility] as per Cira Rue NP.

## 2023-02-04 ENCOUNTER — Encounter: Payer: Self-pay | Admitting: Podiatry

## 2023-02-04 ENCOUNTER — Ambulatory Visit (INDEPENDENT_AMBULATORY_CARE_PROVIDER_SITE_OTHER): Payer: 59 | Admitting: Podiatry

## 2023-02-04 DIAGNOSIS — M79674 Pain in right toe(s): Secondary | ICD-10-CM

## 2023-02-04 DIAGNOSIS — B351 Tinea unguium: Secondary | ICD-10-CM | POA: Diagnosis not present

## 2023-02-04 DIAGNOSIS — D689 Coagulation defect, unspecified: Secondary | ICD-10-CM

## 2023-02-04 DIAGNOSIS — M79675 Pain in left toe(s): Secondary | ICD-10-CM

## 2023-02-04 NOTE — Progress Notes (Signed)
This patient returns to my office for at risk foot care.  This patient requires this care by a professional since this patient will be at risk due to having  kidney disease and coagulation defect. Patient is taking eliquis. This patient is unable to cut nails herself since the patient cannot reach her nails.These nails are painful walking and wearing shoes.  This patient presents for at risk foot care today.  General Appearance  Alert, conversant and in no acute stress.  Vascular  Dorsalis pedis and posterior tibial  pulses are palpable  bilaterally.  Capillary return is within normal limits  bilaterally. Temperature is within normal limits  bilaterally.  Neurologic  Senn-Weinstein monofilament wire test within normal limits  bilaterally. Muscle power within normal limits bilaterally.  Nails Thick disfigured discolored nails with subungual debris  from hallux to fifth toes bilaterally. No evidence of bacterial infection or drainage bilaterally.  Orthopedic  No limitations of motion  feet .  No crepitus or effusions noted.  No bony pathology or digital deformities noted.  Skin  normotropic skin with no porokeratosis noted bilaterally.  No signs of infections or ulcers noted.     Onychomycosis  Pain in right toes  Pain in left toes  Consent was obtained for treatment procedures.   Mechanical debridement of nails 1-5  bilaterally performed with a nail nipper.  Filed with dremel without incident. Wore nail polish on her nails so I was limited in dremel tool usage.   Return office visit    3 months                  Told patient to return for periodic foot care and evaluation due to potential at risk complications.   Emmerson Shuffield DPM   

## 2023-02-18 ENCOUNTER — Inpatient Hospital Stay: Payer: 59

## 2023-02-18 ENCOUNTER — Inpatient Hospital Stay: Payer: 59 | Attending: Hematology

## 2023-02-18 ENCOUNTER — Other Ambulatory Visit: Payer: Self-pay

## 2023-02-18 ENCOUNTER — Other Ambulatory Visit: Payer: Self-pay | Admitting: *Deleted

## 2023-02-18 DIAGNOSIS — D500 Iron deficiency anemia secondary to blood loss (chronic): Secondary | ICD-10-CM | POA: Diagnosis present

## 2023-02-18 DIAGNOSIS — C189 Malignant neoplasm of colon, unspecified: Secondary | ICD-10-CM | POA: Insufficient documentation

## 2023-02-18 DIAGNOSIS — C787 Secondary malignant neoplasm of liver and intrahepatic bile duct: Secondary | ICD-10-CM | POA: Diagnosis present

## 2023-02-18 DIAGNOSIS — D649 Anemia, unspecified: Secondary | ICD-10-CM

## 2023-02-18 LAB — BPAM RBC: ISSUE DATE / TIME: 202404171515

## 2023-02-18 LAB — CMP (CANCER CENTER ONLY)
ALT: 11 U/L (ref 0–44)
AST: 15 U/L (ref 15–41)
Albumin: 3.4 g/dL — ABNORMAL LOW (ref 3.5–5.0)
Alkaline Phosphatase: 124 U/L (ref 38–126)
Anion gap: 6 (ref 5–15)
BUN: 27 mg/dL — ABNORMAL HIGH (ref 8–23)
CO2: 25 mmol/L (ref 22–32)
Calcium: 9.3 mg/dL (ref 8.9–10.3)
Chloride: 108 mmol/L (ref 98–111)
Creatinine: 1.88 mg/dL — ABNORMAL HIGH (ref 0.44–1.00)
GFR, Estimated: 29 mL/min — ABNORMAL LOW (ref >60)
Glucose, Bld: 156 mg/dL — ABNORMAL HIGH (ref 70–99)
Potassium: 4.5 mmol/L (ref 3.5–5.1)
Sodium: 139 mmol/L (ref 135–145)
Total Bilirubin: 0.3 mg/dL (ref 0.3–1.2)
Total Protein: 6.9 g/dL (ref 6.5–8.1)

## 2023-02-18 LAB — SAMPLE TO BLOOD BANK

## 2023-02-18 LAB — CBC WITH DIFFERENTIAL (CANCER CENTER ONLY)
Abs Immature Granulocytes: 0.07 10*3/uL (ref 0.00–0.07)
Basophils Absolute: 0.1 10*3/uL (ref 0.0–0.1)
Basophils Relative: 1 %
Eosinophils Absolute: 0.3 10*3/uL (ref 0.0–0.5)
Eosinophils Relative: 3 %
HCT: 24.6 % — ABNORMAL LOW (ref 36.0–46.0)
Hemoglobin: 7.4 g/dL — ABNORMAL LOW (ref 12.0–15.0)
Immature Granulocytes: 1 %
Lymphocytes Relative: 27 %
Lymphs Abs: 2.4 10*3/uL (ref 0.7–4.0)
MCH: 27.8 pg (ref 26.0–34.0)
MCHC: 30.1 g/dL (ref 30.0–36.0)
MCV: 92.5 fL (ref 80.0–100.0)
Monocytes Absolute: 0.6 10*3/uL (ref 0.1–1.0)
Monocytes Relative: 7 %
Neutro Abs: 5.4 10*3/uL (ref 1.7–7.7)
Neutrophils Relative %: 61 %
Platelet Count: 290 10*3/uL (ref 150–400)
RBC: 2.66 MIL/uL — ABNORMAL LOW (ref 3.87–5.11)
RDW: 18.1 % — ABNORMAL HIGH (ref 11.5–15.5)
WBC Count: 8.8 10*3/uL (ref 4.0–10.5)
nRBC: 0.3 % — ABNORMAL HIGH (ref 0.0–0.2)

## 2023-02-18 LAB — TYPE AND SCREEN: Unit division: 0

## 2023-02-18 LAB — PREPARE RBC (CROSSMATCH)

## 2023-02-18 LAB — FERRITIN: Ferritin: 13 ng/mL (ref 11–307)

## 2023-02-18 MED ORDER — SODIUM CHLORIDE 0.9% IV SOLUTION
250.0000 mL | Freq: Once | INTRAVENOUS | Status: AC
  Administered 2023-02-18: 250 mL via INTRAVENOUS

## 2023-02-18 NOTE — Patient Instructions (Signed)

## 2023-02-18 NOTE — Progress Notes (Signed)
Patient hemoglobin is 7.4 today. Per Dr. Mosetta Putt ok to get only one unit fo blood- patient comes in every three weeks.

## 2023-02-19 LAB — TYPE AND SCREEN
ABO/RH(D): A POS
Antibody Screen: NEGATIVE

## 2023-02-19 LAB — BPAM RBC
Blood Product Expiration Date: 202405092359
Unit Type and Rh: 6200

## 2023-03-11 ENCOUNTER — Inpatient Hospital Stay: Payer: 59 | Attending: Hematology

## 2023-03-11 ENCOUNTER — Inpatient Hospital Stay: Payer: 59

## 2023-03-11 ENCOUNTER — Other Ambulatory Visit: Payer: Self-pay

## 2023-03-11 ENCOUNTER — Other Ambulatory Visit: Payer: Self-pay | Admitting: *Deleted

## 2023-03-11 DIAGNOSIS — C189 Malignant neoplasm of colon, unspecified: Secondary | ICD-10-CM | POA: Diagnosis present

## 2023-03-11 DIAGNOSIS — D500 Iron deficiency anemia secondary to blood loss (chronic): Secondary | ICD-10-CM | POA: Diagnosis not present

## 2023-03-11 DIAGNOSIS — K760 Fatty (change of) liver, not elsewhere classified: Secondary | ICD-10-CM | POA: Diagnosis not present

## 2023-03-11 DIAGNOSIS — S2241XA Multiple fractures of ribs, right side, initial encounter for closed fracture: Secondary | ICD-10-CM | POA: Diagnosis not present

## 2023-03-11 DIAGNOSIS — Z79899 Other long term (current) drug therapy: Secondary | ICD-10-CM | POA: Diagnosis not present

## 2023-03-11 DIAGNOSIS — C787 Secondary malignant neoplasm of liver and intrahepatic bile duct: Secondary | ICD-10-CM | POA: Diagnosis not present

## 2023-03-11 DIAGNOSIS — D649 Anemia, unspecified: Secondary | ICD-10-CM

## 2023-03-11 DIAGNOSIS — E785 Hyperlipidemia, unspecified: Secondary | ICD-10-CM | POA: Diagnosis not present

## 2023-03-11 DIAGNOSIS — Z8673 Personal history of transient ischemic attack (TIA), and cerebral infarction without residual deficits: Secondary | ICD-10-CM | POA: Insufficient documentation

## 2023-03-11 DIAGNOSIS — I700 Atherosclerosis of aorta: Secondary | ICD-10-CM | POA: Insufficient documentation

## 2023-03-11 DIAGNOSIS — I10 Essential (primary) hypertension: Secondary | ICD-10-CM | POA: Insufficient documentation

## 2023-03-11 DIAGNOSIS — Z7901 Long term (current) use of anticoagulants: Secondary | ICD-10-CM | POA: Diagnosis not present

## 2023-03-11 LAB — CBC WITH DIFFERENTIAL (CANCER CENTER ONLY)
Abs Immature Granulocytes: 0.11 10*3/uL — ABNORMAL HIGH (ref 0.00–0.07)
Basophils Absolute: 0.1 10*3/uL (ref 0.0–0.1)
Basophils Relative: 1 %
Eosinophils Absolute: 0.2 10*3/uL (ref 0.0–0.5)
Eosinophils Relative: 1 %
HCT: 23.3 % — ABNORMAL LOW (ref 36.0–46.0)
Hemoglobin: 6.9 g/dL — CL (ref 12.0–15.0)
Immature Granulocytes: 1 %
Lymphocytes Relative: 13 %
Lymphs Abs: 1.4 10*3/uL (ref 0.7–4.0)
MCH: 27 pg (ref 26.0–34.0)
MCHC: 29.6 g/dL — ABNORMAL LOW (ref 30.0–36.0)
MCV: 91 fL (ref 80.0–100.0)
Monocytes Absolute: 0.7 10*3/uL (ref 0.1–1.0)
Monocytes Relative: 6 %
Neutro Abs: 8.9 10*3/uL — ABNORMAL HIGH (ref 1.7–7.7)
Neutrophils Relative %: 78 %
Platelet Count: 292 10*3/uL (ref 150–400)
RBC: 2.56 MIL/uL — ABNORMAL LOW (ref 3.87–5.11)
RDW: 19.6 % — ABNORMAL HIGH (ref 11.5–15.5)
WBC Count: 11.3 10*3/uL — ABNORMAL HIGH (ref 4.0–10.5)
nRBC: 0.4 % — ABNORMAL HIGH (ref 0.0–0.2)

## 2023-03-11 LAB — SAMPLE TO BLOOD BANK

## 2023-03-11 LAB — BPAM RBC
Blood Product Expiration Date: 202405292359
ISSUE DATE / TIME: 202405081358
Unit Type and Rh: 6200

## 2023-03-11 LAB — CMP (CANCER CENTER ONLY)
ALT: 10 U/L (ref 0–44)
AST: 15 U/L (ref 15–41)
Albumin: 3.5 g/dL (ref 3.5–5.0)
Alkaline Phosphatase: 131 U/L — ABNORMAL HIGH (ref 38–126)
Anion gap: 7 (ref 5–15)
BUN: 24 mg/dL — ABNORMAL HIGH (ref 8–23)
CO2: 23 mmol/L (ref 22–32)
Calcium: 8.7 mg/dL — ABNORMAL LOW (ref 8.9–10.3)
Chloride: 109 mmol/L (ref 98–111)
Creatinine: 1.74 mg/dL — ABNORMAL HIGH (ref 0.44–1.00)
GFR, Estimated: 32 mL/min — ABNORMAL LOW (ref >60)
Glucose, Bld: 105 mg/dL — ABNORMAL HIGH (ref 70–99)
Potassium: 4.4 mmol/L (ref 3.5–5.1)
Sodium: 139 mmol/L (ref 135–145)
Total Bilirubin: 0.3 mg/dL (ref 0.3–1.2)
Total Protein: 7.1 g/dL (ref 6.5–8.1)

## 2023-03-11 LAB — TYPE AND SCREEN
Antibody Screen: NEGATIVE
Unit division: 0

## 2023-03-11 LAB — FERRITIN: Ferritin: 13 ng/mL (ref 11–307)

## 2023-03-11 LAB — CEA (IN HOUSE-CHCC): CEA (CHCC-In House): 239.92 ng/mL — ABNORMAL HIGH (ref 0.00–5.00)

## 2023-03-11 LAB — PREPARE RBC (CROSSMATCH)

## 2023-03-11 MED ORDER — SODIUM CHLORIDE 0.9% IV SOLUTION
250.0000 mL | Freq: Once | INTRAVENOUS | Status: AC
  Administered 2023-03-11: 250 mL via INTRAVENOUS

## 2023-03-11 NOTE — Progress Notes (Signed)
CRITICAL VALUE STICKER  CRITICAL VALUE: Hgb 6.9  RECEIVER (on-site recipient of call): Elease Etienne, RN  DATE & TIME NOTIFIED: 03/11/23 1300  MESSENGER (representative from lab): Maceo Pro  MD NOTIFIED: Mosetta Putt  TIME OF NOTIFICATION: 1315  RESPONSE:   2 Units ordered per Protocol

## 2023-03-11 NOTE — Progress Notes (Signed)
Per Dr Mosetta Putt, ok to run second unit of blood at 300 ml/hr.

## 2023-03-11 NOTE — Patient Instructions (Signed)
Blood Transfusion, Adult, Care After The following information offers guidance on how to care for yourself after your procedure. Your health care provider may also give you more specific instructions. If you have problems or questions, contact your health care provider. What can I expect after the procedure? After the procedure, it is common to have: Bruising and soreness where the IV was inserted. A headache. Follow these instructions at home: IV insertion site care     Follow instructions from your health care provider about how to take care of your IV insertion site. Make sure you: Wash your hands with soap and water for at least 20 seconds before and after you change your bandage (dressing). If soap and water are not available, use hand sanitizer. Change your dressing as told by your health care provider. Check your IV insertion site every day for signs of infection. Check for: Redness, swelling, or pain. Bleeding from the site. Warmth. Pus or a bad smell. General instructions Take over-the-counter and prescription medicines only as told by your health care provider. Rest as told by your health care provider. Return to your normal activities as told by your health care provider. Keep all follow-up visits. Lab tests may need to be done at certain periods to recheck your blood counts. Contact a health care provider if: You have itching or red, swollen areas of skin (hives). You have a fever or chills. You have pain in the head, back, or chest. You feel anxious or you feel weak after doing your normal activities. You have redness, swelling, warmth, or pain around the IV insertion site. You have blood coming from the IV insertion site that does not stop with pressure. You have pus or a bad smell coming from your IV insertion site. If you received your blood transfusion in an outpatient setting, you will be told whom to contact to report any reactions. Get help right away if: You  have symptoms of a serious allergic or immune system reaction, including: Trouble breathing or shortness of breath. Swelling of the face, feeling flushed, or widespread rash. Dark urine or blood in the urine. Fast heartbeat. These symptoms may be an emergency. Get help right away. Call 911. Do not wait to see if the symptoms will go away. Do not drive yourself to the hospital. Summary Bruising and soreness around the IV insertion site are common. Check your IV insertion site every day for signs of infection. Rest as told by your health care provider. Return to your normal activities as told by your health care provider. Get help right away for symptoms of a serious allergic or immune system reaction to the blood transfusion. This information is not intended to replace advice given to you by your health care provider. Make sure you discuss any questions you have with your health care provider. Document Revised: 01/17/2022 Document Reviewed: 01/17/2022 Elsevier Patient Education  2023 [employer]  

## 2023-03-12 LAB — BPAM RBC
Blood Product Expiration Date: 202405292359
ISSUE DATE / TIME: 202405081358
Unit Type and Rh: 6200

## 2023-03-12 LAB — TYPE AND SCREEN
ABO/RH(D): A POS
Unit division: 0

## 2023-04-01 ENCOUNTER — Inpatient Hospital Stay: Payer: 59

## 2023-04-01 ENCOUNTER — Other Ambulatory Visit: Payer: Self-pay

## 2023-04-01 ENCOUNTER — Inpatient Hospital Stay (HOSPITAL_BASED_OUTPATIENT_CLINIC_OR_DEPARTMENT_OTHER): Payer: 59 | Admitting: Hematology

## 2023-04-01 VITALS — BP 109/61 | HR 72 | Temp 98.4°F | Resp 18 | Ht 63.0 in | Wt 200.6 lb

## 2023-04-01 DIAGNOSIS — C189 Malignant neoplasm of colon, unspecified: Secondary | ICD-10-CM | POA: Diagnosis not present

## 2023-04-01 DIAGNOSIS — D500 Iron deficiency anemia secondary to blood loss (chronic): Secondary | ICD-10-CM | POA: Diagnosis not present

## 2023-04-01 DIAGNOSIS — Z66 Do not resuscitate: Secondary | ICD-10-CM | POA: Insufficient documentation

## 2023-04-01 DIAGNOSIS — C787 Secondary malignant neoplasm of liver and intrahepatic bile duct: Secondary | ICD-10-CM

## 2023-04-01 DIAGNOSIS — D649 Anemia, unspecified: Secondary | ICD-10-CM

## 2023-04-01 LAB — CBC WITH DIFFERENTIAL (CANCER CENTER ONLY)
Abs Immature Granulocytes: 0.07 10*3/uL (ref 0.00–0.07)
Basophils Absolute: 0.1 10*3/uL (ref 0.0–0.1)
Basophils Relative: 1 %
Eosinophils Absolute: 0.2 10*3/uL (ref 0.0–0.5)
Eosinophils Relative: 3 %
HCT: 22.7 % — ABNORMAL LOW (ref 36.0–46.0)
Hemoglobin: 7.2 g/dL — ABNORMAL LOW (ref 12.0–15.0)
Immature Granulocytes: 1 %
Lymphocytes Relative: 22 %
Lymphs Abs: 1.9 10*3/uL (ref 0.7–4.0)
MCH: 29.1 pg (ref 26.0–34.0)
MCHC: 31.7 g/dL (ref 30.0–36.0)
MCV: 91.9 fL (ref 80.0–100.0)
Monocytes Absolute: 0.7 10*3/uL (ref 0.1–1.0)
Monocytes Relative: 8 %
Neutro Abs: 5.4 10*3/uL (ref 1.7–7.7)
Neutrophils Relative %: 65 %
Platelet Count: 289 10*3/uL (ref 150–400)
RBC: 2.47 MIL/uL — ABNORMAL LOW (ref 3.87–5.11)
RDW: 19.4 % — ABNORMAL HIGH (ref 11.5–15.5)
WBC Count: 8.4 10*3/uL (ref 4.0–10.5)
nRBC: 0.5 % — ABNORMAL HIGH (ref 0.0–0.2)

## 2023-04-01 LAB — CMP (CANCER CENTER ONLY)
ALT: 12 U/L (ref 0–44)
AST: 14 U/L — ABNORMAL LOW (ref 15–41)
Albumin: 3.3 g/dL — ABNORMAL LOW (ref 3.5–5.0)
Alkaline Phosphatase: 120 U/L (ref 38–126)
Anion gap: 8 (ref 5–15)
BUN: 25 mg/dL — ABNORMAL HIGH (ref 8–23)
CO2: 23 mmol/L (ref 22–32)
Calcium: 8.4 mg/dL — ABNORMAL LOW (ref 8.9–10.3)
Chloride: 106 mmol/L (ref 98–111)
Creatinine: 1.81 mg/dL — ABNORMAL HIGH (ref 0.44–1.00)
GFR, Estimated: 30 mL/min — ABNORMAL LOW (ref >60)
Glucose, Bld: 95 mg/dL (ref 70–99)
Potassium: 4.6 mmol/L (ref 3.5–5.1)
Sodium: 137 mmol/L (ref 135–145)
Total Bilirubin: 0.3 mg/dL (ref 0.3–1.2)
Total Protein: 6.8 g/dL (ref 6.5–8.1)

## 2023-04-01 LAB — SAMPLE TO BLOOD BANK

## 2023-04-01 LAB — FERRITIN: Ferritin: 18 ng/mL (ref 11–307)

## 2023-04-01 LAB — TYPE AND SCREEN: ABO/RH(D): A POS

## 2023-04-01 LAB — PREPARE RBC (CROSSMATCH)

## 2023-04-01 LAB — BPAM RBC
Blood Product Expiration Date: 202406202359
ISSUE DATE / TIME: 202405291546
Unit Type and Rh: 6200

## 2023-04-01 MED ORDER — SODIUM CHLORIDE 0.9% IV SOLUTION
250.0000 mL | Freq: Once | INTRAVENOUS | Status: AC
  Administered 2023-04-01: 250 mL via INTRAVENOUS

## 2023-04-01 NOTE — Assessment & Plan Note (Signed)
-  h/o anemia for many years, previously received IV Feraheme in 05/2019. -denies hematochezia or other bleeding. Recall she has not had any treatment for her colon cancer, in part due to ankle fracture.  Her anemia is likely related to the slow bleeding from her colon cancer, and iron deficiency. -she has required intermittent blood transfusion. 

## 2023-04-01 NOTE — Progress Notes (Signed)
Per Dr. Mosetta Putt OK to transfuse PRBCs at 300 mL/hr today.
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
## 2023-04-01 NOTE — Assessment & Plan Note (Signed)
-  We previously discussed the incurable nature of her cancer, and the overall poor prognosis, also her cancer seems to be not very aggressive, she is not symptomatic from cancer. -The patient understands the goal of care is palliative. -I recommend DNR/DNI, she agreed, I documented in her chart

## 2023-04-01 NOTE — Patient Instructions (Signed)

## 2023-04-02 ENCOUNTER — Telehealth: Payer: Self-pay | Admitting: Hematology

## 2023-04-02 LAB — TYPE AND SCREEN
Antibody Screen: NEGATIVE
Unit division: 0

## 2023-04-02 LAB — BPAM RBC

## 2023-04-02 NOTE — Telephone Encounter (Signed)
Contacted patient to scheduled appointments. Patient is aware of appointments that are scheduled.   

## 2023-04-09 ENCOUNTER — Other Ambulatory Visit: Payer: Self-pay

## 2023-04-10 ENCOUNTER — Inpatient Hospital Stay: Payer: 59 | Attending: Hematology

## 2023-04-10 ENCOUNTER — Other Ambulatory Visit: Payer: Self-pay

## 2023-04-10 VITALS — BP 124/69 | HR 84 | Temp 98.2°F | Resp 16

## 2023-04-10 DIAGNOSIS — Z79899 Other long term (current) drug therapy: Secondary | ICD-10-CM | POA: Diagnosis not present

## 2023-04-10 DIAGNOSIS — C787 Secondary malignant neoplasm of liver and intrahepatic bile duct: Secondary | ICD-10-CM | POA: Diagnosis not present

## 2023-04-10 DIAGNOSIS — C182 Malignant neoplasm of ascending colon: Secondary | ICD-10-CM | POA: Insufficient documentation

## 2023-04-10 DIAGNOSIS — D500 Iron deficiency anemia secondary to blood loss (chronic): Secondary | ICD-10-CM | POA: Insufficient documentation

## 2023-04-10 MED ORDER — LORATADINE 10 MG PO TABS
10.0000 mg | ORAL_TABLET | Freq: Once | ORAL | Status: AC
  Administered 2023-04-10: 10 mg via ORAL
  Filled 2023-04-10: qty 1

## 2023-04-10 MED ORDER — SODIUM CHLORIDE 0.9 % IV SOLN
400.0000 mg | Freq: Once | INTRAVENOUS | Status: AC
  Administered 2023-04-10: 400 mg via INTRAVENOUS
  Filled 2023-04-10: qty 400

## 2023-04-10 MED ORDER — SODIUM CHLORIDE 0.9 % IV SOLN: Freq: Once | INTRAVENOUS | Status: AC

## 2023-04-10 NOTE — Progress Notes (Signed)
Pt declined to be observed for 30 minutes post Venofer infusion. Pt tolerated trtmt well w/out incident. VSS at discharge.  W/C assist to lobby.   

## 2023-04-10 NOTE — Patient Instructions (Signed)

## 2023-04-24 ENCOUNTER — Inpatient Hospital Stay: Payer: 59

## 2023-04-24 ENCOUNTER — Other Ambulatory Visit: Payer: Self-pay

## 2023-04-24 DIAGNOSIS — D649 Anemia, unspecified: Secondary | ICD-10-CM

## 2023-04-24 DIAGNOSIS — C189 Malignant neoplasm of colon, unspecified: Secondary | ICD-10-CM

## 2023-04-24 DIAGNOSIS — D500 Iron deficiency anemia secondary to blood loss (chronic): Secondary | ICD-10-CM

## 2023-04-24 LAB — CMP (CANCER CENTER ONLY)
ALT: 14 U/L (ref 0–44)
AST: 20 U/L (ref 15–41)
Albumin: 2.8 g/dL — ABNORMAL LOW (ref 3.5–5.0)
Alkaline Phosphatase: 156 U/L — ABNORMAL HIGH (ref 38–126)
Anion gap: 7 (ref 5–15)
BUN: 21 mg/dL (ref 8–23)
CO2: 23 mmol/L (ref 22–32)
Calcium: 8.3 mg/dL — ABNORMAL LOW (ref 8.9–10.3)
Chloride: 109 mmol/L (ref 98–111)
Creatinine: 1.75 mg/dL — ABNORMAL HIGH (ref 0.44–1.00)
GFR, Estimated: 31 mL/min — ABNORMAL LOW (ref >60)
Glucose, Bld: 140 mg/dL — ABNORMAL HIGH (ref 70–99)
Potassium: 4.3 mmol/L (ref 3.5–5.1)
Sodium: 139 mmol/L (ref 135–145)
Total Bilirubin: 0.3 mg/dL (ref 0.3–1.2)
Total Protein: 6.1 g/dL — ABNORMAL LOW (ref 6.5–8.1)

## 2023-04-24 LAB — CBC WITH DIFFERENTIAL (CANCER CENTER ONLY)
Abs Immature Granulocytes: 0.21 10*3/uL — ABNORMAL HIGH (ref 0.00–0.07)
Basophils Absolute: 0.1 10*3/uL (ref 0.0–0.1)
Basophils Relative: 1 %
Eosinophils Absolute: 0.2 10*3/uL (ref 0.0–0.5)
Eosinophils Relative: 2 %
HCT: 23 % — ABNORMAL LOW (ref 36.0–46.0)
Hemoglobin: 6.9 g/dL — CL (ref 12.0–15.0)
Immature Granulocytes: 2 %
Lymphocytes Relative: 18 %
Lymphs Abs: 1.7 10*3/uL (ref 0.7–4.0)
MCH: 29 pg (ref 26.0–34.0)
MCHC: 30 g/dL (ref 30.0–36.0)
MCV: 96.6 fL (ref 80.0–100.0)
Monocytes Absolute: 0.6 10*3/uL (ref 0.1–1.0)
Monocytes Relative: 7 %
Neutro Abs: 6.8 10*3/uL (ref 1.7–7.7)
Neutrophils Relative %: 70 %
Platelet Count: 270 10*3/uL (ref 150–400)
RBC: 2.38 MIL/uL — ABNORMAL LOW (ref 3.87–5.11)
RDW: 20.2 % — ABNORMAL HIGH (ref 11.5–15.5)
WBC Count: 9.5 10*3/uL (ref 4.0–10.5)
nRBC: 0.7 % — ABNORMAL HIGH (ref 0.0–0.2)

## 2023-04-24 LAB — BPAM RBC
Blood Product Expiration Date: 202407112359
ISSUE DATE / TIME: 202406211411
Unit Type and Rh: 6200

## 2023-04-24 LAB — SAMPLE TO BLOOD BANK

## 2023-04-24 LAB — TYPE AND SCREEN

## 2023-04-24 LAB — FERRITIN: Ferritin: 82 ng/mL (ref 11–307)

## 2023-04-24 LAB — CEA (IN HOUSE-CHCC): CEA (CHCC-In House): 262.62 ng/mL — ABNORMAL HIGH (ref 0.00–5.00)

## 2023-04-24 LAB — PREPARE RBC (CROSSMATCH)

## 2023-04-24 MED ORDER — SODIUM CHLORIDE 0.9% IV SOLUTION
250.0000 mL | Freq: Once | INTRAVENOUS | Status: AC
  Administered 2023-04-24: 250 mL via INTRAVENOUS

## 2023-04-24 MED FILL — Iron Sucrose Inj 20 MG/ML (Fe Equiv): INTRAVENOUS | Qty: 20 | Status: AC

## 2023-04-24 NOTE — Progress Notes (Signed)
Transfuse [facility] order entered for blood transfusion scheduled for 04/25/23.

## 2023-04-24 NOTE — Patient Instructions (Signed)
Blood Transfusion, Adult A blood transfusion is a procedure in which you receive blood or a type of blood cell (blood component) through an IV. You may need a blood transfusion when you have a low blood count, which is a low number of any blood cell. This may result from a bleeding disorder, illness, injury, or surgery. The blood may come from a donor, or you may be able to have your own blood collected and stored (autologous blood donation) before a planned surgery. The blood given in a transfusion may be made up of different blood components. You may receive: Red blood cells. These carry oxygen to the cells in the body. Platelets. These help your blood to clot. Plasma. This is the liquid part of your blood. It carries proteins and other substances throughout the body. White blood cells. These help you fight infections. If you have hemophilia or another clotting disorder, you may also receive other types of blood products. Depending on the type of blood product, this procedure may take 1-4 hours to complete. Tell a health care provider about: Any bleeding problems you have. Any previous reactions you have had during a blood transfusion. Any allergies you have. All medicines you are taking, including vitamins, herbs, eye drops, creams, and over-the-counter medicines. Any surgeries you have had. Any medical conditions you have. Whether you are pregnant or may be pregnant. What are the risks? Talk with your health care provider about risks. The most common problems include: A mild allergic reaction, such as red, swollen areas of skin (hives) and itching. Fever or chills. This may be the body's response to new blood cells received. This may occur during or up to 4 hours after the transfusion. More serious problems may include: A serious allergic reaction that causes difficulty breathing or swelling around the face and lips. Transfusion-associated circulatory overload (TACO), or too much fluid in  the lungs. This may cause breathing problems. Transfusion-related acute lung injury (TRALI), which causes breathing difficulty and low oxygen in the blood. This can occur within hours of the transfusion or several days later. Iron overload. This can happen after receiving many blood transfusions over a period of time. Infection or virus being transmitted. This is rare because donated blood is carefully tested before it is given. Hemolytic transfusion reaction. This is rare. It happens when the body's defense system (immune system)tries to attack the new blood cells. Symptoms may include fever, chills, nausea, low blood pressure, and low back or chest pain. Transfusion-associated graft-versus-host disease (TAGVHD). This is rare. It happens when donated cells attack the body's healthy tissues. What happens before the procedure? You will have a blood test to check your blood type. This test is done to know what kind of blood your body will accept and to match it to the donor blood. If you are going to have a planned surgery, you may be able to do an autologous blood donation. This may be done in case you need to have a transfusion. You will have your temperature, blood pressure, and pulse checked before the transfusion. If you have had an allergic reaction to a transfusion in the past, you may be given medicine to help prevent a reaction. This medicine may be given to you by mouth (orally) or through an IV. What happens during the procedure?  An IV will be inserted into one of your veins. The bag of blood will be attached to your IV. The blood will then enter through your vein. Your temperature, blood pressure,   and pulse will be monitored during the transfusion. This monitoring is done to detect early signs of a transfusion reaction. Tell your nurse right away if you have any of these symptoms during the transfusion: Shortness of breath or trouble breathing. Chest or back pain. Fever or  chills. Itching or hives. If you have any signs or symptoms of a reaction, your transfusion will be stopped and you may be given medicine. When the transfusion is complete, your IV will be removed. Pressure may be applied to the IV site for a few minutes. A bandage (dressing)will be applied. The procedure may vary among health care providers and hospitals. What happens after the procedure? Your temperature, blood pressure, pulse, breathing rate, and blood oxygen level will be monitored until you leave the hospital or clinic. Your blood may be tested to see how you have responded to the transfusion. You may be warmed with fluids or blankets to maintain a normal body temperature. If you receive your blood transfusion in an outpatient setting, you will be told whom to contact to report any reactions. Where to find more information Visit the [employer]: redcross.org Summary A blood transfusion is a procedure in which you receive blood or a type of blood cell (blood component) through an IV. The blood given in a transfusion may be made up of different blood components. You may receive red blood cells, platelets, plasma, or white blood cells depending on the condition treated. Your temperature, blood pressure, and pulse will be monitored before, during, and after the transfusion. After the transfusion, your blood may be tested to see how your body has responded. This information is not intended to replace advice given to you by your health care provider. Make sure you discuss any questions you have with your health care provider. Document Revised: 01/17/2022 Document Reviewed: 01/17/2022 Elsevier Patient Education  2024 Elsevier Inc.   

## 2023-04-25 ENCOUNTER — Inpatient Hospital Stay: Payer: 59

## 2023-04-25 DIAGNOSIS — D500 Iron deficiency anemia secondary to blood loss (chronic): Secondary | ICD-10-CM | POA: Diagnosis not present

## 2023-04-25 DIAGNOSIS — D649 Anemia, unspecified: Secondary | ICD-10-CM

## 2023-04-25 LAB — BPAM RBC: Blood Product Expiration Date: 202407112359

## 2023-04-25 LAB — TYPE AND SCREEN: ABO/RH(D): A POS

## 2023-04-25 MED ORDER — SODIUM CHLORIDE 0.9% IV SOLUTION
250.0000 mL | Freq: Once | INTRAVENOUS | Status: AC
  Administered 2023-04-25: 250 mL via INTRAVENOUS

## 2023-04-25 NOTE — Patient Instructions (Signed)
Blood Transfusion, Adult A blood transfusion is a procedure in which you receive blood or a type of blood cell (blood component) through an IV. You may need a blood transfusion when you have a low blood count, which is a low number of any blood cell. This may result from a bleeding disorder, illness, injury, or surgery. The blood may come from a donor, or you may be able to have your own blood collected and stored (autologous blood donation) before a planned surgery. The blood given in a transfusion may be made up of different blood components. You may receive: Red blood cells. These carry oxygen to the cells in the body. Platelets. These help your blood to clot. Plasma. This is the liquid part of your blood. It carries proteins and other substances throughout the body. White blood cells. These help you fight infections. If you have hemophilia or another clotting disorder, you may also receive other types of blood products. Depending on the type of blood product, this procedure may take 1-4 hours to complete. Tell a health care provider about: Any bleeding problems you have. Any previous reactions you have had during a blood transfusion. Any allergies you have. All medicines you are taking, including vitamins, herbs, eye drops, creams, and over-the-counter medicines. Any surgeries you have had. Any medical conditions you have. Whether you are pregnant or may be pregnant. What are the risks? Talk with your health care provider about risks. The most common problems include: A mild allergic reaction, such as red, swollen areas of skin (hives) and itching. Fever or chills. This may be the body's response to new blood cells received. This may occur during or up to 4 hours after the transfusion. More serious problems may include: A serious allergic reaction that causes difficulty breathing or swelling around the face and lips. Transfusion-associated circulatory overload (TACO), or too much fluid in  the lungs. This may cause breathing problems. Transfusion-related acute lung injury (TRALI), which causes breathing difficulty and low oxygen in the blood. This can occur within hours of the transfusion or several days later. Iron overload. This can happen after receiving many blood transfusions over a period of time. Infection or virus being transmitted. This is rare because donated blood is carefully tested before it is given. Hemolytic transfusion reaction. This is rare. It happens when the body's defense system (immune system)tries to attack the new blood cells. Symptoms may include fever, chills, nausea, low blood pressure, and low back or chest pain. Transfusion-associated graft-versus-host disease (TAGVHD). This is rare. It happens when donated cells attack the body's healthy tissues. What happens before the procedure? You will have a blood test to check your blood type. This test is done to know what kind of blood your body will accept and to match it to the donor blood. If you are going to have a planned surgery, you may be able to do an autologous blood donation. This may be done in case you need to have a transfusion. You will have your temperature, blood pressure, and pulse checked before the transfusion. If you have had an allergic reaction to a transfusion in the past, you may be given medicine to help prevent a reaction. This medicine may be given to you by mouth (orally) or through an IV. What happens during the procedure?  An IV will be inserted into one of your veins. The bag of blood will be attached to your IV. The blood will then enter through your vein. Your temperature, blood pressure,   and pulse will be monitored during the transfusion. This monitoring is done to detect early signs of a transfusion reaction. Tell your nurse right away if you have any of these symptoms during the transfusion: Shortness of breath or trouble breathing. Chest or back pain. Fever or  chills. Itching or hives. If you have any signs or symptoms of a reaction, your transfusion will be stopped and you may be given medicine. When the transfusion is complete, your IV will be removed. Pressure may be applied to the IV site for a few minutes. A bandage (dressing)will be applied. The procedure may vary among health care providers and hospitals. What happens after the procedure? Your temperature, blood pressure, pulse, breathing rate, and blood oxygen level will be monitored until you leave the hospital or clinic. Your blood may be tested to see how you have responded to the transfusion. You may be warmed with fluids or blankets to maintain a normal body temperature. If you receive your blood transfusion in an outpatient setting, you will be told whom to contact to report any reactions. Where to find more information Visit the [employer]: redcross.org Summary A blood transfusion is a procedure in which you receive blood or a type of blood cell (blood component) through an IV. The blood given in a transfusion may be made up of different blood components. You may receive red blood cells, platelets, plasma, or white blood cells depending on the condition treated. Your temperature, blood pressure, and pulse will be monitored before, during, and after the transfusion. After the transfusion, your blood may be tested to see how your body has responded. This information is not intended to replace advice given to you by your health care provider. Make sure you discuss any questions you have with your health care provider. Document Revised: 01/17/2022 Document Reviewed: 01/17/2022 Elsevier Patient Education  2024 Elsevier Inc.   

## 2023-04-27 LAB — BPAM RBC
ISSUE DATE / TIME: 202406220839
Unit Type and Rh: 6200

## 2023-04-27 LAB — TYPE AND SCREEN
Antibody Screen: NEGATIVE
Unit division: 0
Unit division: 0

## 2023-04-28 ENCOUNTER — Other Ambulatory Visit: Payer: Self-pay

## 2023-04-28 ENCOUNTER — Inpatient Hospital Stay: Payer: 59

## 2023-04-28 VITALS — BP 114/60 | HR 79 | Temp 97.8°F | Resp 16

## 2023-04-28 DIAGNOSIS — D500 Iron deficiency anemia secondary to blood loss (chronic): Secondary | ICD-10-CM

## 2023-04-28 MED ORDER — METHYLPREDNISOLONE SODIUM SUCC 125 MG IJ SOLR: 125.0000 mg | Freq: Once | INTRAMUSCULAR | Status: DC | PRN

## 2023-04-28 MED ORDER — ALBUTEROL SULFATE HFA 108 (90 BASE) MCG/ACT IN AERS: 2.0000 | INHALATION_SPRAY | Freq: Once | RESPIRATORY_TRACT | Status: DC | PRN

## 2023-04-28 MED ORDER — EPINEPHRINE 0.3 MG/0.3ML IJ SOAJ: 0.3000 mg | Freq: Once | INTRAMUSCULAR | Status: DC | PRN

## 2023-04-28 MED ORDER — SODIUM CHLORIDE 0.9 % IV SOLN: Freq: Once | INTRAVENOUS | Status: DC | PRN

## 2023-04-28 MED ORDER — SODIUM CHLORIDE 0.9 % IV SOLN: Freq: Once | INTRAVENOUS | Status: AC

## 2023-04-28 MED ORDER — SODIUM CHLORIDE 0.9 % IV SOLN
400.0000 mg | Freq: Once | INTRAVENOUS | Status: AC
  Administered 2023-04-28: 400 mg via INTRAVENOUS
  Filled 2023-04-28: qty 400

## 2023-04-28 MED ORDER — LORATADINE 10 MG PO TABS
10.0000 mg | ORAL_TABLET | Freq: Once | ORAL | Status: AC
  Administered 2023-04-28: 10 mg via ORAL
  Filled 2023-04-28: qty 1

## 2023-04-28 MED ORDER — DIPHENHYDRAMINE HCL 50 MG/ML IJ SOLN: 50.0000 mg | Freq: Once | INTRAMUSCULAR | Status: DC | PRN

## 2023-04-28 MED ORDER — FAMOTIDINE IN NACL 20-0.9 MG/50ML-% IV SOLN: 20.0000 mg | Freq: Once | INTRAVENOUS | Status: DC | PRN

## 2023-04-28 NOTE — Patient Instructions (Signed)

## 2023-04-28 NOTE — Progress Notes (Signed)
Per Santiago Glad, NP- patient to receive iron today with ferritin level of 82 on 04/24/23. (Parameters state "for less than 50)  Patient has had iron before and tolerated well- declined to wait full 3o minutes. VSS_ BP 114/60 (BP Location: Left Arm, Patient Position: Sitting)   Pulse 79   Temp 97.8 F (36.6 C) (Oral)   Resp 16   SpO2 97%   WC to lobby- sister taking her home.

## 2023-05-06 ENCOUNTER — Ambulatory Visit: Payer: 59 | Admitting: Podiatry

## 2023-05-15 ENCOUNTER — Other Ambulatory Visit: Payer: Self-pay

## 2023-05-15 ENCOUNTER — Inpatient Hospital Stay: Payer: 59

## 2023-05-15 ENCOUNTER — Inpatient Hospital Stay: Payer: 59 | Attending: Hematology

## 2023-05-15 DIAGNOSIS — C189 Malignant neoplasm of colon, unspecified: Secondary | ICD-10-CM

## 2023-05-15 DIAGNOSIS — D500 Iron deficiency anemia secondary to blood loss (chronic): Secondary | ICD-10-CM

## 2023-05-15 DIAGNOSIS — D649 Anemia, unspecified: Secondary | ICD-10-CM

## 2023-05-15 LAB — CBC WITH DIFFERENTIAL (CANCER CENTER ONLY)
Abs Immature Granulocytes: 0.21 10*3/uL — ABNORMAL HIGH (ref 0.00–0.07)
Basophils Absolute: 0.1 10*3/uL (ref 0.0–0.1)
Basophils Relative: 1 %
Eosinophils Absolute: 0.2 10*3/uL (ref 0.0–0.5)
Eosinophils Relative: 2 %
HCT: 23.4 % — ABNORMAL LOW (ref 36.0–46.0)
Hemoglobin: 7.4 g/dL — ABNORMAL LOW (ref 12.0–15.0)
Immature Granulocytes: 2 %
Lymphocytes Relative: 22 %
Lymphs Abs: 2.2 10*3/uL (ref 0.7–4.0)
MCH: 31.1 pg (ref 26.0–34.0)
MCHC: 31.6 g/dL (ref 30.0–36.0)
MCV: 98.3 fL (ref 80.0–100.0)
Monocytes Absolute: 0.9 10*3/uL (ref 0.1–1.0)
Monocytes Relative: 9 %
Neutro Abs: 6.4 10*3/uL (ref 1.7–7.7)
Neutrophils Relative %: 64 %
Platelet Count: 282 10*3/uL (ref 150–400)
RBC: 2.38 MIL/uL — ABNORMAL LOW (ref 3.87–5.11)
RDW: 20.9 % — ABNORMAL HIGH (ref 11.5–15.5)
WBC Count: 10 10*3/uL (ref 4.0–10.5)
nRBC: 0.7 % — ABNORMAL HIGH (ref 0.0–0.2)

## 2023-05-15 LAB — TYPE AND SCREEN: ABO/RH(D): A POS

## 2023-05-15 LAB — CMP (CANCER CENTER ONLY)
ALT: 15 U/L (ref 0–44)
AST: 20 U/L (ref 15–41)
Albumin: 3.1 g/dL — ABNORMAL LOW (ref 3.5–5.0)
Alkaline Phosphatase: 179 U/L — ABNORMAL HIGH (ref 38–126)
Anion gap: 7 (ref 5–15)
BUN: 27 mg/dL — ABNORMAL HIGH (ref 8–23)
CO2: 22 mmol/L (ref 22–32)
Calcium: 8.9 mg/dL (ref 8.9–10.3)
Chloride: 107 mmol/L (ref 98–111)
Creatinine: 1.77 mg/dL — ABNORMAL HIGH (ref 0.44–1.00)
GFR, Estimated: 31 mL/min — ABNORMAL LOW (ref >60)
Glucose, Bld: 87 mg/dL (ref 70–99)
Potassium: 5 mmol/L (ref 3.5–5.1)
Sodium: 136 mmol/L (ref 135–145)
Total Bilirubin: 0.3 mg/dL (ref 0.3–1.2)
Total Protein: 6.7 g/dL (ref 6.5–8.1)

## 2023-05-15 LAB — SAMPLE TO BLOOD BANK

## 2023-05-15 LAB — PREPARE RBC (CROSSMATCH)

## 2023-05-15 LAB — FERRITIN: Ferritin: 166 ng/mL (ref 11–307)

## 2023-05-15 LAB — BPAM RBC
Blood Product Expiration Date: 202408072359
ISSUE DATE / TIME: 202407121413

## 2023-05-15 MED ORDER — SODIUM CHLORIDE 0.9% IV SOLUTION: 250.0000 mL | Freq: Once | INTRAVENOUS | Status: DC

## 2023-05-16 LAB — TYPE AND SCREEN
Antibody Screen: NEGATIVE
Unit division: 0

## 2023-05-16 LAB — BPAM RBC: Unit Type and Rh: 6200

## 2023-05-19 ENCOUNTER — Ambulatory Visit (INDEPENDENT_AMBULATORY_CARE_PROVIDER_SITE_OTHER): Payer: 59 | Admitting: Podiatry

## 2023-05-19 ENCOUNTER — Encounter: Payer: Self-pay | Admitting: Podiatry

## 2023-05-19 DIAGNOSIS — B351 Tinea unguium: Secondary | ICD-10-CM

## 2023-05-19 DIAGNOSIS — M79674 Pain in right toe(s): Secondary | ICD-10-CM | POA: Diagnosis not present

## 2023-05-19 DIAGNOSIS — D689 Coagulation defect, unspecified: Secondary | ICD-10-CM

## 2023-05-19 DIAGNOSIS — M79675 Pain in left toe(s): Secondary | ICD-10-CM

## 2023-05-19 NOTE — Progress Notes (Signed)
 This patient returns to my office for at risk foot care.  This patient requires this care by a professional since this patient will be at risk due to having  kidney disease and coagulation defect. Patient is taking eliquis. This patient is unable to cut nails herself since the patient cannot reach her nails.These nails are painful walking and wearing shoes.  This patient presents for at risk foot care today.  General Appearance  Alert, conversant and in no acute stress.  Vascular  Dorsalis pedis and posterior tibial  pulses are palpable  bilaterally.  Capillary return is within normal limits  bilaterally. Temperature is within normal limits  bilaterally.  Neurologic  Senn-Weinstein monofilament wire test within normal limits  bilaterally. Muscle power within normal limits bilaterally.  Nails Thick disfigured discolored nails with subungual debris  from hallux to fifth toes bilaterally. No evidence of bacterial infection or drainage bilaterally.  Orthopedic  No limitations of motion  feet .  No crepitus or effusions noted.  No bony pathology or digital deformities noted.  Skin  normotropic skin with no porokeratosis noted bilaterally.  No signs of infections or ulcers noted.     Onychomycosis  Pain in right toes  Pain in left toes  Consent was obtained for treatment procedures.   Mechanical debridement of nails 1-5  bilaterally performed with a nail nipper.  Filed with dremel without incident. Wore nail polish on her nails so I was limited in dremel tool usage.   Return office visit    3 months                  Told patient to return for periodic foot care and evaluation due to potential at risk complications.   Gardiner Barefoot DPM

## 2023-06-04 ENCOUNTER — Inpatient Hospital Stay: Payer: 59

## 2023-06-04 ENCOUNTER — Other Ambulatory Visit: Payer: Self-pay

## 2023-06-04 ENCOUNTER — Ambulatory Visit (HOSPITAL_COMMUNITY)
Admission: RE | Admit: 2023-06-04 | Discharge: 2023-06-04 | Disposition: A | Discharge status: Home / Self Care | Payer: 59 | Source: Ambulatory Visit | Attending: Hematology | Admitting: Hematology

## 2023-06-04 ENCOUNTER — Encounter: Payer: Self-pay | Admitting: Hematology

## 2023-06-04 ENCOUNTER — Inpatient Hospital Stay: Payer: 59 | Attending: Hematology | Admitting: Hematology

## 2023-06-04 VITALS — BP 99/72 | HR 92 | Temp 98.2°F | Resp 16 | Ht 63.0 in | Wt 194.7 lb

## 2023-06-04 DIAGNOSIS — Z8673 Personal history of transient ischemic attack (TIA), and cerebral infarction without residual deficits: Secondary | ICD-10-CM | POA: Insufficient documentation

## 2023-06-04 DIAGNOSIS — I10 Essential (primary) hypertension: Secondary | ICD-10-CM | POA: Diagnosis not present

## 2023-06-04 DIAGNOSIS — D500 Iron deficiency anemia secondary to blood loss (chronic): Secondary | ICD-10-CM

## 2023-06-04 DIAGNOSIS — D649 Anemia, unspecified: Secondary | ICD-10-CM

## 2023-06-04 DIAGNOSIS — Z79899 Other long term (current) drug therapy: Secondary | ICD-10-CM | POA: Insufficient documentation

## 2023-06-04 DIAGNOSIS — Z87442 Personal history of urinary calculi: Secondary | ICD-10-CM | POA: Diagnosis not present

## 2023-06-04 DIAGNOSIS — K760 Fatty (change of) liver, not elsewhere classified: Secondary | ICD-10-CM | POA: Insufficient documentation

## 2023-06-04 DIAGNOSIS — C189 Malignant neoplasm of colon, unspecified: Secondary | ICD-10-CM | POA: Insufficient documentation

## 2023-06-04 DIAGNOSIS — R059 Cough, unspecified: Secondary | ICD-10-CM | POA: Insufficient documentation

## 2023-06-04 DIAGNOSIS — C787 Secondary malignant neoplasm of liver and intrahepatic bile duct: Secondary | ICD-10-CM

## 2023-06-04 DIAGNOSIS — Z66 Do not resuscitate: Secondary | ICD-10-CM | POA: Insufficient documentation

## 2023-06-04 DIAGNOSIS — E785 Hyperlipidemia, unspecified: Secondary | ICD-10-CM | POA: Diagnosis not present

## 2023-06-04 DIAGNOSIS — G629 Polyneuropathy, unspecified: Secondary | ICD-10-CM | POA: Insufficient documentation

## 2023-06-04 LAB — CMP (CANCER CENTER ONLY)
ALT: 19 U/L (ref 0–44)
AST: 30 U/L (ref 15–41)
Albumin: 3.4 g/dL — ABNORMAL LOW (ref 3.5–5.0)
Alkaline Phosphatase: 209 U/L — ABNORMAL HIGH (ref 38–126)
Anion gap: 10 (ref 5–15)
BUN: 27 mg/dL — ABNORMAL HIGH (ref 8–23)
CO2: 20 mmol/L — ABNORMAL LOW (ref 22–32)
Calcium: 8.8 mg/dL — ABNORMAL LOW (ref 8.9–10.3)
Chloride: 107 mmol/L (ref 98–111)
Creatinine: 1.86 mg/dL — ABNORMAL HIGH (ref 0.44–1.00)
GFR, Estimated: 29 mL/min — ABNORMAL LOW (ref >60)
Glucose, Bld: 109 mg/dL — ABNORMAL HIGH (ref 70–99)
Potassium: 4.6 mmol/L (ref 3.5–5.1)
Sodium: 137 mmol/L (ref 135–145)
Total Bilirubin: 0.4 mg/dL (ref 0.3–1.2)
Total Protein: 7.1 g/dL (ref 6.5–8.1)

## 2023-06-04 LAB — TYPE AND SCREEN
ABO/RH(D): A POS
Antibody Screen: NEGATIVE
Unit division: 0

## 2023-06-04 LAB — FERRITIN: Ferritin: 82 ng/mL (ref 11–307)

## 2023-06-04 LAB — CBC WITH DIFFERENTIAL (CANCER CENTER ONLY)
Abs Immature Granulocytes: 0.1 10*3/uL — ABNORMAL HIGH (ref 0.00–0.07)
Basophils Absolute: 0.1 10*3/uL (ref 0.0–0.1)
Basophils Relative: 1 %
Eosinophils Absolute: 0.2 10*3/uL (ref 0.0–0.5)
Eosinophils Relative: 2 %
HCT: 24.9 % — ABNORMAL LOW (ref 36.0–46.0)
Hemoglobin: 7.9 g/dL — ABNORMAL LOW (ref 12.0–15.0)
Immature Granulocytes: 1 %
Lymphocytes Relative: 21 %
Lymphs Abs: 1.8 10*3/uL (ref 0.7–4.0)
MCH: 31 pg (ref 26.0–34.0)
MCHC: 31.7 g/dL (ref 30.0–36.0)
MCV: 97.6 fL (ref 80.0–100.0)
Monocytes Absolute: 0.7 10*3/uL (ref 0.1–1.0)
Monocytes Relative: 8 %
Neutro Abs: 5.8 10*3/uL (ref 1.7–7.7)
Neutrophils Relative %: 67 %
Platelet Count: 293 10*3/uL (ref 150–400)
RBC: 2.55 MIL/uL — ABNORMAL LOW (ref 3.87–5.11)
RDW: 20.9 % — ABNORMAL HIGH (ref 11.5–15.5)
WBC Count: 8.6 10*3/uL (ref 4.0–10.5)
nRBC: 0.3 % — ABNORMAL HIGH (ref 0.0–0.2)

## 2023-06-04 LAB — PREPARE RBC (CROSSMATCH)

## 2023-06-04 LAB — BPAM RBC
Blood Product Expiration Date: 202408272359
ISSUE DATE / TIME: 202408011359
Unit Type and Rh: 6200

## 2023-06-04 LAB — SAMPLE TO BLOOD BANK

## 2023-06-04 LAB — CEA (IN HOUSE-CHCC): CEA (CHCC-In House): 523.8 ng/mL — ABNORMAL HIGH (ref 0.00–5.00)

## 2023-06-04 MED ORDER — SODIUM CHLORIDE 0.9% IV SOLUTION: 250.0000 mL | Freq: Once | INTRAVENOUS | Status: DC

## 2023-06-04 NOTE — Assessment & Plan Note (Signed)
-  We previously discussed the incurable nature of her cancer, and the overall poor prognosis, also her cancer seems to be not very aggressive, she is not symptomatic from cancer. -The patient understands the goal of care is palliative. -I recommend DNR/DNI, she agreed, I documented in her chart
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
## 2023-06-04 NOTE — Assessment & Plan Note (Signed)
-  h/o anemia for many years, previously received IV Feraheme in 05/2019. -denies hematochezia or other bleeding. Recall she has not had any treatment for her colon cancer, in part due to ankle fracture.  Her anemia is likely related to the slow bleeding from her colon cancer, and iron deficiency. -she has required intermittent blood transfusion. 

## 2023-06-09 ENCOUNTER — Other Ambulatory Visit: Payer: Self-pay

## 2023-06-26 ENCOUNTER — Inpatient Hospital Stay: Payer: 59

## 2023-06-26 ENCOUNTER — Other Ambulatory Visit: Payer: Self-pay

## 2023-06-26 DIAGNOSIS — C189 Malignant neoplasm of colon, unspecified: Secondary | ICD-10-CM

## 2023-06-26 DIAGNOSIS — D500 Iron deficiency anemia secondary to blood loss (chronic): Secondary | ICD-10-CM

## 2023-06-26 DIAGNOSIS — D649 Anemia, unspecified: Secondary | ICD-10-CM

## 2023-06-26 LAB — CBC WITH DIFFERENTIAL (CANCER CENTER ONLY)
Abs Immature Granulocytes: 0.11 10*3/uL — ABNORMAL HIGH (ref 0.00–0.07)
Basophils Absolute: 0.1 10*3/uL (ref 0.0–0.1)
Basophils Relative: 1 %
Eosinophils Absolute: 0.2 10*3/uL (ref 0.0–0.5)
Eosinophils Relative: 2 %
HCT: 23.7 % — ABNORMAL LOW (ref 36.0–46.0)
Hemoglobin: 7.5 g/dL — ABNORMAL LOW (ref 12.0–15.0)
Immature Granulocytes: 1 %
Lymphocytes Relative: 19 %
Lymphs Abs: 1.9 10*3/uL (ref 0.7–4.0)
MCH: 30.6 pg (ref 26.0–34.0)
MCHC: 31.6 g/dL (ref 30.0–36.0)
MCV: 96.7 fL (ref 80.0–100.0)
Monocytes Absolute: 0.5 10*3/uL (ref 0.1–1.0)
Monocytes Relative: 5 %
Neutro Abs: 7.3 10*3/uL (ref 1.7–7.7)
Neutrophils Relative %: 72 %
Platelet Count: 263 10*3/uL (ref 150–400)
RBC: 2.45 MIL/uL — ABNORMAL LOW (ref 3.87–5.11)
RDW: 18.2 % — ABNORMAL HIGH (ref 11.5–15.5)
WBC Count: 10 10*3/uL (ref 4.0–10.5)
nRBC: 0.5 % — ABNORMAL HIGH (ref 0.0–0.2)

## 2023-06-26 LAB — CMP (CANCER CENTER ONLY)
ALT: 17 U/L (ref 0–44)
AST: 20 U/L (ref 15–41)
Albumin: 3.2 g/dL — ABNORMAL LOW (ref 3.5–5.0)
Alkaline Phosphatase: 238 U/L — ABNORMAL HIGH (ref 38–126)
Anion gap: 10 (ref 5–15)
BUN: 22 mg/dL (ref 8–23)
CO2: 22 mmol/L (ref 22–32)
Calcium: 8.8 mg/dL — ABNORMAL LOW (ref 8.9–10.3)
Chloride: 106 mmol/L (ref 98–111)
Creatinine: 1.74 mg/dL — ABNORMAL HIGH (ref 0.44–1.00)
GFR, Estimated: 31 mL/min — ABNORMAL LOW (ref >60)
Glucose, Bld: 137 mg/dL — ABNORMAL HIGH (ref 70–99)
Potassium: 4.1 mmol/L (ref 3.5–5.1)
Sodium: 138 mmol/L (ref 135–145)
Total Bilirubin: 0.4 mg/dL (ref 0.3–1.2)
Total Protein: 6.9 g/dL (ref 6.5–8.1)

## 2023-06-26 LAB — SAMPLE TO BLOOD BANK

## 2023-06-26 LAB — PREPARE RBC (CROSSMATCH)

## 2023-06-26 LAB — FERRITIN: Ferritin: 64 ng/mL (ref 11–307)

## 2023-06-26 MED ORDER — SODIUM CHLORIDE 0.9% IV SOLUTION
250.0000 mL | Freq: Once | INTRAVENOUS | Status: AC
  Administered 2023-06-26: 250 mL via INTRAVENOUS

## 2023-06-26 NOTE — Patient Instructions (Signed)
Blood Transfusion, Adult A blood transfusion is a procedure in which you receive blood through an IV tube. You may need this procedure because of: A bleeding disorder. An illness. An injury. A surgery. The blood may come from someone else (a donor). You may also be able to donate blood for yourself before a surgery. The blood given in a transfusion may be made up of different types of cells. You may get: Red blood cells. These carry oxygen to the cells in the body. Platelets. These help your blood to clot. Plasma. This is the liquid part of your blood. It carries proteins and other substances through the body. White blood cells. These help you fight infections. If you have a clotting disorder, you may also get other types of blood products. Depending on the type of blood product, this procedure may take 1-4 hours to complete. Tell your doctor about: Any bleeding problems you have. Any reactions you have had during a blood transfusion in the past. Any allergies you have. All medicines you are taking, including vitamins, herbs, eye drops, creams, and over-the-counter medicines. Any surgeries you have had. Any medical conditions you have. Whether you are pregnant or may be pregnant. What are the risks? Talk with your health care provider about risks. The most common problems include: A mild allergic reaction. This includes red, swollen areas of skin (hives) and itching. Fever or chills. This may be the body's response to new blood cells received. This may happen during or up to 4 hours after the transfusion. More serious problems may include: A serious allergic reaction. This includes breathing trouble or swelling around the face and lips. Too much fluid in the lungs. This may cause breathing problems. Lung injury. This causes breathing trouble and low oxygen in the blood. This can happen within hours of the transfusion or days later. Too much iron. This can happen after getting many blood  transfusions over a period of time. An infection or virus passed through the blood. This is rare. Donated blood is carefully tested before it is given. Your body's defense system (immune system) trying to attack the new blood cells. This is rare. Symptoms may include fever, chills, nausea, low blood pressure, and low back or chest pain. Donated cells attacking healthy tissues. This is rare. What happens before the procedure? You will have a blood test to find out your blood type. The test also finds out what type of blood your body will accept and matches it to the donor type. If you are going to have a planned surgery, you may be able to donate your own blood. This may be done in case you need a transfusion. You will have your temperature, blood pressure, and pulse checked. You may receive medicine to help prevent an allergic reaction. This may be done if you have had a reaction to a transfusion before. This medicine may be given to you by mouth or through an IV tube. What happens during the procedure?  An IV tube will be put into one of your veins. The bag of blood will be attached to your IV tube. Then, the blood will enter through your vein. Your temperature, blood pressure, and pulse will be checked often. This is done to find early signs of a transfusion reaction. Tell your nurse right away if you have any of these symptoms: Shortness of breath or trouble breathing. Chest or back pain. Fever or chills. Red, swollen areas of skin or itching. If you have any signs   or symptoms of a reaction, your transfusion will be stopped. You may also be given medicine. When the transfusion is finished, your IV tube will be taken out. Pressure may be put on the IV site for a few minutes. A bandage (dressing) will be put on the IV site. The procedure may vary among doctors and hospitals. What happens after the procedure? You will be monitored until you leave the hospital or clinic. This includes  checking your temperature, blood pressure, pulse, breathing rate, and blood oxygen level. Your blood may be tested to see how you have responded to the transfusion. You may be warmed with fluids or blankets. This is done to keep the temperature of your body normal. If you have your procedure in an outpatient setting, you will be told whom to contact to report any reactions. Where to find more information Visit the [employer]: redcross.org Summary A blood transfusion is a procedure in which you receive blood through an IV tube. The blood you are given may be made up of different blood cells. You may receive red blood cells, platelets, plasma, or white blood cells. Your temperature, blood pressure, and pulse will be checked often. After the procedure, your blood may be tested to see how you have responded. This information is not intended to replace advice given to you by your health care provider. Make sure you discuss any questions you have with your health care provider. Document Revised: 01/17/2022 Document Reviewed: 01/17/2022 Elsevier Patient Education  2024 [employer]  

## 2023-06-29 LAB — TYPE AND SCREEN
ABO/RH(D): A POS
Antibody Screen: NEGATIVE
Unit division: 0

## 2023-06-29 LAB — BPAM RBC
Blood Product Expiration Date: 202409182359
ISSUE DATE / TIME: 202408231445
Unit Type and Rh: 6200

## 2023-07-16 ENCOUNTER — Inpatient Hospital Stay: Payer: 59 | Attending: Hematology

## 2023-07-16 ENCOUNTER — Inpatient Hospital Stay: Payer: 59

## 2023-07-16 ENCOUNTER — Other Ambulatory Visit: Payer: Self-pay

## 2023-07-16 DIAGNOSIS — D500 Iron deficiency anemia secondary to blood loss (chronic): Secondary | ICD-10-CM | POA: Insufficient documentation

## 2023-07-16 DIAGNOSIS — D649 Anemia, unspecified: Secondary | ICD-10-CM

## 2023-07-16 DIAGNOSIS — C189 Malignant neoplasm of colon, unspecified: Secondary | ICD-10-CM

## 2023-07-16 DIAGNOSIS — R970 Elevated carcinoembryonic antigen [CEA]: Secondary | ICD-10-CM | POA: Insufficient documentation

## 2023-07-16 DIAGNOSIS — Z79899 Other long term (current) drug therapy: Secondary | ICD-10-CM | POA: Diagnosis not present

## 2023-07-16 LAB — CMP (CANCER CENTER ONLY)
ALT: 17 U/L (ref 0–44)
AST: 22 U/L (ref 15–41)
Albumin: 3.2 g/dL — ABNORMAL LOW (ref 3.5–5.0)
Alkaline Phosphatase: 224 U/L — ABNORMAL HIGH (ref 38–126)
Anion gap: 7 (ref 5–15)
BUN: 22 mg/dL (ref 8–23)
CO2: 23 mmol/L (ref 22–32)
Calcium: 8.9 mg/dL (ref 8.9–10.3)
Chloride: 106 mmol/L (ref 98–111)
Creatinine: 1.78 mg/dL — ABNORMAL HIGH (ref 0.44–1.00)
GFR, Estimated: 31 mL/min — ABNORMAL LOW (ref >60)
Glucose, Bld: 92 mg/dL (ref 70–99)
Potassium: 4.8 mmol/L (ref 3.5–5.1)
Sodium: 136 mmol/L (ref 135–145)
Total Bilirubin: 0.5 mg/dL (ref 0.3–1.2)
Total Protein: 7 g/dL (ref 6.5–8.1)

## 2023-07-16 LAB — CBC WITH DIFFERENTIAL (CANCER CENTER ONLY)
Abs Immature Granulocytes: 0.07 10*3/uL (ref 0.00–0.07)
Basophils Absolute: 0.1 10*3/uL (ref 0.0–0.1)
Basophils Relative: 1 %
Eosinophils Absolute: 0.2 10*3/uL (ref 0.0–0.5)
Eosinophils Relative: 2 %
HCT: 24.4 % — ABNORMAL LOW (ref 36.0–46.0)
Hemoglobin: 7.3 g/dL — ABNORMAL LOW (ref 12.0–15.0)
Immature Granulocytes: 1 %
Lymphocytes Relative: 17 %
Lymphs Abs: 1.7 10*3/uL (ref 0.7–4.0)
MCH: 29.6 pg (ref 26.0–34.0)
MCHC: 29.9 g/dL — ABNORMAL LOW (ref 30.0–36.0)
MCV: 98.8 fL (ref 80.0–100.0)
Monocytes Absolute: 0.8 10*3/uL (ref 0.1–1.0)
Monocytes Relative: 8 %
Neutro Abs: 7 10*3/uL (ref 1.7–7.7)
Neutrophils Relative %: 71 %
Platelet Count: 264 10*3/uL (ref 150–400)
RBC: 2.47 MIL/uL — ABNORMAL LOW (ref 3.87–5.11)
RDW: 18.6 % — ABNORMAL HIGH (ref 11.5–15.5)
WBC Count: 9.8 10*3/uL (ref 4.0–10.5)
nRBC: 0.2 % (ref 0.0–0.2)

## 2023-07-16 LAB — SAMPLE TO BLOOD BANK

## 2023-07-16 LAB — CEA (IN HOUSE-CHCC): CEA (CHCC-In House): 917.87 ng/mL — ABNORMAL HIGH (ref 0.00–5.00)

## 2023-07-16 LAB — PREPARE RBC (CROSSMATCH)

## 2023-07-16 LAB — FERRITIN: Ferritin: 43 ng/mL (ref 11–307)

## 2023-07-16 MED ORDER — SODIUM CHLORIDE 0.9% IV SOLUTION
250.0000 mL | Freq: Once | INTRAVENOUS | Status: AC
  Administered 2023-07-16: 250 mL via INTRAVENOUS

## 2023-07-16 NOTE — Patient Instructions (Signed)

## 2023-07-17 LAB — TYPE AND SCREEN
ABO/RH(D): A POS
Antibody Screen: NEGATIVE
Unit division: 0
Unit division: 0

## 2023-07-17 LAB — BPAM RBC
Blood Product Expiration Date: 202410072359
Blood Product Expiration Date: 202410092359
ISSUE DATE / TIME: 202409121309
ISSUE DATE / TIME: 202409121309
Unit Type and Rh: 6200
Unit Type and Rh: 6200

## 2023-08-06 ENCOUNTER — Encounter: Payer: Self-pay | Admitting: Hematology

## 2023-08-06 ENCOUNTER — Inpatient Hospital Stay: Payer: 59

## 2023-08-06 ENCOUNTER — Inpatient Hospital Stay: Payer: 59 | Attending: Hematology | Admitting: Hematology

## 2023-08-06 ENCOUNTER — Other Ambulatory Visit: Payer: Self-pay

## 2023-08-06 VITALS — BP 107/59 | HR 71 | Temp 97.8°F | Resp 16 | Ht 63.0 in | Wt 189.3 lb

## 2023-08-06 DIAGNOSIS — C787 Secondary malignant neoplasm of liver and intrahepatic bile duct: Secondary | ICD-10-CM | POA: Insufficient documentation

## 2023-08-06 DIAGNOSIS — D649 Anemia, unspecified: Secondary | ICD-10-CM

## 2023-08-06 DIAGNOSIS — C189 Malignant neoplasm of colon, unspecified: Secondary | ICD-10-CM

## 2023-08-06 DIAGNOSIS — D500 Iron deficiency anemia secondary to blood loss (chronic): Secondary | ICD-10-CM

## 2023-08-06 DIAGNOSIS — N183 Chronic kidney disease, stage 3 unspecified: Secondary | ICD-10-CM | POA: Diagnosis not present

## 2023-08-06 DIAGNOSIS — R609 Edema, unspecified: Secondary | ICD-10-CM | POA: Diagnosis not present

## 2023-08-06 LAB — CBC WITH DIFFERENTIAL (CANCER CENTER ONLY)
Abs Immature Granulocytes: 0.31 10*3/uL — ABNORMAL HIGH (ref 0.00–0.07)
Basophils Absolute: 0.1 10*3/uL (ref 0.0–0.1)
Basophils Relative: 1 %
Eosinophils Absolute: 0.3 10*3/uL (ref 0.0–0.5)
Eosinophils Relative: 2 %
HCT: 25.3 % — ABNORMAL LOW (ref 36.0–46.0)
Hemoglobin: 7.8 g/dL — ABNORMAL LOW (ref 12.0–15.0)
Immature Granulocytes: 3 %
Lymphocytes Relative: 11 %
Lymphs Abs: 1.4 10*3/uL (ref 0.7–4.0)
MCH: 29.1 pg (ref 26.0–34.0)
MCHC: 30.8 g/dL (ref 30.0–36.0)
MCV: 94.4 fL (ref 80.0–100.0)
Monocytes Absolute: 0.9 10*3/uL (ref 0.1–1.0)
Monocytes Relative: 7 %
Neutro Abs: 9.5 10*3/uL — ABNORMAL HIGH (ref 1.7–7.7)
Neutrophils Relative %: 76 %
Platelet Count: 357 10*3/uL (ref 150–400)
RBC: 2.68 MIL/uL — ABNORMAL LOW (ref 3.87–5.11)
RDW: 18.7 % — ABNORMAL HIGH (ref 11.5–15.5)
WBC Count: 12.5 10*3/uL — ABNORMAL HIGH (ref 4.0–10.5)
nRBC: 0.7 % — ABNORMAL HIGH (ref 0.0–0.2)

## 2023-08-06 LAB — CMP (CANCER CENTER ONLY)
ALT: 14 U/L (ref 0–44)
AST: 21 U/L (ref 15–41)
Albumin: 3.2 g/dL — ABNORMAL LOW (ref 3.5–5.0)
Alkaline Phosphatase: 296 U/L — ABNORMAL HIGH (ref 38–126)
Anion gap: 8 (ref 5–15)
BUN: 28 mg/dL — ABNORMAL HIGH (ref 8–23)
CO2: 20 mmol/L — ABNORMAL LOW (ref 22–32)
Calcium: 9 mg/dL (ref 8.9–10.3)
Chloride: 107 mmol/L (ref 98–111)
Creatinine: 1.82 mg/dL — ABNORMAL HIGH (ref 0.44–1.00)
GFR, Estimated: 30 mL/min — ABNORMAL LOW (ref >60)
Glucose, Bld: 97 mg/dL (ref 70–99)
Potassium: 4.5 mmol/L (ref 3.5–5.1)
Sodium: 135 mmol/L (ref 135–145)
Total Bilirubin: 0.5 mg/dL (ref 0.3–1.2)
Total Protein: 7.1 g/dL (ref 6.5–8.1)

## 2023-08-06 LAB — PREPARE RBC (CROSSMATCH)

## 2023-08-06 LAB — FERRITIN: Ferritin: 73 ng/mL (ref 11–307)

## 2023-08-06 LAB — SAMPLE TO BLOOD BANK

## 2023-08-06 MED ORDER — SODIUM CHLORIDE 0.9% IV SOLUTION
250.0000 mL | Freq: Once | INTRAVENOUS | Status: AC
  Administered 2023-08-06: 250 mL via INTRAVENOUS
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
## 2023-08-06 NOTE — Patient Instructions (Signed)
Blood Transfusion, Adult A blood transfusion is a procedure in which you receive blood through an IV tube. You may need this procedure because of: A bleeding disorder. An illness. An injury. A surgery. The blood may come from someone else (a donor). You may also be able to donate blood for yourself before a surgery. The blood given in a transfusion may be made up of different types of cells. You may get: Red blood cells. These carry oxygen to the cells in the body. Platelets. These help your blood to clot. Plasma. This is the liquid part of your blood. It carries proteins and other substances through the body. White blood cells. These help you fight infections. If you have a clotting disorder, you may also get other types of blood products. Depending on the type of blood product, this procedure may take 1-4 hours to complete. Tell your doctor about: Any bleeding problems you have. Any reactions you have had during a blood transfusion in the past. Any allergies you have. All medicines you are taking, including vitamins, herbs, eye drops, creams, and over-the-counter medicines. Any surgeries you have had. Any medical conditions you have. Whether you are pregnant or may be pregnant. What are the risks? Talk with your health care provider about risks. The most common problems include: A mild allergic reaction. This includes red, swollen areas of skin (hives) and itching. Fever or chills. This may be the body's response to new blood cells received. This may happen during or up to 4 hours after the transfusion. More serious problems may include: A serious allergic reaction. This includes breathing trouble or swelling around the face and lips. Too much fluid in the lungs. This may cause breathing problems. Lung injury. This causes breathing trouble and low oxygen in the blood. This can happen within hours of the transfusion or days later. Too much iron. This can happen after getting many blood  transfusions over a period of time. An infection or virus passed through the blood. This is rare. Donated blood is carefully tested before it is given. Your body's defense system (immune system) trying to attack the new blood cells. This is rare. Symptoms may include fever, chills, nausea, low blood pressure, and low back or chest pain. Donated cells attacking healthy tissues. This is rare. What happens before the procedure? You will have a blood test to find out your blood type. The test also finds out what type of blood your body will accept and matches it to the donor type. If you are going to have a planned surgery, you may be able to donate your own blood. This may be done in case you need a transfusion. You will have your temperature, blood pressure, and pulse checked. You may receive medicine to help prevent an allergic reaction. This may be done if you have had a reaction to a transfusion before. This medicine may be given to you by mouth or through an IV tube. What happens during the procedure?  An IV tube will be put into one of your veins. The bag of blood will be attached to your IV tube. Then, the blood will enter through your vein. Your temperature, blood pressure, and pulse will be checked often. This is done to find early signs of a transfusion reaction. Tell your nurse right away if you have any of these symptoms: Shortness of breath or trouble breathing. Chest or back pain. Fever or chills. Red, swollen areas of skin or itching. If you have any signs   or symptoms of a reaction, your transfusion will be stopped. You may also be given medicine. When the transfusion is finished, your IV tube will be taken out. Pressure may be put on the IV site for a few minutes. A bandage (dressing) will be put on the IV site. The procedure may vary among doctors and hospitals. What happens after the procedure? You will be monitored until you leave the hospital or clinic. This includes  checking your temperature, blood pressure, pulse, breathing rate, and blood oxygen level. Your blood may be tested to see how you have responded to the transfusion. You may be warmed with fluids or blankets. This is done to keep the temperature of your body normal. If you have your procedure in an outpatient setting, you will be told whom to contact to report any reactions. Where to find more information Visit the [employer]: redcross.org Summary A blood transfusion is a procedure in which you receive blood through an IV tube. The blood you are given may be made up of different blood cells. You may receive red blood cells, platelets, plasma, or white blood cells. Your temperature, blood pressure, and pulse will be checked often. After the procedure, your blood may be tested to see how you have responded. This information is not intended to replace advice given to you by your health care provider. Make sure you discuss any questions you have with your health care provider. Document Revised: 01/17/2022 Document Reviewed: 01/17/2022 Elsevier Patient Education  2024 [employer]  

## 2023-08-07 ENCOUNTER — Telehealth: Payer: Self-pay | Admitting: Hematology

## 2023-08-07 LAB — BPAM RBC
Blood Product Expiration Date: 202411042359
ISSUE DATE / TIME: 202410031420
Unit Type and Rh: 6200

## 2023-08-07 LAB — TYPE AND SCREEN
ABO/RH(D): A POS
Antibody Screen: NEGATIVE
Unit division: 0

## 2023-08-15 ENCOUNTER — Emergency Department (HOSPITAL_COMMUNITY)
Admission: EM | Admit: 2023-08-15 | Discharge: 2023-08-16 | Disposition: A | Discharge status: Home / Self Care | Payer: 59 | Attending: Emergency Medicine | Admitting: Emergency Medicine

## 2023-08-15 ENCOUNTER — Other Ambulatory Visit: Payer: Self-pay

## 2023-08-15 DIAGNOSIS — Z8673 Personal history of transient ischemic attack (TIA), and cerebral infarction without residual deficits: Secondary | ICD-10-CM | POA: Insufficient documentation

## 2023-08-15 DIAGNOSIS — Z85038 Personal history of other malignant neoplasm of large intestine: Secondary | ICD-10-CM | POA: Insufficient documentation

## 2023-08-15 DIAGNOSIS — D649 Anemia, unspecified: Secondary | ICD-10-CM | POA: Diagnosis not present

## 2023-08-15 DIAGNOSIS — N289 Disorder of kidney and ureter, unspecified: Secondary | ICD-10-CM

## 2023-08-15 DIAGNOSIS — K625 Hemorrhage of anus and rectum: Secondary | ICD-10-CM | POA: Insufficient documentation

## 2023-08-15 DIAGNOSIS — Z7901 Long term (current) use of anticoagulants: Secondary | ICD-10-CM | POA: Diagnosis not present

## 2023-08-15 LAB — COMPREHENSIVE METABOLIC PANEL
ALT: 27 U/L (ref 0–44)
AST: 36 U/L (ref 15–41)
Albumin: 2.5 g/dL — ABNORMAL LOW (ref 3.5–5.0)
Alkaline Phosphatase: 444 U/L — ABNORMAL HIGH (ref 38–126)
Anion gap: 11 (ref 5–15)
BUN: 30 mg/dL — ABNORMAL HIGH (ref 8–23)
CO2: 16 mmol/L — ABNORMAL LOW (ref 22–32)
Calcium: 8.4 mg/dL — ABNORMAL LOW (ref 8.9–10.3)
Chloride: 109 mmol/L (ref 98–111)
Creatinine, Ser: 2.01 mg/dL — ABNORMAL HIGH (ref 0.44–1.00)
GFR, Estimated: 26 mL/min — ABNORMAL LOW (ref >60)
Glucose, Bld: 113 mg/dL — ABNORMAL HIGH (ref 70–99)
Potassium: 4.2 mmol/L (ref 3.5–5.1)
Sodium: 136 mmol/L (ref 135–145)
Total Bilirubin: 0.6 mg/dL (ref 0.3–1.2)
Total Protein: 6.9 g/dL (ref 6.5–8.1)

## 2023-08-15 LAB — CBC
HCT: 22.9 % — ABNORMAL LOW (ref 36.0–46.0)
Hemoglobin: 6.8 g/dL — CL (ref 12.0–15.0)
MCH: 29.1 pg (ref 26.0–34.0)
MCHC: 29.7 g/dL — ABNORMAL LOW (ref 30.0–36.0)
MCV: 97.9 fL (ref 80.0–100.0)
Platelets: 334 10*3/uL (ref 150–400)
RBC: 2.34 MIL/uL — ABNORMAL LOW (ref 3.87–5.11)
RDW: 18.5 % — ABNORMAL HIGH (ref 11.5–15.5)
WBC: 14.2 10*3/uL — ABNORMAL HIGH (ref 4.0–10.5)
nRBC: 0.5 % — ABNORMAL HIGH (ref 0.0–0.2)

## 2023-08-15 LAB — POC OCCULT BLOOD, ED: Fecal Occult Blood: POSITIVE

## 2023-08-15 LAB — PREPARE RBC (CROSSMATCH)

## 2023-08-15 MED ORDER — SODIUM CHLORIDE 0.9 % IV BOLUS
500.0000 mL | Freq: Once | INTRAVENOUS | Status: AC
  Administered 2023-08-15: 500 mL via INTRAVENOUS

## 2023-08-15 MED ORDER — SODIUM CHLORIDE 0.9% IV SOLUTION: Freq: Once | INTRAVENOUS | Status: AC

## 2023-08-15 NOTE — ED Provider Notes (Signed)
[place] EMERGENCY DEPARTMENT AT [facility] Provider Note   CSN: [id number] Arrival date & time: 08/15/23  4098     History  Chief Complaint  Patient presents with   Rectal Bleeding    Carmel A Marquina is a 67 y.o. female history of metastatic colon cancer on palliative treatment, on Eliquis for previous TIA, here presenting with rectal bleeding.  Patient is chronically ill at baseline.  Patient had dark stools normally and had an episode of bright red blood per rectum.  Patient is weaker than usual and hard for her to even walk.  Per family, patient only wants palliative treatment and is DNR.  She is amenable to get transfusions but does not want further chemotherapy.  The history is provided by the patient.       Home Medications Prior to Admission medications   Medication Sig Start Date End Date Taking? Authorizing Provider  acetaminophen (TYLENOL) 325 MG tablet Take 650 mg by mouth every 6 (six) hours as needed (General Discomfort).    [provider]  acetaminophen (TYLENOL) 500 MG tablet Take 2 tablets (1,000 mg total) by mouth every 6 (six) hours. 12/18/21   Meuth, Brooke A, PA-C  allopurinol (ZYLOPRIM) 300 MG tablet Take 300 mg by mouth daily. 05/11/19   [provider]  amLODipine (NORVASC) 5 MG tablet Take 1 tablet (5 mg total) by mouth daily. 01/01/22   Lorin Glass, MD  docusate sodium (COLACE) 100 MG capsule Take 1 capsule (100 mg total) by mouth 2 (two) times daily. 12/18/21   Meuth, Brooke A, PA-C  ELIQUIS 5 MG TABS tablet Take 5 mg by mouth 2 (two) times daily. 04/16/22   [provider]  feeding supplement (ENSURE ENLIVE / ENSURE PLUS) LIQD Take 237 mLs by mouth 2 (two) times daily between meals. 12/18/21   Meuth, Brooke A, PA-C  gabapentin (NEURONTIN) 300 MG capsule Take 300 mg by mouth 3 (three) times daily. 11/26/21   [provider]  lovastatin (MEVACOR) 20 MG tablet Take 20 mg by mouth daily. 10/10/21   [provider]  metoprolol tartrate (LOPRESSOR) 25 MG tablet Take 25 mg by mouth 2 (two) times daily. 10/01/21   [provider]  Multiple Vitamins-Minerals (MULTIVITAMIN-MINERALS PO) Take 1 tablet by mouth daily.    [provider]  oxyCODONE (OXY IR/ROXICODONE) 5 MG immediate release tablet Take 1 tablet (5 mg total) by mouth every 6 (six) hours as needed for severe pain. 12/30/21   Hollice Espy, MD  oxyCODONE-acetaminophen (PERCOCET/ROXICET) 5-325 MG tablet Take 1 tablet by mouth 4 (four) times daily as needed. 04/16/22   [provider]  tamsulosin (FLOMAX) 0.4 MG CAPS capsule Take 0.4 mg by mouth daily. 10/11/22   [provider]  tiZANidine (ZANAFLEX) 2 MG tablet Take 1 tablet (2 mg total) by mouth every 8 (eight) hours as needed. 09/23/22   Loa Socks, NP  Vitamin D, Ergocalciferol, (DRISDOL) 1.25 MG (50000 UNIT) CAPS capsule Take 50,000 Units by mouth once a week. 12/19/22   [provider]      Allergies    Patient has no known allergies.    Review of Systems   Review of Systems  Neurological:  Positive for weakness.  All other systems reviewed and are negative.   Physical Exam Updated Vital Signs BP 124/74 (BP Location: Left Arm)   Pulse 85   Temp 97.7 F (36.5 C) (Oral)   Resp 18   Ht 5\' 1"  (1.549  m)   Wt 88.9 kg   SpO2 96%   BMI 37.03 kg/m  Physical Exam Vitals and nursing note reviewed.  Constitutional:      Comments: Pale and chronically ill  HENT:     Head: Normocephalic.     Nose: Nose normal.     Mouth/Throat:     Mouth: Mucous membranes are moist.  Eyes:     Comments: Conjunctiva is pale  Cardiovascular:     Rate and Rhythm: Normal rate and regular rhythm.     Pulses: Normal pulses.     Heart sounds: Normal heart sounds.  Pulmonary:     Effort: Pulmonary effort is normal.     Breath sounds: Normal breath sounds.  Abdominal:     General: Abdomen is flat.     Palpations: Abdomen is soft.   Genitourinary:    Comments: Rectal- bright red blood  Musculoskeletal:        General: Normal range of motion.     Cervical back: Normal range of motion and neck supple.  Skin:    General: Skin is warm.  Neurological:     General: No focal deficit present.     Mental Status: She is alert.  Psychiatric:        Mood and Affect: Mood normal.     ED Results / Procedures / Treatments   Labs (all labs ordered are listed, but only abnormal results are displayed) Labs Reviewed  COMPREHENSIVE METABOLIC PANEL - Abnormal; Notable for the following components:      Result Value   CO2 16 (*)    Glucose, Bld 113 (*)    BUN 30 (*)    Creatinine, Ser 2.01 (*)    Calcium 8.4 (*)    Albumin 2.5 (*)    Alkaline Phosphatase 444 (*)    GFR, Estimated 26 (*)    All other components within normal limits  CBC  POC OCCULT BLOOD, ED  TYPE AND SCREEN    EKG None  Radiology No results found.  Procedures Procedures    Medications Ordered in ED Medications  sodium chloride 0.9 % bolus 500 mL (has no administration in time range)    ED Course/ Medical Decision Making/ A&P                                 Medical Decision Making Natsumi A Abbasi is a 67 y.o. female here presenting with rectal bleeding.  Patient is on Eliquis and has a history of metastatic colon cancer.  Patient does not want to get any treatment for the colon cancer.  She is amenable to get transfusions.  Plan to get CBC and CMP and likely will need transfusion.  9:34 PM Hg is 6.8, baseline 7.8.  I will order 1 unit PRBC.  Patient wants to be reassessed after the unit as she wants to go home if possible.   11:20 PM Blood is infusing. Signed out to Dr. Preston Fleeting to reassess patient after blood transfusion   Problems Addressed: Rectal bleeding: acute illness or injury Symptomatic anemia: acute illness or injury  Amount and/or Complexity of Data Reviewed Labs: ordered. Decision-making details documented in ED  Course.  Risk Prescription drug management.    Final Clinical Impression(s) / ED Diagnoses Final diagnoses:  None    Rx / DC Orders ED Discharge Orders     None         Chaney Malling  Hsienta, MD 08/15/23 2320

## 2023-08-15 NOTE — ED Triage Notes (Signed)
Pt currently in palliative care s/t colon cancer. Has need required blood transfusions, last one oct 3rd d/t concerns for ongoing rectal bleeding. Today pt is weaker than normal and noticed one large blood BM. Take eliquis

## 2023-08-15 NOTE — ED Provider Notes (Signed)
Care assumed from Dr. Archer Asa, patient with known colon cancer and anticoagulated on apixaban comes in with rectal bleeding and drop in hemoglobin.  She is refusing all treatment except for blood and is getting a blood transfusion.  Will need to be evaluated if she is strong enough for discharge following blood transfusion.  Following transfusion, patient was ambulatory with a walker, which is her baseline.  I am discharging her but I have requested that she discuss with her oncologist whether the risks of anticoagulation or worth the benefit she is getting.   Dione Booze, MD 08/16/23 [phone]

## 2023-08-15 NOTE — Discharge Instructions (Signed)
Your blood count was low and I want you to hold Eliquis.  You need to follow-up with your oncologist this week to get a repeat CBC  Return to ER if you have uncontrolled bleeding, passing out, weakness.  Please discuss with your cancer doctor whether the benefit of taking the blood thinner is greater than the risk of ongoing bleeding.

## 2023-08-16 NOTE — ED Notes (Signed)
Pt was able to ambulate using a walker which is her baseline at home

## 2023-08-17 LAB — BPAM RBC
Blood Product Expiration Date: 202411092359
ISSUE DATE / TIME: 202410122156
Unit Type and Rh: 6200

## 2023-08-17 LAB — TYPE AND SCREEN
ABO/RH(D): A POS
Antibody Screen: NEGATIVE
Unit division: 0

## 2023-08-18 ENCOUNTER — Other Ambulatory Visit: Payer: Self-pay

## 2023-08-18 ENCOUNTER — Emergency Department (HOSPITAL_COMMUNITY)
Admission: EM | Admit: 2023-08-18 | Discharge: 2023-08-20 | Disposition: A | Discharge status: Skilled Nursing Facility | Payer: 59 | Attending: Emergency Medicine | Admitting: Emergency Medicine

## 2023-08-18 ENCOUNTER — Ambulatory Visit: Payer: 59 | Admitting: Podiatry

## 2023-08-18 ENCOUNTER — Encounter (HOSPITAL_COMMUNITY): Payer: Self-pay

## 2023-08-18 ENCOUNTER — Other Ambulatory Visit: Payer: 59

## 2023-08-18 DIAGNOSIS — Z7901 Long term (current) use of anticoagulants: Secondary | ICD-10-CM | POA: Insufficient documentation

## 2023-08-18 DIAGNOSIS — Z9181 History of falling: Secondary | ICD-10-CM | POA: Insufficient documentation

## 2023-08-18 DIAGNOSIS — C785 Secondary malignant neoplasm of large intestine and rectum: Secondary | ICD-10-CM | POA: Diagnosis not present

## 2023-08-18 DIAGNOSIS — R531 Weakness: Secondary | ICD-10-CM

## 2023-08-18 DIAGNOSIS — R2681 Unsteadiness on feet: Secondary | ICD-10-CM | POA: Insufficient documentation

## 2023-08-18 LAB — CBC WITH DIFFERENTIAL/PLATELET
Abs Immature Granulocytes: 0.18 10*3/uL — ABNORMAL HIGH (ref 0.00–0.07)
Basophils Absolute: 0.1 10*3/uL (ref 0.0–0.1)
Basophils Relative: 1 %
Eosinophils Absolute: 0.2 10*3/uL (ref 0.0–0.5)
Eosinophils Relative: 2 %
HCT: 25.5 % — ABNORMAL LOW (ref 36.0–46.0)
Hemoglobin: 7.9 g/dL — ABNORMAL LOW (ref 12.0–15.0)
Immature Granulocytes: 1 %
Lymphocytes Relative: 13 %
Lymphs Abs: 1.7 10*3/uL (ref 0.7–4.0)
MCH: 29.9 pg (ref 26.0–34.0)
MCHC: 31 g/dL (ref 30.0–36.0)
MCV: 96.6 fL (ref 80.0–100.0)
Monocytes Absolute: 1.3 10*3/uL — ABNORMAL HIGH (ref 0.1–1.0)
Monocytes Relative: 9 %
Neutro Abs: 10.2 10*3/uL — ABNORMAL HIGH (ref 1.7–7.7)
Neutrophils Relative %: 74 %
Platelets: 326 10*3/uL (ref 150–400)
RBC: 2.64 MIL/uL — ABNORMAL LOW (ref 3.87–5.11)
RDW: 18.7 % — ABNORMAL HIGH (ref 11.5–15.5)
WBC: 13.6 10*3/uL — ABNORMAL HIGH (ref 4.0–10.5)
nRBC: 0.3 % — ABNORMAL HIGH (ref 0.0–0.2)

## 2023-08-18 LAB — COMPREHENSIVE METABOLIC PANEL
ALT: 34 U/L (ref 0–44)
AST: 44 U/L — ABNORMAL HIGH (ref 15–41)
Albumin: 2.5 g/dL — ABNORMAL LOW (ref 3.5–5.0)
Alkaline Phosphatase: 512 U/L — ABNORMAL HIGH (ref 38–126)
Anion gap: 11 (ref 5–15)
BUN: 26 mg/dL — ABNORMAL HIGH (ref 8–23)
CO2: 17 mmol/L — ABNORMAL LOW (ref 22–32)
Calcium: 8.6 mg/dL — ABNORMAL LOW (ref 8.9–10.3)
Chloride: 110 mmol/L (ref 98–111)
Creatinine, Ser: 1.72 mg/dL — ABNORMAL HIGH (ref 0.44–1.00)
GFR, Estimated: 32 mL/min — ABNORMAL LOW (ref >60)
Glucose, Bld: 127 mg/dL — ABNORMAL HIGH (ref 70–99)
Potassium: 3.9 mmol/L (ref 3.5–5.1)
Sodium: 138 mmol/L (ref 135–145)
Total Bilirubin: 1 mg/dL (ref 0.3–1.2)
Total Protein: 7 g/dL (ref 6.5–8.1)

## 2023-08-18 LAB — TYPE AND SCREEN
ABO/RH(D): A POS
Antibody Screen: NEGATIVE

## 2023-08-18 MED ORDER — METOPROLOL TARTRATE 25 MG PO TABS
25.0000 mg | ORAL_TABLET | Freq: Two times a day (BID) | ORAL | Status: DC
  Administered 2023-08-18 – 2023-08-20 (×4): 25 mg via ORAL
  Filled 2023-08-18 (×4): qty 1

## 2023-08-18 MED ORDER — ACETAMINOPHEN 500 MG PO TABS
1000.0000 mg | ORAL_TABLET | Freq: Four times a day (QID) | ORAL | Status: DC
  Administered 2023-08-18 – 2023-08-20 (×6): 1000 mg via ORAL
  Filled 2023-08-18 (×6): qty 2

## 2023-08-18 MED ORDER — GABAPENTIN 300 MG PO CAPS
300.0000 mg | ORAL_CAPSULE | Freq: Three times a day (TID) | ORAL | Status: DC
  Administered 2023-08-18 – 2023-08-20 (×5): 300 mg via ORAL
  Filled 2023-08-18 (×5): qty 1

## 2023-08-18 MED ORDER — OXYCODONE-ACETAMINOPHEN 5-325 MG PO TABS
1.0000 | ORAL_TABLET | Freq: Four times a day (QID) | ORAL | Status: DC | PRN
  Administered 2023-08-19 (×2): 1 via ORAL
  Filled 2023-08-18 (×2): qty 1

## 2023-08-18 MED ORDER — ACETAMINOPHEN 325 MG PO TABS: 650.0000 mg | ORAL_TABLET | Freq: Once | ORAL | Status: DC

## 2023-08-18 MED ORDER — AMLODIPINE BESYLATE 5 MG PO TABS
5.0000 mg | ORAL_TABLET | Freq: Every day | ORAL | Status: DC
  Administered 2023-08-19 – 2023-08-20 (×2): 5 mg via ORAL
  Filled 2023-08-18 (×2): qty 1

## 2023-08-18 MED ORDER — ALLOPURINOL 300 MG PO TABS
300.0000 mg | ORAL_TABLET | Freq: Every day | ORAL | Status: DC
  Administered 2023-08-19 – 2023-08-20 (×2): 300 mg via ORAL
  Filled 2023-08-18 (×2): qty 1

## 2023-08-18 MED ORDER — APIXABAN 5 MG PO TABS: 5.0000 mg | ORAL_TABLET | Freq: Two times a day (BID) | ORAL | Status: DC

## 2023-08-18 MED ORDER — TAMSULOSIN HCL 0.4 MG PO CAPS: 0.4000 mg | ORAL_CAPSULE | Freq: Every day | ORAL | Status: DC

## 2023-08-18 MED ORDER — SODIUM CHLORIDE 0.9 % IV BOLUS
1000.0000 mL | Freq: Once | INTRAVENOUS | Status: AC
  Administered 2023-08-18: 1000 mL via INTRAVENOUS

## 2023-08-18 MED ORDER — TIZANIDINE HCL 4 MG PO TABS: 2.0000 mg | ORAL_TABLET | Freq: Three times a day (TID) | ORAL | Status: DC | PRN

## 2023-08-18 MED ORDER — DOCUSATE SODIUM 100 MG PO CAPS
100.0000 mg | ORAL_CAPSULE | Freq: Two times a day (BID) | ORAL | Status: DC
  Administered 2023-08-18 – 2023-08-20 (×4): 100 mg via ORAL
  Filled 2023-08-18 (×4): qty 1

## 2023-08-18 NOTE — ED Provider Notes (Signed)
[place] EMERGENCY DEPARTMENT AT [facility] Provider Note   CSN: [id number] Arrival date & time: 08/18/23  1704     History  Chief Complaint  Patient presents with   Weakness   Fatigue    [name] is a 67 y.o. female history of metastatic colon cancer, here presenting with weakness and fatigue.  Patient was seen here several days ago and had labs that showed that she was anemic.  She received 1 unit PRBC and was discharged home.  Patient felt slightly better and then felt very weak.  Patient has recurrent falls.  Patient states that she has been in bed and felt very unsteady.  Patient denies hitting her head.  Per our last discussion during the ED visit, patient does not want CT to assess the status of her colon cancer.  She is DNR.  Per patient and daughter at bedside, they just want her comfortable.  Unfortunately she requires too much care at home.  They are not interested in home health since she is staying with her sister and her sister does not want anyone in her home.  They are interested in nursing home placement.  They called the social worker and they recommend that patient be sent to the ER for social work and placement.  Patient denies any further rectal bleeding.  The history is provided by the patient.       Home Medications Prior to Admission medications   Medication Sig Start Date End Date Taking? Authorizing Provider  acetaminophen (TYLENOL) 325 MG tablet Take 650 mg by mouth every 6 (six) hours as needed (General Discomfort).    [provider]  acetaminophen (TYLENOL) 500 MG tablet Take 2 tablets (1,000 mg total) by mouth every 6 (six) hours. 12/18/21   Meuth, Brooke A, PA-C  allopurinol (ZYLOPRIM) 300 MG tablet Take 300 mg by mouth daily. 05/11/19   [provider]  amLODipine (NORVASC) 5 MG tablet Take 1 tablet (5 mg total) by mouth daily. 01/01/22   Lorin Glass, MD  docusate sodium (COLACE) 100 MG capsule Take 1 capsule (100 mg  total) by mouth 2 (two) times daily. 12/18/21   Meuth, Brooke A, PA-C  ELIQUIS 5 MG TABS tablet Take 5 mg by mouth 2 (two) times daily. 04/16/22   [provider]  feeding supplement (ENSURE ENLIVE / ENSURE PLUS) LIQD Take 237 mLs by mouth 2 (two) times daily between meals. 12/18/21   Meuth, Brooke A, PA-C  gabapentin (NEURONTIN) 300 MG capsule Take 300 mg by mouth 3 (three) times daily. 11/26/21   [provider]  lovastatin (MEVACOR) 20 MG tablet Take 20 mg by mouth daily. 10/10/21   [provider]  metoprolol tartrate (LOPRESSOR) 25 MG tablet Take 25 mg by mouth 2 (two) times daily. 10/01/21   [provider]  Multiple Vitamins-Minerals (MULTIVITAMIN-MINERALS PO) Take 1 tablet by mouth daily.    [provider]  oxyCODONE (OXY IR/ROXICODONE) 5 MG immediate release tablet Take 1 tablet (5 mg total) by mouth every 6 (six) hours as needed for severe pain. 12/30/21   Hollice Espy, MD  oxyCODONE-acetaminophen (PERCOCET/ROXICET) 5-325 MG tablet Take 1 tablet by mouth 4 (four) times daily as needed. 04/16/22   [provider]  tamsulosin (FLOMAX) 0.4 MG CAPS capsule Take 0.4 mg by mouth daily. 10/11/22   [provider]  tiZANidine (ZANAFLEX) 2 MG tablet Take 1 tablet (2 mg total) by mouth every 8 (eight) hours as needed. 09/23/22  Loa Socks, NP  Vitamin D, Ergocalciferol, (DRISDOL) 1.25 MG (50000 UNIT) CAPS capsule Take 50,000 Units by mouth once a week. 12/19/22   [provider]      Allergies    Patient has no known allergies.    Review of Systems   Review of Systems  Neurological:  Positive for weakness.  All other systems reviewed and are negative.   Physical Exam Updated Vital Signs BP 125/80   Pulse 98   Temp 98.8 F (37.1 C) (Oral)   Resp 16   Ht 5\' 1"  (1.549 m)   Wt 88 kg   SpO2 95%   BMI 36.66 kg/m  Physical Exam Vitals and nursing note reviewed.  Constitutional:      Comments:  Chronically ill-appearing  HENT:     Head: Normocephalic.     Nose: Nose normal.     Mouth/Throat:     Mouth: Mucous membranes are dry.  Eyes:     Extraocular Movements: Extraocular movements intact.     Pupils: Pupils are equal, round, and reactive to light.  Cardiovascular:     Rate and Rhythm: Normal rate and regular rhythm.     Pulses: Normal pulses.     Heart sounds: Normal heart sounds.  Pulmonary:     Effort: Pulmonary effort is normal.     Breath sounds: Normal breath sounds.  Abdominal:     General: Abdomen is flat.     Palpations: Abdomen is soft.  Musculoskeletal:        General: Normal range of motion.     Cervical back: Normal range of motion and neck supple.     Comments: No obvious deformities or signs of trauma.  No spinal tenderness  Skin:    General: Skin is warm.     Capillary Refill: Capillary refill takes less than 2 seconds.  Neurological:     Mental Status: She is alert.     Comments: Strength is 3 out of 5 bilateral arms and legs.  Patient is unable to sit up by herself.  Psychiatric:        Mood and Affect: Mood normal.        Behavior: Behavior normal.     ED Results / Procedures / Treatments   Labs (all labs ordered are listed, but only abnormal results are displayed) Labs Reviewed  CBC WITH DIFFERENTIAL/PLATELET - Abnormal; Notable for the following components:      Result Value   WBC 13.6 (*)    RBC 2.64 (*)    Hemoglobin 7.9 (*)    HCT 25.5 (*)    RDW 18.7 (*)    nRBC 0.3 (*)    Neutro Abs 10.2 (*)    Monocytes Absolute 1.3 (*)    Abs Immature Granulocytes 0.18 (*)    All other components within normal limits  COMPREHENSIVE METABOLIC PANEL  TYPE AND SCREEN    EKG None  Radiology No results found.  Procedures Procedures    Medications Ordered in ED Medications  sodium chloride 0.9 % bolus 1,000 mL (1,000 mLs Intravenous Bolus 08/18/23 2123)    ED Course/ Medical Decision Making/ A&P                                  Medical Decision Making [name] is a 67 y.o. female here presenting with weakness.  Patient was recently seen here for symptomatic anemia.  Patient had  no further bleeding.  I discussed CODE STATUS with patient again.  She is DNR.  She does not want to get any imaging to see if the cancer has progressed.  She is only interested to be comfortable and wants nursing home placement.  I told her that we will recheck her hemoglobin make sure her hemoglobin is stable.  Will also consult social work and physical therapy.   11:43 PM Reviewed patient's labs and hemoglobin is 7.9 which is improved from about a week ago.  Creatinine is baseline.  At this point, patient does not meet admission criteria.  Will continue to seek placement and I have consulted social work as well as physical therapy.  Problems Addressed: Carcinoma metastatic to colon [facility]): chronic illness or injury Weakness: chronic illness or injury  Amount and/or Complexity of Data Reviewed Labs: ordered. Decision-making details documented in ED Course.  Risk OTC drugs. Prescription drug management.    Final Clinical Impression(s) / ED Diagnoses Final diagnoses:  None    Rx / DC Orders ED Discharge Orders     None         Charlynne Pander, MD 08/18/23 2345

## 2023-08-18 NOTE — ED Triage Notes (Signed)
Pt arrived with sister who reports she is under palliative care, has Ca. States they need to speak with social work to get help with placement. Reports patient cannot take care of herself. Had been getting progressively weak. Was advised to come over here from the [facility] center today. Recently came in and received 1 unit of blood. Denies any known bleeding at this time, stool is not really dark

## 2023-08-18 NOTE — ED Notes (Signed)
Pt has urine sample at bedside if needed.

## 2023-08-18 NOTE — Progress Notes (Signed)
Transition of Care [facility]) - Emergency Department Mini Assessment   Patient Details  Name: [name] MRN: [id number] Date of Birth: 03/14/1956  Transition of Care [facility]) CM/SW Contact:    Brittney Chard, LCSW Phone Number: 08/18/2023, 9:56 PM   Clinical Narrative: CSW spoke to the patient's son [name] at this time the son states that the mom lives at home with the sister.CSW has inquired about HH however son stated that the patient's sister does not want any one in her house. The son is looking for LTC. The patient's son continued to say that she can not live on her own. At this time PT is consulted to see the patient. This CSW has advised that son that if the patient does not have a skillable need would need to return home with the sister. CSW also explained LTC. Patient stated he has no other plan put in place for the mom and will go to DSS tomorrow to find out about special assistance medicaid. TOC will continue to follow.    ED Mini Assessment:    Barriers to Discharge: (P) No Barriers Identified        Interventions which prevented an admission or readmission: (P) SNF Placement    Patient Contact and Communications     Spoke with: (P) Patient's son  IT trainer Date: (P) 08/18/23,     Contact Phone Number: (P) [phone]    Patient states their goals for this hospitalization and ongoing recovery are:: (P) SOn states that mom lives with sister and is by her self      Admission diagnosis:  CA pt, difficulty standing weakness, needs blood, SHoB,cough Patient Active Problem List   Diagnosis Date Noted   DNR (do not resuscitate) 04/01/2023   Blood clotting disorder (HCC) 07/23/2022   Iron deficiency anemia due to chronic blood loss 07/18/2022   Obesity (BMI 30-39.9) 12/28/2021   AKI (acute kidney injury) (HCC) 12/27/2021   Hypotension 12/27/2021   UTI (urinary tract infection) 12/27/2021   Chronic anemia 12/27/2021   Metastatic colon cancer to liver (HCC)  12/27/2021   Leukocytosis 12/27/2021   Closed C2 fracture (HCC) 12/27/2021   History of nephrectomy, left 12/27/2021   Multisystem blunt trauma 12/04/2021   Physical deconditioning 05/20/2019   Oxygen dependent    Sepsis without acute organ dysfunction (HCC)    E coli bacteremia 05/15/2019   Shock liver 05/15/2019   Septic shock (HCC) 05/15/2019   Gallstone pancreatitis 05/12/2019   Renal mass 07/18/2015   PCP:  [name], MD Pharmacy:   [facility] [phone] - [place], [place] - [address] [place] [address] Phone: [phone] Fax: [phone]

## 2023-08-19 ENCOUNTER — Other Ambulatory Visit: Payer: Self-pay

## 2023-08-19 DIAGNOSIS — C785 Secondary malignant neoplasm of large intestine and rectum: Secondary | ICD-10-CM | POA: Diagnosis not present

## 2023-08-19 NOTE — ED Notes (Signed)
Assumed care of patient. Patient currently sleeping in bed with no signs of acute distress noted. Waiting on PT/OT evaluations and possible placement in assisted living.

## 2023-08-19 NOTE — ED Provider Notes (Signed)
The patient's med rec was reviewed this morning and I was informed that the patient was taken off of Eliquis yesterday by her primary care provider as well as tizanidine.  These medications were discontinued for this reason.   Durwin Glaze, MD 08/19/23 [phone]

## 2023-08-19 NOTE — ED Notes (Addendum)
1:14 AM  Report received from previous RN. This RN assumes care of the patient.   1:31 AM  Patient laying comfortable in stretcher with equal rise and fall of the chest wall. Patient in NAD. Provided ice water per request. Patient assisted with repositioning in stretcher. She denies any further needs at this time. Call light in reach. Bed in lowest position. Plan of care ongoing.   2:21 AM  Patient sleeping with even respirations. Patient in NAD. No needs at this time. Call light in reach. Bed in lowest position. Plan of care ongoing.   3:20 AM  Patient assisted to the restroom.   4:33 AM  Patient watching tv with even respirations. She denies any needs at this time. Call light in reach. Bed in lowest position. Plan of care ongoing.   5:12 AM  Patient medicated per order.

## 2023-08-19 NOTE — Evaluation (Signed)
Physical Therapy Evaluation Patient Details Name: [name] MRN: [id number] DOB: 1956-03-11 Today's Date: 08/19/2023  History of Present Illness  [name] is a 67 y.o. female history of metastatic colon cancer, here presenting with weakness and fatigue.  Patient was seen here several days ago and had labs that showed that she was anemic.  She received 1 unit PRBC and was discharged home. Returns to Ed 10/15 due to weakness .  Clinical Impression  Pt admitted with above diagnosis.  Pt currently with functional limitations due to the deficits listed below (see PT Problem List). Pt will benefit from acute skilled PT to increase their independence and safety with mobility to allow discharge.       The patient ambulated x 50' x 2 with RW and CGA. Patient reports that  she has falls when weak, low HGB.  Patient reports sister is unable to provide care as other family member is in need.  Patient currently is CGA  for  ambulation.     If plan is discharge home, recommend the following: A little help with walking and/or transfers;A little help with bathing/dressing/bathroom;Help with stairs or ramp for entrance;Assistance with cooking/housework;Assist for transportation   Can travel by private vehicle        Equipment Recommendations None recommended by PT  Recommendations for Other Services       Functional Status Assessment Patient has not had a recent decline in their functional status     Precautions / Restrictions Precautions Precautions: Fall Restrictions Weight Bearing Restrictions: No      Mobility  Bed Mobility Overal bed mobility: Needs Assistance Bed Mobility: Supine to Sit, Sit to Supine     Supine to sit: Contact guard Sit to supine: Contact guard assist   General bed mobility comments: patient managed to mobilize with no assistance    Transfers Overall transfer level: Needs assistance Equipment used: Rolling walker (2 wheels) Transfers: Sit to/from  Stand Sit to Stand: Contact guard assist, From elevated surface           General transfer comment: , from high stretcher,  CGA, from lower toilet min  with rail    Ambulation/Gait Ambulation/Gait assistance: Contact guard assist Gait Distance (Feet): 50 Feet (x 2) Assistive device: Rolling walker (2 wheels) Gait Pattern/deviations: Step-through pattern Gait velocity: decr     General Gait Details: gait steady, turns  around in BR, steady with Rw  Stairs            Wheelchair Mobility     Tilt Bed    Modified Rankin (Stroke Patients Only)       Balance Overall balance assessment: History of Falls, Needs assistance Sitting-balance support: No upper extremity supported Sitting balance-Leahy Scale: Fair     Standing balance support: During functional activity, Single extremity supported Standing balance-Leahy Scale: Fair Standing balance comment: to pull up briefs                             Pertinent Vitals/Pain Pain Assessment Pain Assessment: No/denies pain    Home Living Family/patient expects to be discharged to:: Private residence Living Arrangements: Other relatives Available Help at Discharge: Family;Available PRN/intermittently Type of Home: House         Home Layout: One level Home Equipment: Rollator (4 wheels)      Prior Function               Mobility Comments: ambulates with rollator,  has been able to mobilize , does fall backwards ADLs Comments: assist with bath     Extremity/Trunk Assessment   Upper Extremity Assessment Upper Extremity Assessment: Overall WFL for tasks assessed    Lower Extremity Assessment Lower Extremity Assessment: Overall WFL for tasks assessed    Cervical / Trunk Assessment Cervical / Trunk Assessment: Normal  Communication   Communication Communication: No apparent difficulties  Cognition Arousal: Alert Behavior During Therapy: WFL for tasks assessed/performed Overall Cognitive  Status: Within Functional Limits for tasks assessed                                 General Comments: patient states that she does not have Palliative and does not know what it is(per chart, Palliative follows at home)        General Comments      Exercises     Assessment/Plan    PT Assessment Patient needs continued PT services  PT Problem List Decreased mobility;Decreased safety awareness;Decreased activity tolerance;Decreased balance;Decreased knowledge of use of DME       PT Treatment Interventions DME instruction;Therapeutic exercise;Gait training;Functional mobility training;Therapeutic activities;Patient/family education    PT Goals (Current goals can be found in the Care Plan section)  Acute Rehab PT Goals Patient Stated Goal: to go to a facility PT Goal Formulation: With patient Time For Goal Achievement: 09/02/23 Potential to Achieve Goals: Good    Frequency Min 1X/week     Co-evaluation               AM-PAC PT "6 Clicks" Mobility  Outcome Measure Help needed turning from your back to your side while in a flat bed without using bedrails?: None Help needed moving from lying on your back to sitting on the side of a flat bed without using bedrails?: None Help needed moving to and from a bed to a chair (including a wheelchair)?: None Help needed standing up from a chair using your arms (e.g., wheelchair or bedside chair)?: A Little Help needed to walk in hospital room?: A Little Help needed climbing 3-5 steps with a railing? : A Lot 6 Click Score: 20    End of Session Equipment Utilized During Treatment: Gait belt Activity Tolerance: Patient tolerated treatment well Patient left: in bed;with call bell/phone within reach Nurse Communication: Mobility status PT Visit Diagnosis: Unsteadiness on feet (R26.81);History of falling (Z91.81)    Time: 1610-9604 PT Time Calculation (min) (ACUTE ONLY): 28 min   Charges:   PT Evaluation $PT Eval  Low Complexity: 1 Low PT Treatments $Gait Training: 8-22 mins PT General Charges $$ ACUTE PT VISIT: 1 Visit         Blanchard Kelch PT Acute Rehabilitation Services Office [phone] Weekend pager-[phone]   Rada Hay 08/19/2023, 10:16 AM

## 2023-08-19 NOTE — ED Notes (Signed)
Patient requesting pad and underwear change. Changed patient up. Tolerated well.

## 2023-08-19 NOTE — [place] FL2 (Signed)
[place] MEDICAID FL2 LEVEL OF CARE FORM     IDENTIFICATION  Patient Name: [name] Birthdate: April 20, 1956 Sex: female Admission Date (Current Location): 08/18/2023  [facility] and [place] Number:  Producer, television/film/video and Address:  [facility],  [address] [place], [place] [address]      Provider Number: [id number]  Attending Physician Name and Address:  System, Provider Not In  Relative Name and Phone Number:  Reyhana Bujak (son)- [phone]    Current Level of Care: Hospital Recommended Level of Care: Skilled Nursing Facility Prior Approval Number:    Date Approved/Denied:   PASRR Number: [id number]  Discharge Plan: SNF    Current Diagnoses: Patient Active Problem List   Diagnosis Date Noted   DNR (do not resuscitate) 04/01/2023   Blood clotting disorder (HCC) 07/23/2022   Iron deficiency anemia due to chronic blood loss 07/18/2022   Obesity (BMI 30-39.9) 12/28/2021   AKI (acute kidney injury) (HCC) 12/27/2021   Hypotension 12/27/2021   UTI (urinary tract infection) 12/27/2021   Chronic anemia 12/27/2021   Metastatic colon cancer to liver (HCC) 12/27/2021   Leukocytosis 12/27/2021   Closed C2 fracture (HCC) 12/27/2021   History of nephrectomy, left 12/27/2021   Multisystem blunt trauma 12/04/2021   Physical deconditioning 05/20/2019   Oxygen dependent    Sepsis without acute organ dysfunction (HCC)    E coli bacteremia 05/15/2019   Shock liver 05/15/2019   Septic shock (HCC) 05/15/2019   Gallstone pancreatitis 05/12/2019   Renal mass 07/18/2015    Orientation RESPIRATION BLADDER Height & Weight     Self, Time, Situation, Place  Normal Continent Weight: 194 lb 0.1 oz (88 kg) Height:  5\' 1"  (154.9 cm)  BEHAVIORAL SYMPTOMS/MOOD NEUROLOGICAL BOWEL NUTRITION STATUS      Continent Diet (Regular)  AMBULATORY STATUS COMMUNICATION OF NEEDS Skin   Independent Verbally Normal                       Personal Care Assistance Level of  Assistance  Bathing, Feeding, Dressing Bathing Assistance: Limited assistance Feeding assistance: Independent Dressing Assistance: Limited assistance     Functional Limitations Info  Sight, Hearing, Speech Sight Info: Adequate Hearing Info: Adequate Speech Info: Adequate    SPECIAL CARE FACTORS FREQUENCY  PT (By licensed PT), OT (By licensed OT)     PT Frequency: x5/week OT Frequency: x5/week            Contractures Contractures Info: Not present    Additional Factors Info  Code Status, Allergies Code Status Info: DNR-Comfort Allergies Info: No Known Allergies           Current Medications (08/19/2023):  This is the current hospital active medication list Current Facility-Administered Medications  Medication Dose Route Frequency Provider Last Rate Last Admin   acetaminophen (TYLENOL) tablet 1,000 mg  1,000 mg Oral Q6H Charlynne Pander, MD   1,000 mg at 08/19/23 1100   acetaminophen (TYLENOL) tablet 650 mg  650 mg Oral Once Charlynne Pander, MD       allopurinol (ZYLOPRIM) tablet 300 mg  300 mg Oral Daily Charlynne Pander, MD   300 mg at 08/19/23 0950   amLODipine (NORVASC) tablet 5 mg  5 mg Oral Daily Charlynne Pander, MD   5 mg at 08/19/23 0950   docusate sodium (COLACE) capsule 100 mg  100 mg Oral BID Charlynne Pander, MD   100 mg at 08/19/23 0950   gabapentin (NEURONTIN) capsule 300  mg  300 mg Oral TID Charlynne Pander, MD   300 mg at 08/19/23 0950   metoprolol tartrate (LOPRESSOR) tablet 25 mg  25 mg Oral BID Charlynne Pander, MD   25 mg at 08/19/23 1610   oxyCODONE-acetaminophen (PERCOCET/ROXICET) 5-325 MG per tablet 1 tablet  1 tablet Oral QID PRN Charlynne Pander, MD   1 tablet at 08/19/23 9604   Current Outpatient Medications  Medication Sig Dispense Refill   acetaminophen (TYLENOL) 325 MG tablet Take 650 mg by mouth every 6 (six) hours as needed (General Discomfort).     allopurinol (ZYLOPRIM) 300 MG tablet Take 300 mg by mouth daily.      amLODipine (NORVASC) 5 MG tablet Take 1 tablet (5 mg total) by mouth daily.     docusate sodium (COLACE) 100 MG capsule Take 1 capsule (100 mg total) by mouth 2 (two) times daily. 10 capsule 0   gabapentin (NEURONTIN) 300 MG capsule Take 300 mg by mouth 3 (three) times daily.     lovastatin (MEVACOR) 20 MG tablet Take 20 mg by mouth daily.     metoprolol tartrate (LOPRESSOR) 25 MG tablet Take 25 mg by mouth 2 (two) times daily.     Multiple Vitamins-Minerals (MULTIVITAMIN-MINERALS PO) Take 1 tablet by mouth daily.     Vitamin D, Ergocalciferol, (DRISDOL) 1.25 MG (50000 UNIT) CAPS capsule Take 50,000 Units by mouth once a week.     acetaminophen (TYLENOL) 500 MG tablet Take 2 tablets (1,000 mg total) by mouth every 6 (six) hours. (Patient not taking: Reported on 08/19/2023) 30 tablet 0   ELIQUIS 5 MG TABS tablet Take 5 mg by mouth 2 (two) times daily. (Patient not taking: Reported on 08/19/2023)     oxyCODONE (OXY IR/ROXICODONE) 5 MG immediate release tablet Take 1 tablet (5 mg total) by mouth every 6 (six) hours as needed for severe pain. (Patient not taking: Reported on 08/19/2023) 5 tablet 0   oxyCODONE-acetaminophen (PERCOCET/ROXICET) 5-325 MG tablet Take 1 tablet by mouth 4 (four) times daily as needed. (Patient not taking: Reported on 08/19/2023)     tamsulosin (FLOMAX) 0.4 MG CAPS capsule Take 0.4 mg by mouth daily. (Patient not taking: Reported on 08/19/2023)       Discharge Medications: Please see discharge summary for a list of discharge medications.  Relevant Imaging Results:  Relevant Lab Results:   Additional Information SSN: [id number]  Princella Ion, LCSW

## 2023-08-19 NOTE — Progress Notes (Addendum)
Bed offers presented to son. Son will review and call CSW back.   Addend@ 4:00pm  AUTH pending.

## 2023-08-19 NOTE — Progress Notes (Signed)
Patient's AUTH was approved CSW has informed son as well as Janie at facility. TOC will continue to follow for DC.

## 2023-08-19 NOTE — Progress Notes (Signed)
CSW spoke with pt at bedside and pt is interested in rehab. Pt states she lives with her sister but reports her brother in law suffered an injury and her sister is caring for him. Pt's preferred SNF is [facility]. Pt faxed out. Bed offers pending.

## 2023-08-20 DIAGNOSIS — C785 Secondary malignant neoplasm of large intestine and rectum: Secondary | ICD-10-CM | POA: Diagnosis not present

## 2023-08-20 NOTE — Progress Notes (Signed)
Pt to discharge to [facility] today. [facility] to transport. Room won't be ready until 1:30 PM. Report info provided to Paramedic via secure chat.

## 2023-08-20 NOTE — ED Notes (Signed)
PT[facility] contacted and transportation arranged for 13:00 today

## 2023-08-20 NOTE — Discharge Instructions (Addendum)
Follow up with your oncologist as planned

## 2023-08-20 NOTE — ED Provider Notes (Signed)
Plan is for pt to go to SNF today.  Vitals stable.  No events overnight.  Pt with metastatic cancer, not interested in any treatment.  Has been seeing oncology.   Linwood Dibbles, MD 08/20/23 [phone]

## 2023-08-20 NOTE — ED Notes (Signed)
Assisted pt to the bathroom with her walker

## 2023-08-21 ENCOUNTER — Inpatient Hospital Stay: Payer: 59

## 2023-08-24 NOTE — Plan of Care (Signed)
[facility] Tonsillectomy/Adenoidectomy, Postoperative PEDS care plan entered in error.

## 2023-09-01 ENCOUNTER — Telehealth: Payer: Self-pay

## 2023-09-01 ENCOUNTER — Inpatient Hospital Stay: Payer: 59

## 2023-09-01 NOTE — Telephone Encounter (Signed)
Tried contacting primary telephone number on file for pt regarding missed appts for today.  LVM stating pt had appts today for lab & possible blood transfusion.  Instructed pt's sister Warden Fillers to please contact the [facility] Scheduling Team to have the appts rescheduled.

## 2023-09-08 ENCOUNTER — Other Ambulatory Visit: Payer: Self-pay

## 2023-09-08 ENCOUNTER — Inpatient Hospital Stay (HOSPITAL_COMMUNITY)
Admission: EM | Admit: 2023-09-08 | Discharge: 2023-09-14 | DRG: 871 | Disposition: A | Discharge status: Skilled Nursing Facility | Payer: 59 | Attending: Internal Medicine | Admitting: Internal Medicine

## 2023-09-08 ENCOUNTER — Observation Stay (HOSPITAL_COMMUNITY): Payer: 59

## 2023-09-08 DIAGNOSIS — I129 Hypertensive chronic kidney disease with stage 1 through stage 4 chronic kidney disease, or unspecified chronic kidney disease: Secondary | ICD-10-CM | POA: Diagnosis present

## 2023-09-08 DIAGNOSIS — Z1152 Encounter for screening for COVID-19: Secondary | ICD-10-CM

## 2023-09-08 DIAGNOSIS — C787 Secondary malignant neoplasm of liver and intrahepatic bile duct: Secondary | ICD-10-CM | POA: Diagnosis present

## 2023-09-08 DIAGNOSIS — G9341 Metabolic encephalopathy: Secondary | ICD-10-CM

## 2023-09-08 DIAGNOSIS — C7802 Secondary malignant neoplasm of left lung: Secondary | ICD-10-CM | POA: Diagnosis present

## 2023-09-08 DIAGNOSIS — Z515 Encounter for palliative care: Secondary | ICD-10-CM

## 2023-09-08 DIAGNOSIS — C7800 Secondary malignant neoplasm of unspecified lung: Principal | ICD-10-CM

## 2023-09-08 DIAGNOSIS — R4589 Other symptoms and signs involving emotional state: Secondary | ICD-10-CM

## 2023-09-08 DIAGNOSIS — C189 Malignant neoplasm of colon, unspecified: Principal | ICD-10-CM

## 2023-09-08 DIAGNOSIS — N179 Acute kidney failure, unspecified: Secondary | ICD-10-CM | POA: Diagnosis not present

## 2023-09-08 DIAGNOSIS — A4159 Other Gram-negative sepsis: Secondary | ICD-10-CM | POA: Diagnosis not present

## 2023-09-08 DIAGNOSIS — Z8249 Family history of ischemic heart disease and other diseases of the circulatory system: Secondary | ICD-10-CM

## 2023-09-08 DIAGNOSIS — Z6834 Body mass index (BMI) 34.0-34.9, adult: Secondary | ICD-10-CM

## 2023-09-08 DIAGNOSIS — N184 Chronic kidney disease, stage 4 (severe): Secondary | ICD-10-CM | POA: Diagnosis present

## 2023-09-08 DIAGNOSIS — E785 Hyperlipidemia, unspecified: Secondary | ICD-10-CM | POA: Diagnosis present

## 2023-09-08 DIAGNOSIS — Z66 Do not resuscitate: Secondary | ICD-10-CM | POA: Diagnosis present

## 2023-09-08 DIAGNOSIS — R6520 Severe sepsis without septic shock: Secondary | ICD-10-CM | POA: Diagnosis present

## 2023-09-08 DIAGNOSIS — I10 Essential (primary) hypertension: Secondary | ICD-10-CM

## 2023-09-08 DIAGNOSIS — R531 Weakness: Secondary | ICD-10-CM

## 2023-09-08 DIAGNOSIS — G629 Polyneuropathy, unspecified: Secondary | ICD-10-CM | POA: Diagnosis present

## 2023-09-08 DIAGNOSIS — C7801 Secondary malignant neoplasm of right lung: Secondary | ICD-10-CM | POA: Diagnosis present

## 2023-09-08 DIAGNOSIS — Z7189 Other specified counseling: Secondary | ICD-10-CM

## 2023-09-08 DIAGNOSIS — Z85528 Personal history of other malignant neoplasm of kidney: Secondary | ICD-10-CM

## 2023-09-08 DIAGNOSIS — J189 Pneumonia, unspecified organism: Secondary | ICD-10-CM | POA: Diagnosis present

## 2023-09-08 DIAGNOSIS — E871 Hypo-osmolality and hyponatremia: Secondary | ICD-10-CM | POA: Diagnosis not present

## 2023-09-08 DIAGNOSIS — Z83438 Family history of other disorder of lipoprotein metabolism and other lipidemia: Secondary | ICD-10-CM

## 2023-09-08 DIAGNOSIS — B961 Klebsiella pneumoniae [K. pneumoniae] as the cause of diseases classified elsewhere: Secondary | ICD-10-CM | POA: Diagnosis present

## 2023-09-08 DIAGNOSIS — E669 Obesity, unspecified: Secondary | ICD-10-CM | POA: Diagnosis present

## 2023-09-08 DIAGNOSIS — M109 Gout, unspecified: Secondary | ICD-10-CM | POA: Diagnosis present

## 2023-09-08 DIAGNOSIS — Z9049 Acquired absence of other specified parts of digestive tract: Secondary | ICD-10-CM

## 2023-09-08 DIAGNOSIS — Z7901 Long term (current) use of anticoagulants: Secondary | ICD-10-CM

## 2023-09-08 DIAGNOSIS — K760 Fatty (change of) liver, not elsewhere classified: Secondary | ICD-10-CM | POA: Diagnosis present

## 2023-09-08 DIAGNOSIS — D500 Iron deficiency anemia secondary to blood loss (chronic): Secondary | ICD-10-CM | POA: Diagnosis present

## 2023-09-08 DIAGNOSIS — Z87442 Personal history of urinary calculi: Secondary | ICD-10-CM

## 2023-09-08 DIAGNOSIS — Z79899 Other long term (current) drug therapy: Secondary | ICD-10-CM

## 2023-09-08 DIAGNOSIS — R4182 Altered mental status, unspecified: Secondary | ICD-10-CM | POA: Diagnosis present

## 2023-09-08 DIAGNOSIS — F0390 Unspecified dementia without behavioral disturbance: Secondary | ICD-10-CM | POA: Diagnosis present

## 2023-09-08 DIAGNOSIS — Z905 Acquired absence of kidney: Secondary | ICD-10-CM

## 2023-09-08 DIAGNOSIS — Z8673 Personal history of transient ischemic attack (TIA), and cerebral infarction without residual deficits: Secondary | ICD-10-CM

## 2023-09-08 DIAGNOSIS — N390 Urinary tract infection, site not specified: Secondary | ICD-10-CM | POA: Diagnosis present

## 2023-09-08 DIAGNOSIS — D649 Anemia, unspecified: Secondary | ICD-10-CM

## 2023-09-08 DIAGNOSIS — N189 Chronic kidney disease, unspecified: Secondary | ICD-10-CM

## 2023-09-08 LAB — URINALYSIS, ROUTINE W REFLEX MICROSCOPIC
Bilirubin Urine: NEGATIVE
Glucose, UA: NEGATIVE mg/dL
Hgb urine dipstick: NEGATIVE
Ketones, ur: NEGATIVE mg/dL
Nitrite: NEGATIVE
Protein, ur: NEGATIVE mg/dL
Specific Gravity, Urine: 1.016 (ref 1.005–1.030)
WBC, UA: >50 WBC/hpf (ref 0–5)
pH: 5 (ref 5.0–8.0)

## 2023-09-08 LAB — COMPREHENSIVE METABOLIC PANEL
ALT: 29 U/L (ref 0–44)
AST: 55 U/L — ABNORMAL HIGH (ref 15–41)
Albumin: 1.9 g/dL — ABNORMAL LOW (ref 3.5–5.0)
Alkaline Phosphatase: 605 U/L — ABNORMAL HIGH (ref 38–126)
Anion gap: 14 (ref 5–15)
BUN: 28 mg/dL — ABNORMAL HIGH (ref 8–23)
CO2: 17 mmol/L — ABNORMAL LOW (ref 22–32)
Calcium: 8.2 mg/dL — ABNORMAL LOW (ref 8.9–10.3)
Chloride: 106 mmol/L (ref 98–111)
Creatinine, Ser: 1.92 mg/dL — ABNORMAL HIGH (ref 0.44–1.00)
GFR, Estimated: 28 mL/min — ABNORMAL LOW (ref >60)
Glucose, Bld: 126 mg/dL — ABNORMAL HIGH (ref 70–99)
Potassium: 3.7 mmol/L (ref 3.5–5.1)
Sodium: 137 mmol/L (ref 135–145)
Total Bilirubin: 3 mg/dL — ABNORMAL HIGH (ref <1.2)
Total Protein: 6.3 g/dL — ABNORMAL LOW (ref 6.5–8.1)

## 2023-09-08 LAB — CBC WITH DIFFERENTIAL/PLATELET
Abs Immature Granulocytes: 0.37 10*3/uL — ABNORMAL HIGH (ref 0.00–0.07)
Basophils Absolute: 0 10*3/uL (ref 0.0–0.1)
Basophils Relative: 0 %
Eosinophils Absolute: 0 10*3/uL (ref 0.0–0.5)
Eosinophils Relative: 0 %
HCT: 24.1 % — ABNORMAL LOW (ref 36.0–46.0)
Hemoglobin: 7.1 g/dL — ABNORMAL LOW (ref 12.0–15.0)
Immature Granulocytes: 2 %
Lymphocytes Relative: 8 %
Lymphs Abs: 1.8 10*3/uL (ref 0.7–4.0)
MCH: 29.3 pg (ref 26.0–34.0)
MCHC: 29.5 g/dL — ABNORMAL LOW (ref 30.0–36.0)
MCV: 99.6 fL (ref 80.0–100.0)
Monocytes Absolute: 1.1 10*3/uL — ABNORMAL HIGH (ref 0.1–1.0)
Monocytes Relative: 5 %
Neutro Abs: 18.6 10*3/uL — ABNORMAL HIGH (ref 1.7–7.7)
Neutrophils Relative %: 85 %
Platelets: 333 10*3/uL (ref 150–400)
RBC: 2.42 MIL/uL — ABNORMAL LOW (ref 3.87–5.11)
RDW: 20.1 % — ABNORMAL HIGH (ref 11.5–15.5)
WBC: 21.9 10*3/uL — ABNORMAL HIGH (ref 4.0–10.5)
nRBC: 0.3 % — ABNORMAL HIGH (ref 0.0–0.2)

## 2023-09-08 LAB — CBC
HCT: 25.3 % — ABNORMAL LOW (ref 36.0–46.0)
Hemoglobin: 7.6 g/dL — ABNORMAL LOW (ref 12.0–15.0)
MCH: 29.7 pg (ref 26.0–34.0)
MCHC: 30 g/dL (ref 30.0–36.0)
MCV: 98.8 fL (ref 80.0–100.0)
Platelets: 347 10*3/uL (ref 150–400)
RBC: 2.56 MIL/uL — ABNORMAL LOW (ref 3.87–5.11)
RDW: 20.4 % — ABNORMAL HIGH (ref 11.5–15.5)
WBC: 25.1 10*3/uL — ABNORMAL HIGH (ref 4.0–10.5)
nRBC: 0.6 % — ABNORMAL HIGH (ref 0.0–0.2)

## 2023-09-08 LAB — AMMONIA: Ammonia: 56 umol/L — ABNORMAL HIGH (ref 9–35)

## 2023-09-08 LAB — RESP PANEL BY RT-PCR (RSV, FLU A&B, COVID)  RVPGX2
Influenza A by PCR: NEGATIVE
Influenza B by PCR: NEGATIVE
Resp Syncytial Virus by PCR: NEGATIVE
SARS Coronavirus 2 by RT PCR: NEGATIVE

## 2023-09-08 LAB — PREPARE RBC (CROSSMATCH)

## 2023-09-08 MED ORDER — ACETAMINOPHEN 650 MG RE SUPP: 650.0000 mg | Freq: Four times a day (QID) | RECTAL | Status: AC | PRN

## 2023-09-08 MED ORDER — ENOXAPARIN SODIUM 30 MG/0.3ML IJ SOSY
30.0000 mg | PREFILLED_SYRINGE | INTRAMUSCULAR | Status: DC
  Administered 2023-09-09: 30 mg via SUBCUTANEOUS
  Filled 2023-09-08: qty 0.3

## 2023-09-08 MED ORDER — LACTATED RINGERS IV SOLN: INTRAVENOUS | Status: AC

## 2023-09-08 MED ORDER — SODIUM CHLORIDE 0.9% IV SOLUTION: Freq: Once | INTRAVENOUS | Status: AC

## 2023-09-08 MED ORDER — DEXTROSE 5 % IV SOLN
500.0000 mg | INTRAVENOUS | Status: DC
  Administered 2023-09-09 – 2023-09-11 (×3): 500 mg via INTRAVENOUS
  Filled 2023-09-08 (×3): qty 5

## 2023-09-08 MED ORDER — LACTULOSE ENEMA
300.0000 mL | Freq: Once | ORAL | Status: AC
  Administered 2023-09-09: 300 mL via RECTAL
  Filled 2023-09-08: qty 300

## 2023-09-08 MED ORDER — ACETAMINOPHEN 325 MG PO TABS
650.0000 mg | ORAL_TABLET | Freq: Four times a day (QID) | ORAL | Status: AC | PRN
Start: 2023-09-08 — End: 2023-09-09
  Administered 2023-09-09 (×2): 650 mg via ORAL
  Filled 2023-09-08 (×2): qty 2

## 2023-09-08 MED ORDER — SODIUM CHLORIDE 0.9 % IV SOLN
1.0000 g | INTRAVENOUS | Status: DC
  Administered 2023-09-08 – 2023-09-13 (×6): 1 g via INTRAVENOUS
  Filled 2023-09-08 (×6): qty 10

## 2023-09-08 NOTE — Assessment & Plan Note (Signed)
-  controlled. Holding home meds since not able to take orals.

## 2023-09-08 NOTE — ED Notes (Signed)
2102 CBC and differential delayed until after the blood transfusion per MD.

## 2023-09-08 NOTE — Assessment & Plan Note (Signed)
Holding statin since not able to take oral medication due to lethargy

## 2023-09-08 NOTE — ED Provider Notes (Signed)
[place] EMERGENCY DEPARTMENT AT [facility] Provider Note   CSN: [id number] Arrival date & time: 09/08/23  1837     History  Chief Complaint  Patient presents with   Fatigue    [name] is a 67 y.o. female with a history of metastatic colon cancer with recurrent GI bleeding presenting for generalized weakness.  Has a history of recurrent anemia requiring blood transfusions.  No active treatment for metastatic colon cancer with known metastases to lung.  Wishing for palliative care.  Has not initiated hospice care yet.  Has been living under the care of her son.  She has been increasingly weak over the last week or so.  Unable to stand on her own today.  Last bowel movement was yesterday patient denies GI bleeding.  No fevers chills chest pain shortness of breath.  She is followed by Dr.Yeng with hematology oncology here.  HPI     Home Medications Prior to Admission medications   Medication Sig Start Date End Date Taking? Authorizing Provider  acetaminophen (TYLENOL) 325 MG tablet Take 650 mg by mouth every 6 (six) hours as needed (General Discomfort).    [provider]  acetaminophen (TYLENOL) 500 MG tablet Take 2 tablets (1,000 mg total) by mouth every 6 (six) hours. Patient not taking: Reported on 08/19/2023 12/18/21   Carlena Bjornstad A, PA-C  allopurinol (ZYLOPRIM) 300 MG tablet Take 300 mg by mouth daily. 05/11/19   [provider]  amLODipine (NORVASC) 5 MG tablet Take 1 tablet (5 mg total) by mouth daily. 01/01/22   Lorin Glass, MD  docusate sodium (COLACE) 100 MG capsule Take 1 capsule (100 mg total) by mouth 2 (two) times daily. 12/18/21   Meuth, Brooke A, PA-C  ELIQUIS 5 MG TABS tablet Take 5 mg by mouth 2 (two) times daily. Patient not taking: Reported on 08/19/2023 04/16/22   [provider]  gabapentin (NEURONTIN) 300 MG capsule Take 300 mg by mouth 3 (three) times daily. 11/26/21   [provider]  lovastatin (MEVACOR) 20  MG tablet Take 20 mg by mouth daily. 10/10/21   [provider]  metoprolol tartrate (LOPRESSOR) 25 MG tablet Take 25 mg by mouth 2 (two) times daily. 10/01/21   [provider]  Multiple Vitamins-Minerals (MULTIVITAMIN-MINERALS PO) Take 1 tablet by mouth daily.    [provider]  oxyCODONE (OXY IR/ROXICODONE) 5 MG immediate release tablet Take 1 tablet (5 mg total) by mouth every 6 (six) hours as needed for severe pain. Patient not taking: Reported on 08/19/2023 12/30/21   Hollice Espy, MD  oxyCODONE-acetaminophen (PERCOCET/ROXICET) 5-325 MG tablet Take 1 tablet by mouth 4 (four) times daily as needed. Patient not taking: Reported on 08/19/2023 04/16/22   [provider]  tamsulosin (FLOMAX) 0.4 MG CAPS capsule Take 0.4 mg by mouth daily. Patient not taking: Reported on 08/19/2023 10/11/22   [provider]  Vitamin D, Ergocalciferol, (DRISDOL) 1.25 MG (50000 UNIT) CAPS capsule Take 50,000 Units by mouth once a week. 12/19/22   [provider]      Allergies    Patient has no known allergies.    Review of Systems   Review of Systems  Physical Exam Updated Vital Signs BP 105/70   Pulse 96   Temp 98.2 F (36.8 C) (Oral)   Resp (!) 22   Ht 5\' 1"  (1.549 m)   Wt 82.6 kg   SpO2 93%   BMI 34.39 kg/m  Physical Exam Vitals and  nursing note reviewed.  HENT:     Head: Normocephalic and atraumatic.  Eyes:     Pupils: Pupils are equal, round, and reactive to light.  Cardiovascular:     Rate and Rhythm: Normal rate and regular rhythm.  Pulmonary:     Effort: Pulmonary effort is normal.     Breath sounds: Normal breath sounds.  Abdominal:     Palpations: Abdomen is soft.     Tenderness: There is no abdominal tenderness.  Skin:    General: Skin is warm and dry.     Coloration: Skin is pale.  Neurological:     Mental Status: She is alert.  Psychiatric:        Mood and Affect: Mood normal.     ED Results / Procedures /  Treatments   Labs (all labs ordered are listed, but only abnormal results are displayed) Labs Reviewed  CBC - Abnormal; Notable for the following components:      Result Value   WBC 25.1 (*)    RBC 2.56 (*)    Hemoglobin 7.6 (*)    HCT 25.3 (*)    RDW 20.4 (*)    nRBC 0.6 (*)    All other components within normal limits  COMPREHENSIVE METABOLIC PANEL - Abnormal; Notable for the following components:   CO2 17 (*)    Glucose, Bld 126 (*)    BUN 28 (*)    Creatinine, Ser 1.92 (*)    Calcium 8.2 (*)    Total Protein 6.3 (*)    Albumin 1.9 (*)    AST 55 (*)    Alkaline Phosphatase 605 (*)    Total Bilirubin 3.0 (*)    GFR, Estimated 28 (*)    All other components within normal limits  CBC WITH DIFFERENTIAL/PLATELET  TYPE AND SCREEN  PREPARE RBC (CROSSMATCH)    EKG None  Radiology No results found.  Procedures .Critical Care  Performed by: Royanne Foots, DO Authorized by: Royanne Foots, DO   Critical care provider statement:    Critical care time (minutes):  8   Critical care time was exclusive of:  Separately billable procedures and treating other patients and teaching time   Critical care was necessary to treat or prevent imminent or life-threatening deterioration of the following conditions:  Circulatory failure   Critical care was time spent personally by me on the following activities:  Development of treatment plan with patient or surrogate, discussions with consultants, evaluation of patient's response to treatment, examination of patient, ordering and review of laboratory studies, ordering and review of radiographic studies, ordering and performing treatments and interventions, pulse oximetry, re-evaluation of patient's condition and review of old charts   I assumed direction of critical care for this patient from another provider in my specialty: no     Care discussed with: admitting provider       Medications Ordered in ED Medications  0.9 %  sodium  chloride infusion (Manually program via Guardrails IV Fluids) (has no administration in time range)    ED Course/ Medical Decision Making/ A&P Clinical Course as of 09/08/23 2135  Tue Sep 08, 2023  2135 Discussed with admitting hospitalist who accepts patient for admission.  We are still waiting on blood to come up for transfusion.  Will send for repeat CBC after transfusion.  Patient has remained hemodynamically stable. [MP]    Clinical Course User Index [MP] Royanne Foots, DO  Medical Decision Making 67 year old female with history as above presenting for generalized weakness x 1 week.  Known metastatic colon cancer with metastases to lungs.  History of recurrent blood loss anemia requiring transfusions.  Hemodynamically stable here today.  Exam notable for pallor and generalized weakness.  Low suspicion for active brisk GI bleed. Will provide 1 unit RBCs.  Given patient's increased generalized weakness and inability to stand she will likely require admission  Amount and/or Complexity of Data Reviewed Labs: ordered.  Risk Prescription drug management. Decision regarding hospitalization.           Final Clinical Impression(s) / ED Diagnoses Final diagnoses:  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)  Anemia, unspecified type  Generalized weakness    Rx / DC Orders ED Discharge Orders     None         Royanne Foots, DO 09/08/23 2136

## 2023-09-08 NOTE — ED Triage Notes (Signed)
Pt arrived via POV with son and daughter in law. C/o fatigue for over a week that has gotten worse since yesterday. Pt has not eaten anything today.  Stage 4 colon cancer under palliative care.  Hx of needing blood transfusions.

## 2023-09-08 NOTE — Assessment & Plan Note (Signed)
-  PT did not want treatment and is being followed by oncology on palliative care -continue PRN pain control

## 2023-09-08 NOTE — Assessment & Plan Note (Signed)
Hgb baseline around 7. This is 7.6 on presentation. Low suspicion of this being the cause of her acute lethargic.  -one unit of blood already started in ED so will continue and follow post H/H

## 2023-09-08 NOTE — H&P (Addendum)
History and Physical    Patient: [name]:[id number] DOB: 1956-05-16 DOA: 09/08/2023 DOS: the patient was seen and examined on 09/08/2023 PCP: [name], MD  Patient coming from: Home  Chief Complaint:  Chief Complaint  Patient presents with   Fatigue   HPI: [name] is a 67 y.o. female with medical history significant of metastatic colon cancer to liver on palliative care, chronic blood loss anemia requiring transfusions,  CVA, left nephrectomy secondary to renal cell carcinoma who presents with weakness.   Son and daughter in law at bedside provides history as pt was lethargic and could not stay awake long enough to provide history.   Patient recently sent to rehab for weakness and just returned home about 2 days ago. Since she has returned home she mostly has been sleeping in bed and barely wakes up to have a conversation with her family. At baseline she is able to ambulate with a walker. Family has noted coughing all last night but improved today. No noted labored respiration. Has noted increase abdominal distention but pt reported bowel movement at rehab a few days ago. Pt denies abdominal pain, dysuria.  She is on Gabapentin TID but family only gave her one dose today since they did not want her sedated further.  She has chronic blood loss anemia requiring blood transfusion from hematology.   On arrival to ED, she was afebrile, HR 116, bp 102/63 on room air.   CBC with leukocytosis of 25K, Hgb of 7.6 which is around her baseline. Plt within normal limits. Her last blood transfusion with hematology was on 08/13/23  CMP with elevated creatinine of 1.92 from prior of 1.72.CO2 of 17, Normal anion gap of 14. AST mildly elevated at 55, AST of 25, Alkaline phosphatase of 605 which is chronic for her.   1u PRBC was ordered in ED. Hospitalist then consulted for admission.  Review of Systems: As mentioned in the history of present illness. All other systems reviewed and are  negative. 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    acquired;  left nephrectomy 07-18-2015 for RCC   TIA (transient ischemic attack)    Wears glasses    Past Surgical History:  Procedure Laterality Date   ANKLE CLOSED REDUCTION Left 12/04/2021   Procedure: CLOSED REDUCTION LEFT ANKLE SPLINT;  Surgeon: Netta Cedars, MD;  Location: [facility] OR;  Service: Orthopedics;  Laterality: Left;   CESAREAN SECTION  x2  yrs ago   CESAREAN SECTION     CHOLECYSTECTOMY N/A 07/21/2019   Procedure: LAPAROSCOPIC CHOLECYSTECTOMY WITH INTRAOPERATIVE CHOLANGIOGRAM;  Surgeon: [name], De Blanch, MD;  Location: [facility];  Service: General;  Laterality: N/A;   CHOLECYSTECTOMY, LAPAROSCOPIC  07/21/2019   CYSTOSCOPY WITH RETROGRADE PYELOGRAM, URETEROSCOPY AND STENT PLACEMENT Bilateral 06/08/2015   Procedure: CYSTOSCOPY WITH RETROGRADE PYELOGRAM, URETEROSCOPY, STONE EXTRACTION  AND STENT PLACEMENT ON THE RIGHT, DIAGNOSTIC URETEROSCOPY AND STENT PLACEMENT ON THE LEFT;  Surgeon: Sebastian Ache, MD;  Location: [facility];  Service: Urology;  Laterality: Bilateral;   HOLMIUM LASER APPLICATION Right 06/08/2015   Procedure: HOLMIUM LASER APPLICATION;  Surgeon: Sebastian Ache, MD;  Location: [facility];  Service: Urology;  Laterality: Right;   LAPAROTOMY N/A 12/04/2021   Procedure: EXPLORATORY LAPAROTOMY small bowel resection;  Surgeon: Fritzi Mandes, MD;  Location: [facility];  Service: General;  Laterality: N/A;    NEPHRECTOMY Left 07/18/2015   ORIF ANKLE FRACTURE Left 12/10/2021   Procedure: OPEN REDUCTION INTERNAL FIXATION (ORIF) ANKLE FRACTURE;  Surgeon: Myrene Galas, MD;  Location: [facility] OR;  Service: Orthopedics;  Laterality: Left;   ORIF TIBIA PLATEAU Right 12/10/2021   Procedure: OPEN REDUCTION INTERNAL FIXATION (ORIF) TIBIAL PLATEAU;  Surgeon: Myrene Galas, MD;  Location: [facility] OR;  Service: Orthopedics;  Laterality: Right;   ROBOT ASSISTED LAPAROSCOPIC NEPHRECTOMY Left 07/18/2015   Procedure: ROBOTIC ASSISTED LAPAROSCOPIC NEPHRECTOMY;  Surgeon: Sebastian Ache, MD;  Location: [facility];  Service: Urology;  Laterality: Left;   TUBAL LIGATION Bilateral yrs ago   TUBAL LIGATION     Social History:  reports that she has never smoked. She has never used smokeless tobacco. She reports that she does not drink alcohol and does not use drugs.  No Known Allergies  Family History  Problem Relation Age of Onset   Hyperlipidemia Mother    Gout Mother    Hypertension Mother    Pancreatitis Sister     Prior to Admission medications   Medication Sig Start Date End Date Taking? Authorizing Provider  acetaminophen (TYLENOL) 325 MG tablet Take 650 mg by mouth every 6 (six) hours as needed (General Discomfort).    [provider]  acetaminophen (TYLENOL) 500 MG tablet Take 2 tablets (1,000 mg total) by mouth every 6 (six) hours. Patient not taking: Reported on 08/19/2023 12/18/21   Carlena Bjornstad A, PA-C  allopurinol (ZYLOPRIM) 300 MG tablet Take 300 mg by mouth daily. 05/11/19   [provider]  amLODipine (NORVASC) 5 MG tablet Take 1 tablet (5 mg total) by mouth daily. 01/01/22   Lorin Glass, MD  docusate sodium (COLACE) 100 MG capsule Take 1 capsule (100 mg total) by mouth 2 (two) times daily. 12/18/21   Meuth, Brooke A, PA-C  ELIQUIS 5 MG TABS tablet Take 5 mg by mouth 2 (two) times daily. Patient not taking: Reported on 08/19/2023 04/16/22   [provider]  gabapentin (NEURONTIN) 300 MG  capsule Take 300 mg by mouth 3 (three) times daily. 11/26/21   [provider]  lovastatin (MEVACOR) 20 MG tablet Take 20 mg by mouth daily. 10/10/21   [provider]  metoprolol tartrate (LOPRESSOR) 25 MG tablet Take 25 mg by mouth 2 (two) times daily. 10/01/21   [provider]  Multiple Vitamins-Minerals (MULTIVITAMIN-MINERALS PO) Take 1 tablet by mouth daily.    [provider]  oxyCODONE (OXY IR/ROXICODONE) 5 MG immediate release tablet Take 1 tablet (5 mg total) by mouth every 6 (six) hours as needed for severe pain. Patient not taking: Reported on 08/19/2023 12/30/21   Hollice Espy, MD  oxyCODONE-acetaminophen (PERCOCET/ROXICET) 5-325 MG tablet Take 1 tablet by mouth 4 (four) times daily as needed. Patient not taking: Reported on 08/19/2023 04/16/22   [provider]  tamsulosin (FLOMAX) 0.4 MG CAPS capsule Take 0.4 mg by mouth daily. Patient not taking: Reported on 08/19/2023 10/11/22   [provider]  Vitamin D, Ergocalciferol, (DRISDOL) 1.25 MG (50000 UNIT) CAPS capsule Take 50,000 Units by mouth once a week. 12/19/22   [provider]    Physical Exam: Vitals:   09/08/23 2100 09/08/23 2151 09/08/23 2200 09/08/23 2218  BP: 104/61 102/78 115/62 110/67  Pulse: 95 97 96 94  Resp: (!) 24 (!) 22 18 17   Temp:  (!) 97.4 F (36.3 C) 98.1 F (36.7 C) 98.4 F (36.9 C)  TempSrc:  Oral Axillary Oral  SpO2: 94% 93% 93% 93%  Weight:      Height:       Constitutional: NAD, calm, lethargic elderly female lying in bed. Awoke to noxious stimuli but would drift back to sleep almost immediate.  Eyes:  lids and conjunctivae normal ENMT: Mucous membranes are moist.  Neck: normal, supple Respiratory: clear to auscultation bilaterally, no wheezing, no crackles. Normal respiratory effort. No accessory muscle use.  Cardiovascular: Regular rate and rhythm, no murmurs / rubs / gallops. +2 pitting edema of bilateral LE up to mid-tibial  region Abdomen: soft,no grimace with palpation, moderately distended Musculoskeletal: no clubbing / cyanosis. No joint deformity upper and lower extremities. Normal muscle tone.  Skin: no rashes, lesions, ulcers. No induration Neurologic: CN 2-12 grossly intact. Lethargic.  Psychiatric:Unable to access with lethargy  Data Reviewed:  SEE HPI  Assessment and Plan: * Acute metabolic encephalopathy -admission initially requested for weakness though likely due to anemia. However per hx pt is not only weak but has been more lethargic and bedbound in the past 2 days. She is afebrile, but tachycardia and WBC has trended up to 25K concerning for infection. -will obtain stat CXR and UA  -test RSV/Flu/COVID PCR -obtain ammonia level   Acute kidney injury superimposed on chronic kidney disease (HCC) AKI on CKD stage 4- solitary kidney with hx of left nephrectomy from renal cell carinoma -elevated creatinine of 1.92 from prior of 1.72 - LE edema on exam so will just keep on gentle IV fluids  HLD (hyperlipidemia) Holding statin since not able to take oral medication due to lethargy  HTN (hypertension) -controlled. Holding home meds since not able to take orals.  Chronic blood loss anemia Hgb baseline around 7. This is 7.6 on presentation. Low suspicion of this being the cause of her acute lethargic.  -one unit of blood already started in ED so will continue and follow post H/H  Metastatic colon cancer to liver (HCC) -PT did not want treatment and is being followed by oncology on palliative care -continue PRN pain control      Advance Care Planning:Full  Consults: none  Family Communication: son and daughter-in-law at bedside  Severity of Illness: The appropriate patient status for this patient is OBSERVATION. Observation status is judged to be reasonable and necessary in order to provide the required intensity of service to ensure the patient's safety. The patient's presenting  symptoms, physical exam findings, and initial radiographic and laboratory data in the context of their medical condition is felt to place them at decreased risk for further clinical deterioration. Furthermore, it is anticipated that the patient will be medically stable for discharge from the hospital within 2 midnights of admission.   Author: Anselm Jungling, DO 09/08/2023 10:22 PM  For on call review [email or web address].

## 2023-09-08 NOTE — Assessment & Plan Note (Addendum)
-  admission initially requested for weakness though likely due to anemia. However per hx pt is not only weak but has been more lethargic and bedbound in the past 2 days. She is afebrile, but tachycardia and WBC has trended up to 25K concerning for infection. -will obtain stat CXR and UA  -test RSV/Flu/COVID PCR -obtain ammonia level

## 2023-09-08 NOTE — Assessment & Plan Note (Addendum)
AKI on CKD stage 4- solitary kidney with hx of left nephrectomy from renal cell carinoma -elevated creatinine of 1.92 from prior of 1.72 - LE edema on exam so will just keep on gentle IV fluids

## 2023-09-09 ENCOUNTER — Encounter (HOSPITAL_COMMUNITY): Payer: Self-pay | Admitting: Family Medicine

## 2023-09-09 DIAGNOSIS — N390 Urinary tract infection, site not specified: Secondary | ICD-10-CM | POA: Diagnosis present

## 2023-09-09 DIAGNOSIS — D649 Anemia, unspecified: Secondary | ICD-10-CM

## 2023-09-09 DIAGNOSIS — E785 Hyperlipidemia, unspecified: Secondary | ICD-10-CM | POA: Diagnosis present

## 2023-09-09 DIAGNOSIS — G9341 Metabolic encephalopathy: Secondary | ICD-10-CM | POA: Diagnosis present

## 2023-09-09 DIAGNOSIS — Z66 Do not resuscitate: Secondary | ICD-10-CM | POA: Diagnosis present

## 2023-09-09 DIAGNOSIS — E871 Hypo-osmolality and hyponatremia: Secondary | ICD-10-CM | POA: Diagnosis not present

## 2023-09-09 DIAGNOSIS — R531 Weakness: Secondary | ICD-10-CM

## 2023-09-09 DIAGNOSIS — M109 Gout, unspecified: Secondary | ICD-10-CM | POA: Diagnosis present

## 2023-09-09 DIAGNOSIS — D500 Iron deficiency anemia secondary to blood loss (chronic): Secondary | ICD-10-CM | POA: Diagnosis present

## 2023-09-09 DIAGNOSIS — Z515 Encounter for palliative care: Secondary | ICD-10-CM | POA: Diagnosis not present

## 2023-09-09 DIAGNOSIS — B961 Klebsiella pneumoniae [K. pneumoniae] as the cause of diseases classified elsewhere: Secondary | ICD-10-CM | POA: Diagnosis present

## 2023-09-09 DIAGNOSIS — C7801 Secondary malignant neoplasm of right lung: Secondary | ICD-10-CM | POA: Diagnosis present

## 2023-09-09 DIAGNOSIS — C7800 Secondary malignant neoplasm of unspecified lung: Secondary | ICD-10-CM

## 2023-09-09 DIAGNOSIS — Z1152 Encounter for screening for COVID-19: Secondary | ICD-10-CM | POA: Diagnosis not present

## 2023-09-09 DIAGNOSIS — G629 Polyneuropathy, unspecified: Secondary | ICD-10-CM | POA: Diagnosis present

## 2023-09-09 DIAGNOSIS — R6520 Severe sepsis without septic shock: Secondary | ICD-10-CM | POA: Diagnosis present

## 2023-09-09 DIAGNOSIS — E669 Obesity, unspecified: Secondary | ICD-10-CM | POA: Diagnosis present

## 2023-09-09 DIAGNOSIS — N179 Acute kidney failure, unspecified: Secondary | ICD-10-CM | POA: Diagnosis present

## 2023-09-09 DIAGNOSIS — F0390 Unspecified dementia without behavioral disturbance: Secondary | ICD-10-CM | POA: Diagnosis present

## 2023-09-09 DIAGNOSIS — N184 Chronic kidney disease, stage 4 (severe): Secondary | ICD-10-CM | POA: Diagnosis present

## 2023-09-09 DIAGNOSIS — A4159 Other Gram-negative sepsis: Secondary | ICD-10-CM | POA: Diagnosis present

## 2023-09-09 DIAGNOSIS — K760 Fatty (change of) liver, not elsewhere classified: Secondary | ICD-10-CM | POA: Diagnosis present

## 2023-09-09 DIAGNOSIS — C787 Secondary malignant neoplasm of liver and intrahepatic bile duct: Secondary | ICD-10-CM | POA: Diagnosis present

## 2023-09-09 DIAGNOSIS — C7802 Secondary malignant neoplasm of left lung: Secondary | ICD-10-CM | POA: Diagnosis present

## 2023-09-09 DIAGNOSIS — J189 Pneumonia, unspecified organism: Secondary | ICD-10-CM | POA: Diagnosis present

## 2023-09-09 DIAGNOSIS — I129 Hypertensive chronic kidney disease with stage 1 through stage 4 chronic kidney disease, or unspecified chronic kidney disease: Secondary | ICD-10-CM | POA: Diagnosis present

## 2023-09-09 DIAGNOSIS — Z905 Acquired absence of kidney: Secondary | ICD-10-CM | POA: Diagnosis not present

## 2023-09-09 DIAGNOSIS — C189 Malignant neoplasm of colon, unspecified: Secondary | ICD-10-CM | POA: Diagnosis present

## 2023-09-09 LAB — TYPE AND SCREEN
ABO/RH(D): A POS
Antibody Screen: NEGATIVE
Unit division: 0

## 2023-09-09 LAB — CBC
HCT: 28.8 % — ABNORMAL LOW (ref 36.0–46.0)
Hemoglobin: 8.7 g/dL — ABNORMAL LOW (ref 12.0–15.0)
MCH: 29.1 pg (ref 26.0–34.0)
MCHC: 30.2 g/dL (ref 30.0–36.0)
MCV: 96.3 fL (ref 80.0–100.0)
Platelets: 319 10*3/uL (ref 150–400)
RBC: 2.99 MIL/uL — ABNORMAL LOW (ref 3.87–5.11)
RDW: 20.4 % — ABNORMAL HIGH (ref 11.5–15.5)
WBC: 19.3 10*3/uL — ABNORMAL HIGH (ref 4.0–10.5)
nRBC: 0.4 % — ABNORMAL HIGH (ref 0.0–0.2)

## 2023-09-09 LAB — COMPREHENSIVE METABOLIC PANEL
ALT: 28 U/L (ref 0–44)
AST: 48 U/L — ABNORMAL HIGH (ref 15–41)
Albumin: 1.8 g/dL — ABNORMAL LOW (ref 3.5–5.0)
Alkaline Phosphatase: 543 U/L — ABNORMAL HIGH (ref 38–126)
Anion gap: 14 (ref 5–15)
BUN: 28 mg/dL — ABNORMAL HIGH (ref 8–23)
CO2: 19 mmol/L — ABNORMAL LOW (ref 22–32)
Calcium: 8.2 mg/dL — ABNORMAL LOW (ref 8.9–10.3)
Chloride: 102 mmol/L (ref 98–111)
Creatinine, Ser: 1.8 mg/dL — ABNORMAL HIGH (ref 0.44–1.00)
GFR, Estimated: 30 mL/min — ABNORMAL LOW (ref >60)
Glucose, Bld: 93 mg/dL (ref 70–99)
Potassium: 3.5 mmol/L (ref 3.5–5.1)
Sodium: 135 mmol/L (ref 135–145)
Total Bilirubin: 3.1 mg/dL — ABNORMAL HIGH (ref <1.2)
Total Protein: 6 g/dL — ABNORMAL LOW (ref 6.5–8.1)

## 2023-09-09 LAB — BPAM RBC
Blood Product Expiration Date: 202411292359
ISSUE DATE / TIME: 202411052157
Unit Type and Rh: 6200

## 2023-09-09 LAB — HIV ANTIBODY (ROUTINE TESTING W REFLEX): HIV Screen 4th Generation wRfx: NONREACTIVE

## 2023-09-09 MED ORDER — OXYCODONE HCL 5 MG PO TABS
5.0000 mg | ORAL_TABLET | Freq: Four times a day (QID) | ORAL | Status: DC | PRN
  Administered 2023-09-09 – 2023-09-13 (×10): 5 mg via ORAL
  Filled 2023-09-09 (×11): qty 1

## 2023-09-09 MED ORDER — ACETAMINOPHEN 325 MG PO TABS
650.0000 mg | ORAL_TABLET | ORAL | Status: DC | PRN
  Administered 2023-09-09 – 2023-09-14 (×15): 650 mg via ORAL
  Filled 2023-09-09 (×15): qty 2

## 2023-09-09 MED ORDER — POLYETHYLENE GLYCOL 3350 17 G PO PACK
17.0000 g | PACK | Freq: Every day | ORAL | Status: DC
  Administered 2023-09-11: 17 g via ORAL
  Filled 2023-09-09 (×6): qty 1

## 2023-09-09 NOTE — Progress Notes (Signed)
[facility]                                                                              [name], is a 67 y.o. female, DOB - 20-Aug-1956, VHQ:[id number] Admit date - 09/08/2023    Outpatient Primary MD for the patient is [name], MD  LOS - 0  days  Chief Complaint  Patient presents with   Fatigue       Brief summary   Patient is a 67 year old female with metastatic colon cancer to liver, who has not initiated hospice care yet, chronic blood loss anemia requiring transfusions, prior CVA, left nephrectomy secondary to renal cell CA presented with generalized weakness, lethargy.  Patient was recently discharged to [name] on [facility], had returned home 2 days prior.  Per family since she had returned home she had been mostly sleeping in bed and barely wakes up to have conversation.  At baseline was able to ambulate with a walker.  Family also reported coughing, no labored respiration, abdominal distention, no abdominal pain. In ED, afebrile, HR 116, BP 102/63, on room air Mildly elevated creatinine 1.9 from prior 1.72, alkaline phosphatase 605, chronic. Leukocytosis 20 5K, hemoglobin 7.6 last transfusion was on 10/10  Assessment & Plan    Principal Problem:   Acute metabolic encephalopathy -Patient was noted to be more lethargic, bedbound in the past 2 days at home -Currently alert and oriented, appears close to her baseline. -Chest x-ray showed low lung volumes with right basilar worsening airspace opacity concerning for pneumonia. Leukocytosis 25.1, improving to 19.3 today -UA showed many bacteria,> 50 WBCs, large leukocytes -Continue IV Rocephin, Zithromax -Ammonia level 56-> received lactulose enema  Active Problems: UTI - -UA showed many bacteria,> 50 WBCs, large leukocytes -Continue IV Rocephin.  Urine culture not sent in ED, will add on from the ED UA sample  Right basilar pneumonia -Continue IV Zithromax, Rocephin     Acute kidney injury  superimposed on chronic kidney disease (HCC) stage IV -History of left nephrectomy from renal cell carcinoma -Elevated creatinine of 1.92 from prior of 1.7 -Continue gentle hydration, improving 1.8 today    Metastatic colon cancer to liver [facility]) -Follows Dr. Mosetta Putt, last seen on 08/06/2023.  Per office note, patient has repeatedly declined systemic therapy, continue supportive care and transition to hospice when she is ready -Palliative medicine consult    Chronic blood loss anemia -Hemoglobin 7.6 on admission, was 7.9 on 08/18/2023, receives outpatient transfusions -Hemoglobin 8.7 today, received 1 unit packed RBC on 11/5    HTN (hypertension) -BP currently stable    HLD (hyperlipidemia) -Hold statin  Obesity Estimated body mass index is 34.39 kg/m as calculated from the following:   Height as of this encounter: 5\' 1"  (1.549 m).   Weight as of this encounter: 82.6 kg.  Code Status: DNR DVT Prophylaxis:  enoxaparin (LOVENOX) injection 30 mg Start: 09/09/23 1000   Level of Care: Level of care: Med-Surg Family Communication: Updated patient Disposition Plan:      Remains inpatient appropriate: PT OT evaluation   Procedures:  None  Consultants:   Palliative medicine  Antimicrobials:   Anti-infectives (  From admission, onward)    Start     Dose/Rate Route Frequency Ordered Stop   09/09/23 0000  cefTRIAXone (ROCEPHIN) 1 g in sodium chloride 0.9 % 100 mL IVPB        1 g 200 mL/hr over 30 Minutes Intravenous Every 24 hours 09/08/23 2309     09/09/23 0000  azithromycin (ZITHROMAX) 500 mg in dextrose 5 % 250 mL IVPB        500 mg 250 mL/hr over 60 Minutes Intravenous Every 24 hours 09/08/23 2309            Medications  enoxaparin (LOVENOX) injection  30 mg Subcutaneous Q24H      Subjective:   [name] was seen and examined today.  Much more alert and oriented today.  Denies any specific complaints.  No dizziness, chest pain or acute shortness of breath.    Objective:   Vitals:   09/08/23 2311 09/09/23 0022 09/09/23 0449 09/09/23 0942  BP: 122/75 124/76 127/78 127/89  Pulse: 98 97 94 96  Resp: 18 18 18 20   Temp: 98 F (36.7 C) 97.7 F (36.5 C) 98 F (36.7 C) 97.7 F (36.5 C)  TempSrc:    Oral  SpO2: 94% 96% 97% 97%  Weight:      Height:        Intake/Output Summary (Last 24 hours) at 09/09/2023 1119 Last data filed at 09/09/2023 1000 Gross per 24 hour  Intake 1110.95 ml  Output 400 ml  Net 710.95 ml     Wt Readings from Last 3 Encounters:  09/08/23 82.6 kg  08/18/23 88 kg  08/15/23 88.9 kg     Exam General: Alert and oriented x 3, NAD Cardiovascular: S1 S2 auscultated,  RRR Respiratory: Clear to auscultation bilaterally, no wheezing, rales or rhonchi Gastrointestinal: Soft, nontender, nondistended, + bowel sounds Ext: no pedal edema bilaterally Neuro: no new deficits Psych: Normal affect     Data Reviewed:  I have personally reviewed following labs    CBC Lab Results  Component Value Date   WBC 19.3 (H) 09/09/2023   RBC 2.99 (L) 09/09/2023   HGB 8.7 (L) 09/09/2023   HCT 28.8 (L) 09/09/2023   MCV 96.3 09/09/2023   MCH 29.1 09/09/2023   PLT 319 09/09/2023   MCHC 30.2 09/09/2023   RDW 20.4 (H) 09/09/2023   LYMPHSABS 1.8 09/08/2023   MONOABS 1.1 (H) 09/08/2023   EOSABS 0.0 09/08/2023   BASOSABS 0.0 09/08/2023     Last metabolic panel Lab Results  Component Value Date   NA 135 09/09/2023   K 3.5 09/09/2023   CL 102 09/09/2023   CO2 19 (L) 09/09/2023   BUN 28 (H) 09/09/2023   CREATININE 1.80 (H) 09/09/2023   GLUCOSE 93 09/09/2023   GFRNONAA 30 (L) 09/09/2023   GFRAA 27 (L) 07/18/2019   CALCIUM 8.2 (L) 09/09/2023   PHOS 3.4 05/16/2019   PROT 6.0 (L) 09/09/2023   ALBUMIN 1.8 (L) 09/09/2023   BILITOT 3.1 (H) 09/09/2023   ALKPHOS 543 (H) 09/09/2023   AST 48 (H) 09/09/2023   ALT 28 09/09/2023   ANIONGAP 14 09/09/2023    CBG (last 3)  No results for input(s): "GLUCAP" in the last 72 hours.     Coagulation Profile: No results for input(s): "INR", "PROTIME" in the last 168 hours.   Radiology Studies: I have personally reviewed the imaging studies  DG CHEST PORT 1 VIEW  Result Date: 09/08/2023 CLINICAL DATA:  Cough, weakness EXAM: PORTABLE CHEST 1  VIEW COMPARISON:  06/04/2023 FINDINGS: Cardiomegaly, vascular congestion. Low lung volumes. Right basilar airspace opacity, increasing since prior study. No confluent opacity on the left. No effusions or acute bony abnormality. IMPRESSION: Cardiomegaly, vascular congestion. Low lung volumes with right basilar worsening airspace opacity concerning for pneumonia. Electronically Signed   By: Charlett Nose M.D.   On: 09/08/2023 23:00       Aristeo Hankerson M.D. [facility] 09/09/2023, 11:19 AM  Available via Epic secure chat 7am-7pm After 7 pm, please refer to night coverage provider listed on amion.

## 2023-09-09 NOTE — Evaluation (Signed)
Physical Therapy Evaluation Patient Details Name: [name] MRN: [id number] DOB: 1956-08-17 Today's Date: 09/09/2023  History of Present Illness  67 year old female with metastatic colon cancer to liver, who has not initiated hospice care yet, chronic blood loss anemia requiring transfusions, prior CVA, left nephrectomy secondary to renal cell CA presented with generalized weakness, lethargy.  Patient was recently discharged to [name] on 10/17; Per family since she had returned home she had been mostly sleeping in bed and barely wakes up to have conversation. admitted with UTI, pna,  metabolic encephalopathy  Clinical Impression  Pt admitted with above diagnosis.  Pt with recent admission and rehab at SNF, now significantly weaker compared to recent admission. Pt overall mod assist for bed mobility and STS transfers. Lives with son who works and cannot provide 24 hr assist. Patient may benefit from continued follow up therapy, <3 hours/day vs ?transition to hospice. Will follow in acute setting   Pt currently with functional limitations due to the deficits listed below (see PT Problem List). Pt will benefit from acute skilled PT to increase their independence and safety with mobility to allow discharge.           If plan is discharge home, recommend the following: Help with stairs or ramp for entrance;Assistance with cooking/housework;Assist for transportation;A lot of help with walking and/or transfers;Two people to help with bathing/dressing/bathroom   Can travel by private vehicle        Equipment Recommendations Other (comment) (defer to SNF)  Recommendations for Other Services       Functional Status Assessment Patient has had a recent decline in their functional status and demonstrates the ability to make significant improvements in function in a reasonable and predictable amount of time.     Precautions / Restrictions Precautions Precautions:  Fall Restrictions Weight Bearing Restrictions: No      Mobility  Bed Mobility Overal bed mobility: Needs Assistance Bed Mobility: Supine to Sit, Sit to Supine     Supine to sit: Mod assist, Min assist Sit to supine: Mod assist   General bed mobility comments: assist with LEs on and off bed, incr time and effort    Transfers Overall transfer level: Needs assistance Equipment used: Rolling walker (2 wheels) Transfers: Sit to/from Stand Sit to Stand: Mod assist, Min assist           General transfer comment: assist to rise and steady, to recover posterior sway/LOB    Ambulation/Gait               General Gait Details: assist with lateral steps along EOB using RW, min-assist to balance and maneuver RW; fatigues quickly  Stairs            Wheelchair Mobility     Tilt Bed    Modified Rankin (Stroke Patients Only)       Balance   Sitting-balance support: No upper extremity supported, Feet unsupported Sitting balance-Leahy Scale: Fair     Standing balance support: Reliant on assistive device for balance Standing balance-Leahy Scale: Poor Standing balance comment: reliant on device/UEs and external assist                             Pertinent Vitals/Pain Pain Assessment Pain Assessment: Faces Faces Pain Scale: Hurts little more Pain Location: headache and diffuse pain with movement Pain Descriptors / Indicators: Aching Pain Intervention(s): Monitored during session    Home Living Family/patient expects to be discharged  to:: Private residence Living Arrangements: Other relatives;Children Available Help at Discharge: Family;Available PRN/intermittently Type of Home: House Home Access: Stairs to enter Entrance Stairs-Rails: Right Entrance Stairs-Number of Steps: 4   Home Layout: One level Home Equipment: Rollator (4 wheels)      Prior Function Prior Level of Function : Needs assist             Mobility Comments:  ambulates with rollator, son assists with amb , reports falls ADLs Comments: son assisting with toilet transfers     Extremity/Trunk Assessment   Upper Extremity Assessment Upper Extremity Assessment: Defer to OT evaluation    Lower Extremity Assessment Lower Extremity Assessment: Generalized weakness       Communication      Cognition Arousal: Alert Behavior During Therapy: WFL for tasks assessed/performed Overall Cognitive Status: Within Functional Limits for tasks assessed                                          General Comments      Exercises     Assessment/Plan    PT Assessment Patient needs continued PT services  PT Problem List Decreased mobility;Decreased safety awareness;Decreased activity tolerance;Decreased balance;Decreased knowledge of use of DME;Decreased strength       PT Treatment Interventions DME instruction;Therapeutic exercise;Gait training;Functional mobility training;Therapeutic activities;Patient/family education    PT Goals (Current goals can be found in the Care Plan section)  Acute Rehab PT Goals Patient Stated Goal: pt not sure PT Goal Formulation: With patient Time For Goal Achievement: 09/23/23 Potential to Achieve Goals: Fair    Frequency Min 1X/week     Co-evaluation               AM-PAC PT "6 Clicks" Mobility  Outcome Measure Help needed turning from your back to your side while in a flat bed without using bedrails?: A Lot Help needed moving from lying on your back to sitting on the side of a flat bed without using bedrails?: A Lot Help needed moving to and from a bed to a chair (including a wheelchair)?: A Lot Help needed standing up from a chair using your arms (e.g., wheelchair or bedside chair)?: A Lot Help needed to walk in hospital room?: Total Help needed climbing 3-5 steps with a railing? : Total 6 Click Score: 10    End of Session Equipment Utilized During Treatment: Gait belt Activity  Tolerance: Patient limited by fatigue;Patient limited by pain Patient left: in bed;with call bell/phone within reach;with nursing/sitter in room   PT Visit Diagnosis: Unsteadiness on feet (R26.81);History of falling (Z91.81)    Time: 7829-5621 PT Time Calculation (min) (ACUTE ONLY): 20 min   Charges:   PT Evaluation $PT Eval Low Complexity: 1 Low   PT General Charges $$ ACUTE PT VISIT: 1 Visit         Shastina Rua, PT  Acute Rehab Dept [facility]) [phone]  09/09/2023   [facility] 09/09/2023, 5:17 PM

## 2023-09-10 DIAGNOSIS — C189 Malignant neoplasm of colon, unspecified: Secondary | ICD-10-CM | POA: Diagnosis not present

## 2023-09-10 DIAGNOSIS — Z7189 Other specified counseling: Secondary | ICD-10-CM

## 2023-09-10 DIAGNOSIS — D649 Anemia, unspecified: Secondary | ICD-10-CM | POA: Diagnosis not present

## 2023-09-10 DIAGNOSIS — R531 Weakness: Secondary | ICD-10-CM | POA: Diagnosis not present

## 2023-09-10 DIAGNOSIS — R4589 Other symptoms and signs involving emotional state: Secondary | ICD-10-CM

## 2023-09-10 DIAGNOSIS — Z515 Encounter for palliative care: Secondary | ICD-10-CM

## 2023-09-10 DIAGNOSIS — G9341 Metabolic encephalopathy: Secondary | ICD-10-CM | POA: Diagnosis not present

## 2023-09-10 LAB — CBC
HCT: 27.5 % — ABNORMAL LOW (ref 36.0–46.0)
Hemoglobin: 8.3 g/dL — ABNORMAL LOW (ref 12.0–15.0)
MCH: 29 pg (ref 26.0–34.0)
MCHC: 30.2 g/dL (ref 30.0–36.0)
MCV: 96.2 fL (ref 80.0–100.0)
Platelets: 291 10*3/uL (ref 150–400)
RBC: 2.86 MIL/uL — ABNORMAL LOW (ref 3.87–5.11)
RDW: 20.7 % — ABNORMAL HIGH (ref 11.5–15.5)
WBC: 15.8 10*3/uL — ABNORMAL HIGH (ref 4.0–10.5)
nRBC: 0.6 % — ABNORMAL HIGH (ref 0.0–0.2)

## 2023-09-10 LAB — BASIC METABOLIC PANEL
Anion gap: 13 (ref 5–15)
BUN: 21 mg/dL (ref 8–23)
CO2: 21 mmol/L — ABNORMAL LOW (ref 22–32)
Calcium: 8.1 mg/dL — ABNORMAL LOW (ref 8.9–10.3)
Chloride: 99 mmol/L (ref 98–111)
Creatinine, Ser: 1.49 mg/dL — ABNORMAL HIGH (ref 0.44–1.00)
GFR, Estimated: 38 mL/min — ABNORMAL LOW (ref >60)
Glucose, Bld: 85 mg/dL (ref 70–99)
Potassium: 4 mmol/L (ref 3.5–5.1)
Sodium: 133 mmol/L — ABNORMAL LOW (ref 135–145)

## 2023-09-10 MED ORDER — KETOROLAC TROMETHAMINE 15 MG/ML IJ SOLN
15.0000 mg | Freq: Once | INTRAMUSCULAR | Status: AC
  Administered 2023-09-10: 15 mg via INTRAVENOUS
  Filled 2023-09-10: qty 1

## 2023-09-10 MED ORDER — PROCHLORPERAZINE EDISYLATE 10 MG/2ML IJ SOLN
10.0000 mg | Freq: Once | INTRAMUSCULAR | Status: AC
  Administered 2023-09-10: 10 mg via INTRAVENOUS
  Filled 2023-09-10: qty 2

## 2023-09-10 MED ORDER — ENOXAPARIN SODIUM 40 MG/0.4ML IJ SOSY
40.0000 mg | PREFILLED_SYRINGE | INTRAMUSCULAR | Status: DC
  Administered 2023-09-10 – 2023-09-14 (×5): 40 mg via SUBCUTANEOUS
  Filled 2023-09-10 (×5): qty 0.4

## 2023-09-10 NOTE — Plan of Care (Signed)
  Problem: Education: Goal: Knowledge of General Education information will improve Description: Including pain rating scale, medication(s)/side effects and non-pharmacologic comfort measures Outcome: Progressing   Problem: Coping: Goal: Level of anxiety will decrease Outcome: Progressing   

## 2023-09-10 NOTE — Progress Notes (Signed)
[facility]                                                                              [name], is a 67 y.o. female, DOB - 1956/09/04, [id number]:[id number] Admit date - 09/08/2023    Outpatient Primary MD for the patient is [name], MD  LOS - 1  days  Chief Complaint  Patient presents with   Fatigue       Brief summary   Patient is a 67 year old female with metastatic colon cancer to liver, who has not initiated hospice care yet, chronic blood loss anemia requiring transfusions, prior CVA, left nephrectomy secondary to renal cell CA presented with generalized weakness, lethargy.  Patient was recently [facility] to [name] on 10/17, had returned home 2 days prior.  Per family since she had returned home she had been mostly sleeping in bed and barely wakes up to have conversation.  At baseline was able to ambulate with a walker.  Family also reported coughing, no labored respiration, abdominal distention, no abdominal pain. In ED, afebrile, HR 116, BP 102/63, on room air Mildly elevated creatinine 1.9 from prior 1.72, alkaline phosphatase 605, chronic. Leukocytosis 20 5K, hemoglobin 7.6 last transfusion was on 10/10  Assessment & Plan    Principal Problem:   Acute metabolic encephalopathy -Patient was noted to be more lethargic, bedbound in the past 2 days at home -Currently alert and oriented, appears close to her baseline. -Chest x-ray showed low lung volumes with right basilar worsening airspace opacity concerning for pneumonia. Leukocytosis 25.1 on admission, improving -UA showed many bacteria,> 50 WBCs, large leukocytes, urine culture showed more than 100,000 colonies of Klebsiella pneumonia -Continue IV Rocephin, Zithromax -Ammonia level 56-> received lactulose enema -Today alert and oriented, sitting up in the bed  Active Problems: Klebsiella pneumonia -Urine culture showed more than 100,000 colonies of Klebsiella pneumonia -Continue IV  Rocephin.  Follow sensitivities  Right basilar pneumonia -Continue IV Zithromax, Rocephin     Acute kidney injury superimposed on chronic kidney disease (HCC) stage IV -History of left nephrectomy from renal cell carcinoma -Elevated creatinine of 1.92 from prior of 1.7 -Creatinine improving, 1.4 with IV fluid hydration    Metastatic colon cancer to liver [facility]) -Follows Dr. Mosetta Putt, last seen on 08/06/2023.  Per office note, patient has repeatedly declined systemic therapy, continue supportive care and transition to hospice when she is ready -Palliative medicine consult    Chronic blood loss anemia -Hemoglobin 7.6 on admission, was 7.9 on 08/18/2023, receives outpatient transfusions - received 1 unit packed RBC on 11/5 -H&H stable, 8.3    HTN (hypertension) -BP currently stable    HLD (hyperlipidemia) -Hold statin  Obesity Estimated body mass index is 34.39 kg/m as calculated from the following:   Height as of this encounter: 5\' 1"  (1.549 m).   Weight as of this encounter: 82.6 kg.  Code Status: DNR DVT Prophylaxis:  enoxaparin (LOVENOX) injection 40 mg Start: 09/10/23 1000   Level of Care: Level of care: Med-Surg Family Communication: Updated patient Disposition Plan:      Remains inpatient appropriate: PT recommended SNF   Procedures:  None  Consultants:   Palliative medicine  Antimicrobials:   Anti-infectives (From admission, onward)    Start     Dose/Rate Route Frequency Ordered Stop   09/09/23 0000  cefTRIAXone (ROCEPHIN) 1 g in sodium chloride 0.9 % 100 mL IVPB        1 g 200 mL/hr over 30 Minutes Intravenous Every 24 hours 09/08/23 2309     09/09/23 0000  azithromycin (ZITHROMAX) 500 mg in dextrose 5 % 250 mL IVPB        500 mg 250 mL/hr over 60 Minutes Intravenous Every 24 hours 09/08/23 2309            Medications  enoxaparin (LOVENOX) injection  40 mg Subcutaneous Q24H   polyethylene glycol  17 g Oral Daily      Subjective:   [name] was seen and examined today.  Alert and oriented, no acute complaints.  No chest pain, shortness of breath, dizziness nausea vomiting. + Generalized weakness    Objective:   Vitals:   09/09/23 2051 09/10/23 0611 09/10/23 0900 09/10/23 1305  BP: 131/84 123/80 130/75 136/88  Pulse: 95 (!) 102 (!) 110 (!) 104  Resp: 20 18 18 17   Temp: 98.5 F (36.9 C) 98.8 F (37.1 C) 97.9 F (36.6 C) 98.1 F (36.7 C)  TempSrc: Oral  Oral   SpO2: 98% 95% 97% 97%  Weight:      Height:        Intake/Output Summary (Last 24 hours) at 09/10/2023 1313 Last data filed at 09/10/2023 0947 Gross per 24 hour  Intake 2269.91 ml  Output 450 ml  Net 1819.91 ml     Wt Readings from Last 3 Encounters:  09/08/23 82.6 kg  08/18/23 88 kg  08/15/23 88.9 kg    Physical Exam General: Alert and oriented x 3, NAD Cardiovascular: S1 S2 clear, RRR.  Respiratory: CTAB, no wheezing Gastrointestinal: Soft, nontender, nondistended, NBS Ext: no pedal edema bilaterally Neuro: no new deficits Psych: Normal affect     Data Reviewed:  I have personally reviewed following labs    CBC Lab Results  Component Value Date   WBC 15.8 (H) 09/10/2023   RBC 2.86 (L) 09/10/2023   HGB 8.3 (L) 09/10/2023   HCT 27.5 (L) 09/10/2023   MCV 96.2 09/10/2023   MCH 29.0 09/10/2023   PLT 291 09/10/2023   MCHC 30.2 09/10/2023   RDW 20.7 (H) 09/10/2023   LYMPHSABS 1.8 09/08/2023   MONOABS 1.1 (H) 09/08/2023   EOSABS 0.0 09/08/2023   BASOSABS 0.0 09/08/2023     Last metabolic panel Lab Results  Component Value Date   NA 133 (L) 09/10/2023   K 4.0 09/10/2023   CL 99 09/10/2023   CO2 21 (L) 09/10/2023   BUN 21 09/10/2023   CREATININE 1.49 (H) 09/10/2023   GLUCOSE 85 09/10/2023   GFRNONAA 38 (L) 09/10/2023   GFRAA 27 (L) 07/18/2019   CALCIUM 8.1 (L) 09/10/2023   PHOS 3.4 05/16/2019   PROT 6.0 (L) 09/09/2023   ALBUMIN 1.8 (L) 09/09/2023   BILITOT 3.1 (H) 09/09/2023   ALKPHOS 543 (H) 09/09/2023   AST 48 (H)  09/09/2023   ALT 28 09/09/2023   ANIONGAP 13 09/10/2023    CBG (last 3)  No results for input(s): "GLUCAP" in the last 72 hours.    Coagulation Profile: No results for input(s): "INR", "PROTIME" in the last 168 hours.   Radiology Studies: I have personally reviewed the imaging studies  DG CHEST PORT 1 VIEW  Result Date: 09/08/2023 CLINICAL DATA:  Cough, weakness EXAM: PORTABLE CHEST 1 VIEW COMPARISON:  06/04/2023 FINDINGS: Cardiomegaly, vascular congestion. Low lung volumes. Right basilar airspace opacity, increasing since prior study. No confluent opacity on the left. No effusions or acute bony abnormality. IMPRESSION: Cardiomegaly, vascular congestion. Low lung volumes with right basilar worsening airspace opacity concerning for pneumonia. Electronically Signed   By: Charlett Nose M.D.   On: 09/08/2023 23:00       Jatavis Malek M.D. [facility] 09/10/2023, 1:13 PM  Available via Epic secure chat 7am-7pm After 7 pm, please refer to night coverage provider listed on amion.

## 2023-09-10 NOTE — TOC Initial Note (Signed)
Transition of Care [facility]) - Initial/Assessment Note    Patient Details  Name: [name] MRN: [id number] Date of Birth: [birth year]  Transition of Care [facility]) CM/SW Contact:    Amada Jupiter, LCSW Phone Number: 09/10/2023, 3:19 PM  Clinical Narrative:                 Met with pt today to review therapy recommendations for SNF rehab.  Pt a little lethargic but able to discuss plans.  She is in agreement with need for SNF again and notes she was at [facility] for brief stay (~2 weeks) in October.  She does note her preferred facility would be [facility] in [place].  Pt able to meet with PMT MD earlier today and I encouraged pt to consider palliative to follow at SNF if she is agreeable.  Will begin SNF placement process and follow up with pt tomorrow.  Expected Discharge Plan: Skilled Nursing Facility Barriers to Discharge: Insurance Authorization, SNF Pending bed offer   Patient Goals and CMS Choice Patient states their goals for this hospitalization and ongoing recovery are:: to return home following SNF rehab CMS Medicare.gov Compare Post Acute Care list provided to:: Patient Choice offered to / list presented to : Patient [place] ownership interest in [facility].provided to:: Patient    Expected Discharge Plan and Services In-house Referral: Clinical Social Work   Post Acute Care Choice: Skilled Nursing Facility Living arrangements for the past 2 months: Single Family Home                                      Prior Living Arrangements/Services Living arrangements for the past 2 months: Single Family Home Lives with:: Relatives Patient language and need for interpreter reviewed:: Yes Do you feel safe going back to the place where you live?: Yes      Need for Family Participation in Patient Care: Yes (Comment) Care giver support system in place?: Yes (comment)   Criminal Activity/Legal Involvement Pertinent to Current Situation/Hospitalization: No -  Comment as needed  Activities of Daily Living   ADL Screening (condition at time of admission) Independently performs ADLs?: No Does the patient have a NEW difficulty with bathing/dressing/toileting/self-feeding that is expected to last >3 days?: No Does the patient have a NEW difficulty with getting in/out of bed, walking, or climbing stairs that is expected to last >3 days?: No Does the patient have a NEW difficulty with communication that is expected to last >3 days?: No Is the patient deaf or have difficulty hearing?: No Does the patient have difficulty seeing, even when wearing glasses/contacts?: No Does the patient have difficulty concentrating, remembering, or making decisions?: No  Permission Sought/Granted Permission sought to share information with : Family Supports Permission granted to share information with : Yes, Verbal Permission Granted  Share Information with NAME: son, Zala Degrasse @ [phone]           Emotional Assessment Appearance:: Appears stated age Attitude/Demeanor/Rapport: Gracious Affect (typically observed): Accepting Orientation: : Oriented to Self, Oriented to Place, Oriented to  Time, Oriented to Situation Alcohol / Substance Use: Not Applicable Psych Involvement: No (comment)  Admission diagnosis:  Generalized weakness [R53.1] 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) [C18.9, C78.00] Anemia, unspecified type [D64.9] AMS (altered mental status) [R41.82] Patient Active Problem List   Diagnosis Date Noted   Palliative care encounter 09/10/2023   Goals of care, counseling/discussion 09/10/2023  Need for emotional support 09/10/2023   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) 09/10/2023   AMS (altered mental status) 09/08/2023   Acute metabolic encephalopathy 09/08/2023   HTN (hypertension) 09/08/2023   HLD (hyperlipidemia) 09/08/2023   DNR (do not resuscitate) 04/01/2023   Blood clotting disorder (HCC) 07/23/2022   Chronic blood loss anemia  07/18/2022   Obesity (BMI 30-39.9) 12/28/2021   Acute kidney injury superimposed on chronic kidney disease (HCC) 12/27/2021   Hypotension 12/27/2021   UTI (urinary tract infection) 12/27/2021   Chronic anemia 12/27/2021   Metastatic colon cancer to liver (HCC) 12/27/2021   Leukocytosis 12/27/2021   Closed C2 fracture (HCC) 12/27/2021   History of nephrectomy, left 12/27/2021   Multisystem blunt trauma 12/04/2021   Physical deconditioning 05/20/2019   Oxygen dependent    Sepsis without acute organ dysfunction (HCC)    E coli bacteremia 05/15/2019   Shock liver 05/15/2019   Septic shock (HCC) 05/15/2019   Gallstone pancreatitis 05/12/2019   Renal mass 07/18/2015   PCP:  [name], MD Pharmacy:   [facility] [phone] - [place], [place] - [address] [place] [address] Phone: [phone] Fax: [phone]     Social Determinants of Health (SDOH) Social History: SDOH Screenings   Food Insecurity: No Food Insecurity (09/09/2023)  Housing: Low Risk  (09/09/2023)  Transportation Needs: No Transportation Needs (09/09/2023)  Utilities: Not At Risk (09/09/2023)  Tobacco Use: Low Risk  (09/09/2023)   SDOH Interventions:     Readmission Risk Interventions    09/10/2023    3:11 PM  Readmission Risk Prevention Plan  Transportation Screening Complete  PCP or Specialist Appt within 5-7 Days Complete  Home Care Screening Complete  Medication Review (RN CM) Complete

## 2023-09-10 NOTE — Consult Note (Signed)
Consultation Note Date: 09/10/2023   Patient Name: [name]  DOB: 04/24/1956  MRN: [id number]  Age / Sex: 67 y.o., female   PCP: [name], MD Referring Physician: Cathren Harsh, MD  Reason for Consultation: Establishing goals of care     Chief Complaint/History of Present Illness:   Patient is a 67 year old female with a past medical history of metastatic colon cancer with disease to liver, chronic blood loss anemia requiring transfusions, prior CVA, and left nephrectomy secondary to renal cell cancer who was admitted on 09/08/2023 for management of generalized weakness and lethargy.  Patient was recently discharged to SNF on 10/17 and had returned home 2 days prior to admission.  Family had reported worsening mentation and coughing.  Since admission patient has received management for urinary tract infection, pneumonia, AKI on CKD, and chronic blood loss anemia.  Palliative medicine team consulted to assist with complex medical decision making.  Extensive review of EMR prior to presenting to bedside.  Patient has advance care planning documentation on file.  Patient's HCPOA is Comptroller.  Primary alternate agent is Conrad [place].  Patient has elected that her life not be prolonged by life prolonging measures in the following situations: Condition that cannot be cured and will result in death within a relatively short period of time, oncologist and doctor determined to a high degree of medical certainty patient will never regain consciousness, or suffers from advanced dementia or any other condition which resulted in substantial loss of my ability to think and doctors determined that to a high degree of medical certainty this is not going to get better. Patient has also discussed care with her outpatient oncologist, Dr. Mosetta Putt.  Patient has already elected that she does not want to undergo cancer directed therapies.  Patient has elected to continue with blood transfusions as these continue to  allow her quality time with family.  Presented to bedside to meet with patient.  Patient laying in bed resting.  Patient easily awakened.  Introduced self as a member of the palliative medicine team and role in patient's care.  Patient easily able to discuss she has been admitted for management of urinary tract infection and pneumonia.  Patient discussed that she tell she had worsening weakness at home for the past couple of days.  Patient notes she is already feeling improved at this time.  Patient does note she has a cough though expects this with the pneumonia. Patient again reiterates that she is not seeking cancer directed therapies.  Patient does want to continue with blood transfusions and return to the hospital if needed as she feels this continues to allow her time with family.  Patient agreed with ACP documentation already on file.  Patient did not have any symptoms of concern at this time.  Patient wanting to continue with appropriate medical care.  Patient already DNR/DNI.  All questions answered at that time.  Noted palliative medicine team continue to follow with patient's medical journey.  Primary Diagnoses  Present on Admission:  Metastatic colon cancer to liver [facility])  AMS (altered mental status)   Palliative Review of Systems: Cough though improving   Past Medical History:  Diagnosis Date   Anemia    Fatty liver    History of gout    last episode left foot 2010   History of kidney stones    History of septic shock    05-12-2019  w/ bacteremia;  gallstone pancreatitis--- resolved   History of transient ischemic attack (  TIA)    04-16-2008  --  per pt no residual   Hyperlipidemia    Hypertension    Peripheral neuropathy    Personal history of renal cell carcinoma urologist-  dr Berneice Heinrich   07-17-2005  s/p  left nephrectomy , clear cell type   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    acquired;  left nephrectomy 07-18-2015 for RCC   TIA (transient ischemic  attack)    Wears glasses    Social History   Socioeconomic History   Marital status: Single    Spouse name: Not on file   Number of children: Not on file   Years of education: Not on file   Highest education level: Not on file  Occupational History   Not on file  Tobacco Use   Smoking status: Never   Smokeless tobacco: Never  Vaping Use   Vaping status: Never Used  Substance and Sexual Activity   Alcohol use: Never   Drug use: Never   Sexual activity: Not on file  Other Topics Concern   Not on file  Social History Narrative   ** Merged History Encounter **       Social Determinants of Health   Financial Resource Strain: Not on file  Food Insecurity: No Food Insecurity (09/09/2023)   Hunger Vital Sign    Worried About Running Out of Food in the Last Year: Never true    Ran Out of Food in the Last Year: Never true  Transportation Needs: No Transportation Needs (09/09/2023)   PRAPARE - Administrator, Civil Service (Medical): No    Lack of Transportation (Non-Medical): No  Physical Activity: Not on file  Stress: Not on file  Social Connections: Not on file   Family History  Problem Relation Age of Onset   Hyperlipidemia Mother    Gout Mother    Hypertension Mother    Pancreatitis Sister    Scheduled Meds:  enoxaparin (LOVENOX) injection  40 mg Subcutaneous Q24H   polyethylene glycol  17 g Oral Daily   Continuous Infusions:  azithromycin 500 mg (09/10/23 0032)   cefTRIAXone (ROCEPHIN)  IV 1 g (09/09/23 2356)   PRN Meds:.acetaminophen, oxyCODONE No Known Allergies CBC:    Component Value Date/Time   WBC 15.8 (H) 09/10/2023 0432   HGB 8.3 (L) 09/10/2023 0432   HGB 7.8 (L) 08/06/2023 1232   HCT 27.5 (L) 09/10/2023 0432   PLT 291 09/10/2023 0432   PLT 357 08/06/2023 1232   MCV 96.2 09/10/2023 0432   NEUTROABS 18.6 (H) 09/08/2023 2233   LYMPHSABS 1.8 09/08/2023 2233   MONOABS 1.1 (H) 09/08/2023 2233   EOSABS 0.0 09/08/2023 2233   BASOSABS 0.0  09/08/2023 2233   Comprehensive Metabolic Panel:    Component Value Date/Time   NA 133 (L) 09/10/2023 0432   K 4.0 09/10/2023 0432   CL 99 09/10/2023 0432   CO2 21 (L) 09/10/2023 0432   BUN 21 09/10/2023 0432   CREATININE 1.49 (H) 09/10/2023 0432   CREATININE 1.82 (H) 08/06/2023 1232   GLUCOSE 85 09/10/2023 0432   CALCIUM 8.1 (L) 09/10/2023 0432   AST 48 (H) 09/09/2023 0442   AST 21 08/06/2023 1232   ALT 28 09/09/2023 0442   ALT 14 08/06/2023 1232   ALKPHOS 543 (H) 09/09/2023 0442   BILITOT 3.1 (H) 09/09/2023 0442   BILITOT 0.5 08/06/2023 1232   PROT 6.0 (L) 09/09/2023 0442   ALBUMIN 1.8 (L) 09/09/2023 4010  Physical Exam: Vital Signs: BP 123/80 (BP Location: Right Arm)   Pulse (!) 102   Temp 98.8 F (37.1 C)   Resp 18   Ht 5\' 1"  (1.549 m)   Wt 82.6 kg   SpO2 95%   BMI 34.39 kg/m  SpO2: SpO2: 95 % O2 Device: O2 Device: Room Air O2 Flow Rate:   Intake/output summary:  Intake/Output Summary (Last 24 hours) at 09/10/2023 0911 Last data filed at 09/10/2023 8119 Gross per 24 hour  Intake 2689.67 ml  Output 500 ml  Net 2189.67 ml   LBM: Last BM Date : 09/09/23 Baseline Weight: Weight: 82.6 kg Most recent weight: Weight: 82.6 kg  General: Laying in bed, chronically ill-appearing, interactive HENT:  moist mucous membranes Cardiovascular: Tachycardia noted Respiratory: no increased work of breathing noted, not in respiratory distress Neuro: A&Ox4, following commands easily Psych: appropriately answers all questions          Palliative Performance Scale: 50%              Additional Data Reviewed: Recent Labs    09/09/23 0442 09/10/23 0432  WBC 19.3* 15.8*  HGB 8.7* 8.3*  PLT 319 291  NA 135 133*  BUN 28* 21  CREATININE 1.80* 1.49*    Imaging: DG CHEST PORT 1 VIEW CLINICAL DATA:  Cough, weakness  EXAM: PORTABLE CHEST 1 VIEW  COMPARISON:  06/04/2023  FINDINGS: Cardiomegaly, vascular congestion. Low lung volumes. Right basilar airspace opacity,  increasing since prior study. No confluent opacity on the left. No effusions or acute bony abnormality.  IMPRESSION: Cardiomegaly, vascular congestion.  Low lung volumes with right basilar worsening airspace opacity concerning for pneumonia.  Electronically Signed   By: Charlett Nose M.D.   On: 09/08/2023 23:00    I personally reviewed recent imaging.   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  Patient was recently discharged to SNF on 10/17 and had returned home 2 days prior to admission.  Family had reported worsening mentation and coughing.  Since admission patient has received management for urinary tract infection, pneumonia, AKI on CKD, and chronic blood loss anemia.  Palliative medicine team consulted to assist with complex medical decision making.  # Complex medical decision making/goals of care  -Discussed care with patient as detailed above in HPI.  Again confirmed patient is not seeking cancer directed therapies.  Patient does want to continue receiving blood transfusions and returning to the hospital as needed as she feels these interventions are allowing her quality time with family.  Patient has discussed that when she feels these interventions are no longer providing her benefit, then could consider involvement of hospice.  -Patient has advance care planning documentation on file.  Patient's HCPOA is Comptroller.  Primary alternate agent is Conrad [place].  Patient has elected that her life not be prolonged by life prolonging measures in the following situations: Condition that cannot be cured and will result in  death within a relatively short period of time, oncologist and doctor determined to a high degree of medical certainty patient will never regain consciousness, or suffers from advanced dementia or any other condition which resulted in substantial loss of my ability  to think and doctors determined that to a high degree of medical certainty this is not going to get better.  -  Code Status: Limited: Do not attempt resuscitation (DNR) -DNR-LIMITED -Do Not Intubate/DNI    # Psycho-social/Spiritual Support:  - Support System: Son- Roger/HCPOA; sister- Warden Fillers- primary alert agent   # Discharge Planning:  To Be Determined  Thank you for allowing the palliative care team to participate in the care Radie A Osker Mason, DO Palliative Care Provider PMT # [phone]  If patient remains symptomatic despite maximum doses, please call PMT at [phone] between 0700 and 1900. Outside of these hours, please call attending, as PMT does not have night coverage.  Personally spent 81 minutes in patient care including extensive chart review (labs, imaging, progress/consult notes, vital signs), medically appropraite exam, discussed with treatment team, education to patient, family, and staff, documenting clinical information, medication review and management, coordination of care, and available advanced directive documents.   *Please note that this is a verbal dictation therefore any spelling or grammatical errors are due to the "Dragon [employer] One" system interpretation.

## 2023-09-10 NOTE — Evaluation (Signed)
Occupational Therapy Evaluation Patient Details Name: [name] MRN: [id number] DOB: [birth year] Today's Date: 09/10/2023   History of Present Illness 67 year old female with metastatic colon cancer to liver, who has not initiated hospice care yet, chronic blood loss anemia requiring transfusions, prior CVA, left nephrectomy secondary to renal cell CA presented with generalized weakness, lethargy.  Patient was recently [facility] to [name]) on [facility]/17; Per family since she had returned home she had been mostly sleeping in bed and barely wakes up to have conversation. admitted with UTI, pna,  metabolic encephalopathy   Clinical Impression   Pt recently discharged home from SNF and was able to ambulate with a RW short distances. Son was assisting with ADL tasks. Currently requires mod A to stand and take several steps toward [facility] @ RW level; overall Max A with LB ADL tasks. Declined mobilizing to chair due to HA.  Patient will benefit from continued inpatient follow up therapy, <3 hours/day. Acute OT to follow.       If plan is discharge home, recommend the following:      Functional Status Assessment  Patient has had a recent decline in their functional status and demonstrates the ability to make significant improvements in function in a reasonable and predictable amount of time.  Equipment Recommendations  None recommended by OT    Recommendations for Other Services       Precautions / Restrictions Precautions Precautions: Fall Restrictions Weight Bearing Restrictions: No      Mobility Bed Mobility Overal bed mobility: Needs Assistance Bed Mobility: Supine to Sit, Sit to Supine     Supine to sit: Mod assist, Min assist Sit to supine: Mod assist   General bed mobility comments: assist with LEs on and off bed, incr time and effort    Transfers Overall transfer level: Needs assistance Equipment used: Rolling walker (2 wheels) Transfers: Sit to/from Stand Sit to  Stand: Mod assist                  Balance Overall balance assessment: History of Falls, Needs assistance Sitting-balance support: No upper extremity supported, Feet unsupported Sitting balance-Leahy Scale: Fair     Standing balance support: Reliant on assistive device for balance Standing balance-Leahy Scale: Poor Standing balance comment: reliant on device/UEs and external assist                           ADL either performed or assessed with clinical judgement   ADL Overall ADL's : Needs assistance/impaired Eating/Feeding: Modified independent   Grooming: Set up;Sitting   Upper Body Bathing: Set up;Sitting   Lower Body Bathing: Maximal assistance;Sit to/from stand   Upper Body Dressing : Set up;Sitting   Lower Body Dressing: Maximal assistance;Sit to/from stand   Toilet Transfer: Moderate assistance;+2 for physical assistance   Toileting- Clothing Manipulation and Hygiene: Maximal assistance       Functional mobility during ADLs: Moderate assistance;+2 for physical assistance;Rolling walker (2 wheels)       Vision Baseline Vision/History: 0 No visual deficits       Perception         Praxis         Pertinent Vitals/Pain Pain Assessment Pain Assessment: 0-10 Pain Score: 10-Worst pain ever Pain Location: headache Pain Descriptors / Indicators: Aching     Extremity/Trunk Assessment Upper Extremity Assessment Upper Extremity Assessment: Generalized weakness   Lower Extremity Assessment Lower Extremity Assessment: Defer to PT evaluation   Cervical /  Trunk Assessment Cervical / Trunk Assessment: Normal;Other exceptions (increased body habitus)   Communication Communication Communication: No apparent difficulties   Cognition Arousal: Alert Behavior During Therapy: WFL for tasks assessed/performed Overall Cognitive Status: Within Functional Limits for tasks assessed                                 General Comments:  patient states that she does not have Palliative and does not know what it is(per chart, Palliative follows at home)     General Comments       Exercises Exercises: General Upper Extremity General Exercises - Upper Extremity Shoulder Flexion: AROM, Both, 10 reps Elbow Flexion: AROM, Both, 10 reps Elbow Extension: AROM, Both, 10 reps   Shoulder Instructions      Home Living Family/patient expects to be discharged to:: Skilled nursing facility Living Arrangements: Other relatives;Children Available Help at Discharge: Family;Available PRN/intermittently Type of Home: House Home Access: Stairs to enter [employer] of Steps: 4 Entrance Stairs-Rails: Right Home Layout: One level               Home Equipment: Rollator (4 wheels)          Prior Functioning/Environment Prior Level of Function : Needs assist             Mobility Comments: ambulates with rollator, son assists with amb , reports falls ADLs Comments: son assisting with toilet transfers        OT Problem List: Decreased strength;Decreased range of motion;Decreased activity tolerance;Impaired balance (sitting and/or standing);Decreased safety awareness;Decreased knowledge of use of DME or AE;Obesity;Pain      OT Treatment/Interventions: Self-care/ADL training;Therapeutic exercise;DME and/or AE instruction;Energy conservation;Therapeutic activities;Patient/family education;Balance training    OT Goals(Current goals can be found in the care plan section) Acute Rehab OT Goals Patient Stated Goal: to go to [facility] for rehab OT Goal Formulation: With patient Time For Goal Achievement: 09/24/23 Potential to Achieve Goals: Good  OT Frequency: Min 1X/week    Co-evaluation              AM-PAC OT "6 Clicks" Daily Activity     Outcome Measure Help from another person eating meals?: None Help from another person taking care of personal grooming?: A Little Help from another person  toileting, which includes using toliet, bedpan, or urinal?: A Lot Help from another person bathing (including washing, rinsing, drying)?: A Lot Help from another person to put on and taking off regular upper body clothing?: A Little Help from another person to put on and taking off regular lower body clothing?: A Lot 6 Click Score: 16   End of Session Equipment Utilized During Treatment: Gait belt;Rolling walker (2 wheels) Nurse Communication: Mobility status  Activity Tolerance: Patient limited by pain (headache) Patient left: in bed;with call bell/phone within reach;Other (comment) (nsg cleaning pt)  OT Visit Diagnosis: Unsteadiness on feet (R26.81);Other abnormalities of gait and mobility (R26.89);Muscle weakness (generalized) (M62.81);Pain Pain - part of body:  (headache)                Time: 7829-5621 OT Time Calculation (min): 23 min Charges:  OT General Charges $OT Visit: 1 Visit OT Evaluation $OT Eval Moderate Complexity: 1 Mod OT Treatments $Self Care/Home Management : 8-22 mins Luisa Dago, OT/L   Acute OT Clinical Specialist Acute Rehabilitation Services Pager [phone] Office [phone]   [facility] 09/10/2023, 3:52 PM

## 2023-09-10 NOTE — [place] FL2 (Signed)
[place] MEDICAID FL2 LEVEL OF CARE FORM     IDENTIFICATION  Patient Name: [name] Birthdate: 1955/11/09 Sex: female Admission Date (Current Location): 09/08/2023  [facility] and [place] Number:  Producer, television/film/video and Address:  [facility],  [address] [place], [place] [address]      Provider Number: [id number]  Attending Physician Name and Address:  Cathren Harsh, MD  Relative Name and Phone Number:  Acquanetta Cabanilla, son @ [phone]    Current Level of Care: Hospital Recommended Level of Care: Skilled Nursing Facility Prior Approval Number:    Date Approved/Denied:   PASRR Number: [id number]  Discharge Plan: SNF    Current Diagnoses: Patient Active Problem List   Diagnosis Date Noted   Palliative care encounter 09/10/2023   Goals of care, counseling/discussion 09/10/2023   Need for emotional support 09/10/2023   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) 09/10/2023   AMS (altered mental status) 09/08/2023   Acute metabolic encephalopathy 09/08/2023   HTN (hypertension) 09/08/2023   HLD (hyperlipidemia) 09/08/2023   DNR (do not resuscitate) 04/01/2023   Blood clotting disorder (HCC) 07/23/2022   Chronic blood loss anemia 07/18/2022   Obesity (BMI 30-39.9) 12/28/2021   Acute kidney injury superimposed on chronic kidney disease (HCC) 12/27/2021   Hypotension 12/27/2021   UTI (urinary tract infection) 12/27/2021   Chronic anemia 12/27/2021   Metastatic colon cancer to liver (HCC) 12/27/2021   Leukocytosis 12/27/2021   Closed C2 fracture (HCC) 12/27/2021   History of nephrectomy, left 12/27/2021   Multisystem blunt trauma 12/04/2021   Physical deconditioning 05/20/2019   Oxygen dependent    Sepsis without acute organ dysfunction (HCC)    E coli bacteremia 05/15/2019   Shock liver 05/15/2019   Septic shock (HCC) 05/15/2019   Gallstone pancreatitis 05/12/2019   Renal mass 07/18/2015    Orientation RESPIRATION BLADDER Height & Weight      Self, Time, Situation, Place  Normal Incontinent, External catheter (currently with purewick) Weight: 182 lb (82.6 kg) Height:  5\' 1"  (154.9 cm)  BEHAVIORAL SYMPTOMS/MOOD NEUROLOGICAL BOWEL NUTRITION STATUS      Continent Diet (regular)  AMBULATORY STATUS COMMUNICATION OF NEEDS Skin   Limited Assist Verbally Normal                       Personal Care Assistance Level of Assistance  Bathing, Dressing Bathing Assistance: Limited assistance   Dressing Assistance: Limited assistance     Functional Limitations Info  Sight, Hearing, Speech Sight Info: Adequate Hearing Info: Adequate Speech Info: Adequate    SPECIAL CARE FACTORS FREQUENCY  PT (By licensed PT), OT (By licensed OT)     PT Frequency: 5x/wk OT Frequency: 5x/wk            Contractures Contractures Info: Not present    Additional Factors Info  Code Status, Allergies Code Status Info: DNR Allergies Info: NKDA           Current Medications (09/10/2023):  This is the current hospital active medication list Current Facility-Administered Medications  Medication Dose Route Frequency Provider Last Rate Last Admin   acetaminophen (TYLENOL) tablet 650 mg  650 mg Oral Q4H PRN Rai, Ripudeep K, MD   650 mg at 09/10/23 1126   azithromycin (ZITHROMAX) 500 mg in dextrose 5 % 250 mL IVPB  500 mg Intravenous Q24H Tu, Ching T, DO 250 mL/hr at 09/10/23 0032 500 mg at 09/10/23 0032   cefTRIAXone (ROCEPHIN) 1 g in sodium chloride 0.9 %  100 mL IVPB  1 g Intravenous Q24H Tu, Ching T, DO 200 mL/hr at 09/09/23 2356 1 g at 09/09/23 2356   enoxaparin (LOVENOX) injection 40 mg  40 mg Subcutaneous Q24H Hessie Knows, RPH   40 mg at 09/10/23 2440   oxyCODONE (Oxy IR/ROXICODONE) immediate release tablet 5 mg  5 mg Oral Q6H PRN Rai, Ripudeep K, MD   5 mg at 09/10/23 1328   polyethylene glycol (MIRALAX / GLYCOLAX) packet 17 g  17 g Oral Daily Rai, Ripudeep K, MD         Discharge Medications: Please see discharge summary for a  list of discharge medications.  Relevant Imaging Results:  Relevant Lab Results:   Additional Information SSN: [id number]  Amada Jupiter, LCSW

## 2023-09-11 ENCOUNTER — Inpatient Hospital Stay: Payer: 59

## 2023-09-11 DIAGNOSIS — D649 Anemia, unspecified: Secondary | ICD-10-CM | POA: Diagnosis not present

## 2023-09-11 DIAGNOSIS — C189 Malignant neoplasm of colon, unspecified: Secondary | ICD-10-CM | POA: Diagnosis not present

## 2023-09-11 DIAGNOSIS — G9341 Metabolic encephalopathy: Secondary | ICD-10-CM | POA: Diagnosis not present

## 2023-09-11 DIAGNOSIS — R531 Weakness: Secondary | ICD-10-CM | POA: Diagnosis not present

## 2023-09-11 LAB — CBC
HCT: 29.4 % — ABNORMAL LOW (ref 36.0–46.0)
Hemoglobin: 8.8 g/dL — ABNORMAL LOW (ref 12.0–15.0)
MCH: 29 pg (ref 26.0–34.0)
MCHC: 29.9 g/dL — ABNORMAL LOW (ref 30.0–36.0)
MCV: 97 fL (ref 80.0–100.0)
Platelets: 285 10*3/uL (ref 150–400)
RBC: 3.03 MIL/uL — ABNORMAL LOW (ref 3.87–5.11)
RDW: 20.7 % — ABNORMAL HIGH (ref 11.5–15.5)
WBC: 14.1 10*3/uL — ABNORMAL HIGH (ref 4.0–10.5)
nRBC: 0.4 % — ABNORMAL HIGH (ref 0.0–0.2)

## 2023-09-11 LAB — BASIC METABOLIC PANEL
Anion gap: 12 (ref 5–15)
BUN: 19 mg/dL (ref 8–23)
CO2: 20 mmol/L — ABNORMAL LOW (ref 22–32)
Calcium: 8 mg/dL — ABNORMAL LOW (ref 8.9–10.3)
Chloride: 98 mmol/L (ref 98–111)
Creatinine, Ser: 1.37 mg/dL — ABNORMAL HIGH (ref 0.44–1.00)
GFR, Estimated: 42 mL/min — ABNORMAL LOW (ref >60)
Glucose, Bld: 80 mg/dL (ref 70–99)
Potassium: 4 mmol/L (ref 3.5–5.1)
Sodium: 130 mmol/L — ABNORMAL LOW (ref 135–145)

## 2023-09-11 LAB — URINE CULTURE: Culture: >=100000 — AB

## 2023-09-11 LAB — OSMOLALITY, URINE: Osmolality, Ur: 116 mosm/kg — ABNORMAL LOW (ref 300–900)

## 2023-09-11 LAB — TSH: TSH: 3.974 u[IU]/mL (ref 0.350–4.500)

## 2023-09-11 LAB — SODIUM, URINE, RANDOM: Sodium, Ur: 16 mmol/L

## 2023-09-11 LAB — OSMOLALITY: Osmolality: 267 mosm/kg — ABNORMAL LOW (ref 275–295)

## 2023-09-11 MED ORDER — AZITHROMYCIN 250 MG PO TABS
500.0000 mg | ORAL_TABLET | Freq: Every day | ORAL | Status: DC
  Administered 2023-09-11 – 2023-09-13 (×3): 500 mg via ORAL
  Filled 2023-09-11 (×3): qty 2

## 2023-09-11 MED ORDER — METOPROLOL TARTRATE 25 MG PO TABS
25.0000 mg | ORAL_TABLET | Freq: Two times a day (BID) | ORAL | Status: DC
  Administered 2023-09-11 – 2023-09-14 (×7): 25 mg via ORAL
  Filled 2023-09-11 (×7): qty 1

## 2023-09-11 MED ORDER — ALLOPURINOL 300 MG PO TABS
300.0000 mg | ORAL_TABLET | Freq: Every day | ORAL | Status: DC
  Administered 2023-09-11 – 2023-09-14 (×4): 300 mg via ORAL
  Filled 2023-09-11 (×4): qty 1

## 2023-09-11 MED ORDER — PRAVASTATIN SODIUM 20 MG PO TABS
20.0000 mg | ORAL_TABLET | Freq: Every day | ORAL | Status: DC
  Administered 2023-09-11 – 2023-09-13 (×3): 20 mg via ORAL
  Filled 2023-09-11 (×3): qty 1

## 2023-09-11 MED ORDER — AMLODIPINE BESYLATE 5 MG PO TABS
5.0000 mg | ORAL_TABLET | Freq: Every day | ORAL | Status: DC
  Administered 2023-09-11 – 2023-09-14 (×4): 5 mg via ORAL
  Filled 2023-09-11 (×4): qty 1

## 2023-09-11 MED ORDER — LABETALOL HCL 5 MG/ML IV SOLN: 10.0000 mg | INTRAVENOUS | Status: DC | PRN

## 2023-09-11 NOTE — Progress Notes (Signed)
Physical Therapy Treatment Patient Details Name: [name] MRN: [id number] DOB: February 14, 1956 Today's Date: 09/11/2023   History of Present Illness 67 year old female with metastatic colon cancer to liver, who has not initiated hospice care yet, chronic blood loss anemia requiring transfusions, prior CVA, left nephrectomy secondary to renal cell CA presented with generalized weakness, lethargy.  Patient was recently [facility] to [name] on [facility]17; Per family since she had returned home she had been mostly sleeping in bed and barely wakes up to have conversation. admitted with UTI, pna,  metabolic encephalopathy    PT Comments  Pt agreeable to therapy. Pt needing mod A to come to sitting EOB, increased time and effort. Pt powers to stand with mod A, attempted lateral steps at bedside but due to urinary incontinence returned to sitting. Assisted pt with pericare and changing socks, then powers up to standing 2nd time, verbal cues for hand placement, assist to shift weight anterior . Pt able to perform step pivot to recliner, mod A to steady and assist to manage RW. Pt c/o headache with HR 94, SpO2 98% and BP 118/95 recorded once in recliner chair, all needs in reach.   If plan is discharge home, recommend the following: Help with stairs or ramp for entrance;Assistance with cooking/housework;Assist for transportation;A lot of help with walking and/or transfers;Two people to help with bathing/dressing/bathroom   Can travel by private vehicle        Equipment Recommendations  Other (comment) (defer to SNF)    Recommendations for Other Services       Precautions / Restrictions Precautions Precautions: Fall Restrictions Weight Bearing Restrictions: No     Mobility  Bed Mobility Overal bed mobility: Needs Assistance Bed Mobility: Supine to Sit     Supine to sit: Mod assist, HOB elevated, Used rails     General bed mobility comments: increased time and effort, heavy use of  bedrail and elevated HOB with mod A to transition to sitting EOB    Transfers Overall transfer level: Needs assistance Equipment used: Rolling walker (2 wheels) Transfers: Sit to/from Stand, Bed to chair/wheelchair/BSC Sit to Stand: Mod assist   Step pivot transfers: Mod assist       General transfer comment: mod A to power up to standing at EOB, verbal cues for hand placement to assist in rising, cues to shift weight anterior; mod A for step pivot to recliner, verbal cues for RW management for sequencing    Ambulation/Gait               General Gait Details: limited by urinary incontience   Stairs             Wheelchair Mobility     Tilt Bed    Modified Rankin (Stroke Patients Only)       Balance Overall balance assessment: History of Falls, Needs assistance Sitting-balance support: No upper extremity supported, Feet unsupported Sitting balance-Leahy Scale: Fair     Standing balance support: Reliant on assistive device for balance, During functional activity, Bilateral upper extremity supported Standing balance-Leahy Scale: Poor                              Cognition Arousal: Alert Behavior During Therapy: WFL for tasks assessed/performed Overall Cognitive Status: Within Functional Limits for tasks assessed  Exercises      General Comments        Pertinent Vitals/Pain Pain Assessment Pain Assessment: Faces Faces Pain Scale: Hurts a little bit Pain Location: headache Pain Descriptors / Indicators: Aching Pain Intervention(s): Limited activity within patient's tolerance, Monitored during session    Home Living                          Prior Function            PT Goals (current goals can now be found in the care plan section) Acute Rehab PT Goals Patient Stated Goal: pt not sure PT Goal Formulation: With patient Time For Goal Achievement:  09/23/23 Potential to Achieve Goals: Fair Progress towards PT goals: Progressing toward goals    Frequency    Min 1X/week      PT Plan      Co-evaluation              AM-PAC PT "6 Clicks" Mobility   Outcome Measure  Help needed turning from your back to your side while in a flat bed without using bedrails?: A Lot Help needed moving from lying on your back to sitting on the side of a flat bed without using bedrails?: A Lot Help needed moving to and from a bed to a chair (including a wheelchair)?: A Lot Help needed standing up from a chair using your arms (e.g., wheelchair or bedside chair)?: A Lot Help needed to walk in hospital room?: Total Help needed climbing 3-5 steps with a railing? : Total 6 Click Score: 10    End of Session Equipment Utilized During Treatment: Gait belt Activity Tolerance: Patient tolerated treatment well Patient left: in chair;with call bell/phone within reach;with chair alarm set Nurse Communication: Mobility status;Other (comment) (headache, purewick) PT Visit Diagnosis: Unsteadiness on feet (R26.81);History of falling (Z91.81)     Time: 6222-9798 PT Time Calculation (min) (ACUTE ONLY): 26 min  Charges:    $Therapeutic Activity: 23-37 mins PT General Charges $$ ACUTE PT VISIT: 1 Visit                     Tori Versie Soave PT, DPT 09/11/23, 1:04 PM

## 2023-09-11 NOTE — TOC Progression Note (Signed)
Transition of Care [facility]) - Progression Note    Patient Details  Name: [name] MRN: [id number] Date of Birth: 1956/03/11  [facility] of [facility]) CM/SW Contact  Amada Jupiter, LCSW Phone Number: 09/11/2023, 2:51 PM  Clinical Narrative:    Have reviewed SNF bed offers with pt and son, Fredrik Cove, and bed accepted at [facility] who can admit pt on Monday.  Will begin insurance authorization.  MD aware.   Expected Discharge Plan: Skilled Nursing Facility Barriers to Discharge: English as a second language teacher, SNF Pending bed offer  Expected Discharge Plan and Services In-house Referral: Clinical Social Work   Post Acute Care Choice: Skilled Nursing Facility Living arrangements for the past 2 months: Single Family Home                                       Social Determinants of Health (SDOH) Interventions SDOH Screenings   Food Insecurity: No Food Insecurity (09/09/2023)  Housing: Low Risk  (09/09/2023)  Transportation Needs: No Transportation Needs (09/09/2023)  Utilities: Not At Risk (09/09/2023)  Tobacco Use: Low Risk  (09/09/2023)    Readmission Risk Interventions    09/10/2023    3:11 PM  Readmission Risk Prevention Plan  Transportation Screening Complete  PCP or Specialist Appt within 5-7 Days Complete  Home Care Screening Complete  Medication Review (RN CM) Complete

## 2023-09-11 NOTE — Progress Notes (Signed)
[facility]                                                                              [name], is a 67 y.o. female, DOB - [birth year], AOZ:[id number] Admit date - 09/08/2023    Outpatient Primary MD for the patient is [name], MD  LOS - 2  days  Chief Complaint  Patient presents with   Fatigue       Brief summary   Patient is a 67 year old female with metastatic colon cancer to liver, who has not initiated hospice care yet, chronic blood loss anemia requiring transfusions, prior CVA, left nephrectomy secondary to renal cell CA presented with generalized weakness, lethargy.  Patient was recently [facility] to [name]) on 10/17, had returned home 2 days prior.  Per family since she had returned home she had been mostly sleeping in bed and barely wakes up to have conversation.  At baseline was able to ambulate with a walker.  Family also reported coughing, no labored respiration, abdominal distention, no abdominal pain. In ED, afebrile, HR 116, BP 102/63, on room air Mildly elevated creatinine 1.9 from prior 1.72, alkaline phosphatase 605, chronic. Leukocytosis 20 5K, hemoglobin 7.6 last transfusion was on 10/10  Assessment & Plan    Principal Problem:   Acute metabolic encephalopathy -Patient was noted to be more lethargic, bedbound in the past 2 days at home -Currently alert and oriented, appears close to her baseline. -Chest x-ray showed low lung volumes with right basilar worsening airspace opacity concerning for pneumonia. Leukocytosis 25.1 on admission, improving-> 14.1 today -Urine culture showed Klebsiella, on IV Rocephin  -Continue IV Rocephin, Zithromax -Ammonia level 56-> received lactulose enema -Alert and oriented, no acute complaints  Active Problems: Klebsiella pneumonia -Urine culture showed more than 100,000 colonies of Klebsiella pneumonia -Continue IV Rocephin   Right basilar pneumonia -Continue IV Rocephin, Zithromax      Acute kidney injury superimposed on chronic kidney disease (HCC) stage IV -History of left nephrectomy from renal cell carcinoma -Elevated creatinine of 1.92 from prior of 1.7 -Creatinine improving, 1.3    Metastatic colon cancer to liver [facility]) -Follows Dr. Mosetta Putt, last seen on 08/06/2023.  Per office note, patient has repeatedly declined systemic therapy, continue supportive care and transition to hospice when she is ready -Palliative medicine consult    Chronic blood loss anemia -Hemoglobin 7.6 on admission, was 7.9 on 08/18/2023, receives outpatient transfusions - received 1 unit packed RBC on 11/5 -H&H stable    HTN (hypertension) -BP currently stable    HLD (hyperlipidemia) -Hold statin  Mild hyponatremia Sodium 130 Asymptomatic, obtain serum osmolality, urine osmolality, UNA, TSH   Obesity Estimated body mass index is 34.39 kg/m as calculated from the following:   Height as of this encounter: 5\' 1"  (1.549 m).   Weight as of this encounter: 82.6 kg.  Code Status: DNR DVT Prophylaxis:  enoxaparin (LOVENOX) injection 40 mg Start: 09/10/23 1000   Level of Care: Level of care: Med-Surg Family Communication: Updated patient Disposition Plan:      Remains inpatient appropriate: PT recommended SNF   Procedures:  None  Consultants:   Palliative  medicine  Antimicrobials:   Anti-infectives (From admission, onward)    Start     Dose/Rate Route Frequency Ordered Stop   09/11/23 2200  azithromycin (ZITHROMAX) tablet 500 mg        500 mg Oral Daily at bedtime 09/11/23 1209     09/09/23 0000  cefTRIAXone (ROCEPHIN) 1 g in sodium chloride 0.9 % 100 mL IVPB        1 g 200 mL/hr over 30 Minutes Intravenous Every 24 hours 09/08/23 2309     09/09/23 0000  azithromycin (ZITHROMAX) 500 mg in dextrose 5 % 250 mL IVPB  Status:  Discontinued        500 mg 250 mL/hr over 60 Minutes Intravenous Every 24 hours 09/08/23 2309 09/11/23 1209          Medications  allopurinol  300  mg Oral Daily   amLODipine  5 mg Oral Daily   azithromycin  500 mg Oral QHS   enoxaparin (LOVENOX) injection  40 mg Subcutaneous Q24H   metoprolol tartrate  25 mg Oral BID   polyethylene glycol  17 g Oral Daily   pravastatin  20 mg Oral q1800      Subjective:   [name] was seen and examined today.  Alert and oriented, no acute complaints, tolerating breakfast without any difficulty.  Overall improving awaiting SNF  Objective:   Vitals:   09/10/23 2121 09/11/23 0428 09/11/23 0900 09/11/23 1200  BP: (!) 149/95 (!) 151/89 (!) 173/108 (!) 153/93  Pulse: 95 87 (!) 101 98  Resp: 18 18 18 18   Temp: 98 F (36.7 C) 97.6 F (36.4 C)  (!) 97.5 F (36.4 C)  TempSrc: Oral Oral  Oral  SpO2: 98% 97% 99% 94%  Weight:      Height:        Intake/Output Summary (Last 24 hours) at 09/11/2023 1401 Last data filed at 09/11/2023 1304 Gross per 24 hour  Intake 1070 ml  Output 1400 ml  Net -330 ml     Wt Readings from Last 3 Encounters:  09/08/23 82.6 kg  08/18/23 88 kg  08/15/23 88.9 kg   Physical Exam General: Alert and oriented x 3, NAD Cardiovascular: S1 S2 clear, RRR.  Respiratory: CTAB, no wheezing Gastrointestinal: Soft, nontender, nondistended, NBS Ext: no pedal edema bilaterally Neuro: no new deficits Psych: Normal affect    Data Reviewed:  I have personally reviewed following labs    CBC Lab Results  Component Value Date   WBC 14.1 (H) 09/11/2023   RBC 3.03 (L) 09/11/2023   HGB 8.8 (L) 09/11/2023   HCT 29.4 (L) 09/11/2023   MCV 97.0 09/11/2023   MCH 29.0 09/11/2023   PLT 285 09/11/2023   MCHC 29.9 (L) 09/11/2023   RDW 20.7 (H) 09/11/2023   LYMPHSABS 1.8 09/08/2023   MONOABS 1.1 (H) 09/08/2023   EOSABS 0.0 09/08/2023   BASOSABS 0.0 09/08/2023     Last metabolic panel Lab Results  Component Value Date   NA 130 (L) 09/11/2023   K 4.0 09/11/2023   CL 98 09/11/2023   CO2 20 (L) 09/11/2023   BUN 19 09/11/2023   CREATININE 1.37 (H) 09/11/2023    GLUCOSE 80 09/11/2023   GFRNONAA 42 (L) 09/11/2023   GFRAA 27 (L) 07/18/2019   CALCIUM 8.0 (L) 09/11/2023   PHOS 3.4 05/16/2019   PROT 6.0 (L) 09/09/2023   ALBUMIN 1.8 (L) 09/09/2023   BILITOT 3.1 (H) 09/09/2023   ALKPHOS 543 (H) 09/09/2023   AST 48 (  H) 09/09/2023   ALT 28 09/09/2023   ANIONGAP 12 09/11/2023    CBG (last 3)  No results for input(s): "GLUCAP" in the last 72 hours.    Coagulation Profile: No results for input(s): "INR", "PROTIME" in the last 168 hours.   Radiology Studies: I have personally reviewed the imaging studies  No results found.     Thad Ranger M.D. [facility] 09/11/2023, 2:01 PM  Available via Epic secure chat 7am-7pm After 7 pm, please refer to night coverage provider listed on amion.

## 2023-09-12 DIAGNOSIS — R531 Weakness: Secondary | ICD-10-CM | POA: Diagnosis not present

## 2023-09-12 DIAGNOSIS — G9341 Metabolic encephalopathy: Secondary | ICD-10-CM | POA: Diagnosis not present

## 2023-09-12 DIAGNOSIS — D649 Anemia, unspecified: Secondary | ICD-10-CM | POA: Diagnosis not present

## 2023-09-12 DIAGNOSIS — C189 Malignant neoplasm of colon, unspecified: Secondary | ICD-10-CM | POA: Diagnosis not present

## 2023-09-12 DIAGNOSIS — E871 Hypo-osmolality and hyponatremia: Secondary | ICD-10-CM | POA: Diagnosis present

## 2023-09-12 LAB — BASIC METABOLIC PANEL
Anion gap: 13 (ref 5–15)
BUN: 19 mg/dL (ref 8–23)
CO2: 19 mmol/L — ABNORMAL LOW (ref 22–32)
Calcium: 8 mg/dL — ABNORMAL LOW (ref 8.9–10.3)
Chloride: 91 mmol/L — ABNORMAL LOW (ref 98–111)
Creatinine, Ser: 1.44 mg/dL — ABNORMAL HIGH (ref 0.44–1.00)
GFR, Estimated: 39 mL/min — ABNORMAL LOW (ref >60)
Glucose, Bld: 66 mg/dL — ABNORMAL LOW (ref 70–99)
Potassium: 4.2 mmol/L (ref 3.5–5.1)
Sodium: 123 mmol/L — ABNORMAL LOW (ref 135–145)

## 2023-09-12 LAB — CBC
HCT: 26.7 % — ABNORMAL LOW (ref 36.0–46.0)
Hemoglobin: 8.3 g/dL — ABNORMAL LOW (ref 12.0–15.0)
MCH: 28.9 pg (ref 26.0–34.0)
MCHC: 31.1 g/dL (ref 30.0–36.0)
MCV: 93 fL (ref 80.0–100.0)
Platelets: 286 10*3/uL (ref 150–400)
RBC: 2.87 MIL/uL — ABNORMAL LOW (ref 3.87–5.11)
RDW: 19.9 % — ABNORMAL HIGH (ref 11.5–15.5)
WBC: 14 10*3/uL — ABNORMAL HIGH (ref 4.0–10.5)
nRBC: 0.4 % — ABNORMAL HIGH (ref 0.0–0.2)

## 2023-09-12 MED ORDER — KETOROLAC TROMETHAMINE 15 MG/ML IJ SOLN
15.0000 mg | Freq: Once | INTRAMUSCULAR | Status: AC
  Administered 2023-09-12: 15 mg via INTRAVENOUS
  Filled 2023-09-12: qty 1

## 2023-09-12 MED ORDER — SODIUM CHLORIDE 0.9 % IV SOLN: INTRAVENOUS | Status: AC

## 2023-09-12 MED ORDER — PROCHLORPERAZINE EDISYLATE 10 MG/2ML IJ SOLN
10.0000 mg | Freq: Once | INTRAMUSCULAR | Status: AC
  Administered 2023-09-12: 10 mg via INTRAVENOUS
  Filled 2023-09-12: qty 2

## 2023-09-12 MED ORDER — SODIUM CHLORIDE 1 G PO TABS
1.0000 g | ORAL_TABLET | Freq: Three times a day (TID) | ORAL | Status: DC
  Administered 2023-09-12 – 2023-09-14 (×7): 1 g via ORAL
  Filled 2023-09-12 (×8): qty 1

## 2023-09-12 NOTE — Progress Notes (Signed)
Mobility Specialist - Progress Note   09/12/23 1104  Mobility  Activity Transferred from bed to chair  Level of Assistance Contact guard assist, steadying assist  Assistive Device Front wheel walker  Distance Ambulated (ft) 2 ft  Activity Response Tolerated well  Mobility Referral Yes  $Mobility charge 1 Mobility  Mobility Specialist Start Time (ACUTE ONLY) 0935  Mobility Specialist Stop Time (ACUTE ONLY) 0945  Mobility Specialist Time Calculation (min) (ACUTE ONLY) 10 min   Pt received in bed and agreeable to transfer to recliner. Pt was modA from supine>sitting and standing. No complaints during transfer. Pt to recliner after session with all needs met. Chair alarm on.  [facility]

## 2023-09-12 NOTE — Progress Notes (Signed)
[facility]                                                                              [name], is a 67 y.o. female, DOB - Feb 03, 1956, ZOX:[id number] Admit date - 09/08/2023    Outpatient Primary MD for the patient is [name], MD  LOS - 3  days  Chief Complaint  Patient presents with   Fatigue       Brief summary   Patient is a 67 year old female with metastatic colon cancer to liver, who has not initiated hospice care yet, chronic blood loss anemia requiring transfusions, prior CVA, left nephrectomy secondary to renal cell CA presented with generalized weakness, lethargy.  Patient was recently [facility] to [name] on [facility], had returned home 2 days prior.  Per family since she had returned home she had been mostly sleeping in bed and barely wakes up to have conversation.  At baseline was able to ambulate with a walker.  Family also reported coughing, no labored respiration, abdominal distention, no abdominal pain. In ED, afebrile, HR 116, BP 102/63, on room air Mildly elevated creatinine 1.9 from prior 1.72, alkaline phosphatase 605, chronic. Leukocytosis 20 5K, hemoglobin 7.6 last transfusion was on 10/10  Assessment & Plan    Principal Problem:   Acute metabolic encephalopathy -Patient was noted to be more lethargic, bedbound in the past 2 days at home -Currently alert and oriented, appears close to her baseline. -Chest x-ray showed low lung volumes with right basilar worsening airspace opacity concerning for pneumonia. Leukocytosis 25.1 on admission, improving -Urine culture showed Klebsiella, on IV Rocephin  -Continue IV Rocephin, Zithromax -Ammonia level 56-> received lactulose enema -Ackley stable, alert and oriented  Active Problems: Klebsiella pneumonia -Urine culture showed Klebsiella pneumonia, sensitive to ceftriaxone,  -continue IV Rocephin    Right basilar pneumonia -Continue IV Rocephin, Zithromax     Acute kidney injury  superimposed on chronic kidney disease (HCC) stage IV -History of left nephrectomy from renal cell carcinoma -Elevated creatinine of 1.92 from prior of 1.7 -Creatinine 1.4  Hyponatremia -Sodium trending down, 135 on 11/6, 123 today -Serum osmolarity 267, urine osmolality 116, urine sodium 16.  TSH 3.9 -Will place on normal saline IV fluids, salt tabs 1g TID     Metastatic colon cancer to liver [facility]) -Follows Dr. Mosetta Putt, last seen on 08/06/2023.  Per office note, patient has repeatedly declined systemic therapy, continue supportive care and transition to hospice when she is ready -Evaluated by palliative medicine    Chronic blood loss anemia -Hemoglobin 7.6 on admission, was 7.9 on 08/18/2023, receives outpatient transfusions - received 1 unit packed RBC on 11/5 -H&H stable    HTN (hypertension) -BP currently stable    HLD (hyperlipidemia) -Hold statin    Obesity Estimated body mass index is 34.39 kg/m as calculated from the following:   Height as of this encounter: 5\' 1"  (1.549 m).   Weight as of this encounter: 82.6 kg.  Code Status: DNR DVT Prophylaxis:  enoxaparin (LOVENOX) injection 40 mg Start: 09/10/23 1000   Level of Care: Level of care: Med-Surg Family Communication: Updated patient Disposition Plan:  Remains inpatient appropriate: PT recommended SNF, likely Monday   Procedures:  None  Consultants:   Palliative medicine  Antimicrobials:   Anti-infectives (From admission, onward)    Start     Dose/Rate Route Frequency Ordered Stop   09/11/23 2200  azithromycin (ZITHROMAX) tablet 500 mg        500 mg Oral Daily at bedtime 09/11/23 1209     09/09/23 0000  cefTRIAXone (ROCEPHIN) 1 g in sodium chloride 0.9 % 100 mL IVPB        1 g 200 mL/hr over 30 Minutes Intravenous Every 24 hours 09/08/23 2309     09/09/23 0000  azithromycin (ZITHROMAX) 500 mg in dextrose 5 % 250 mL IVPB  Status:  Discontinued        500 mg 250 mL/hr over 60 Minutes Intravenous Every  24 hours 09/08/23 2309 09/11/23 1209          Medications  allopurinol  300 mg Oral Daily   amLODipine  5 mg Oral Daily   azithromycin  500 mg Oral QHS   enoxaparin (LOVENOX) injection  40 mg Subcutaneous Q24H   metoprolol tartrate  25 mg Oral BID   polyethylene glycol  17 g Oral Daily   pravastatin  20 mg Oral q1800      Subjective:   [name] was seen and examined today.  Tolerating diet, no acute nausea vomiting, abdominal pain or diarrhea.  Overall feeling better  Objective:   Vitals:   09/11/23 0900 09/11/23 1200 09/11/23 2110 09/12/23 0659  BP: (!) 173/108 (!) 153/93 131/88 119/81  Pulse: (!) 101 98 76 78  Resp: 18 18 18 14   Temp:  (!) 97.5 F (36.4 C) 98 F (36.7 C) (!) 97.4 F (36.3 C)  TempSrc:  Oral  Oral  SpO2: 99% 94% 98% 98%  Weight:      Height:        Intake/Output Summary (Last 24 hours) at 09/12/2023 1242 Last data filed at 09/12/2023 0908 Gross per 24 hour  Intake 1600 ml  Output 2050 ml  Net -450 ml     Wt Readings from Last 3 Encounters:  09/08/23 82.6 kg  08/18/23 88 kg  08/15/23 88.9 kg   Physical Exam General: Alert and oriented x 3, NAD Cardiovascular: S1 S2 clear, RRR.  Respiratory: CTAB Gastrointestinal: Soft, NT, ND, NBS Ext: no pedal edema bilaterally Neuro: no new deficits Psych: Normal affect  Data Reviewed:  I have personally reviewed following labs    CBC Lab Results  Component Value Date   WBC 14.0 (H) 09/12/2023   RBC 2.87 (L) 09/12/2023   HGB 8.3 (L) 09/12/2023   HCT 26.7 (L) 09/12/2023   MCV 93.0 09/12/2023   MCH 28.9 09/12/2023   PLT 286 09/12/2023   MCHC 31.1 09/12/2023   RDW 19.9 (H) 09/12/2023   LYMPHSABS 1.8 09/08/2023   MONOABS 1.1 (H) 09/08/2023   EOSABS 0.0 09/08/2023   BASOSABS 0.0 09/08/2023     Last metabolic panel Lab Results  Component Value Date   NA 123 (L) 09/12/2023   K 4.2 09/12/2023   CL 91 (L) 09/12/2023   CO2 19 (L) 09/12/2023   BUN 19 09/12/2023   CREATININE 1.44  (H) 09/12/2023   GLUCOSE 66 (L) 09/12/2023   GFRNONAA 39 (L) 09/12/2023   GFRAA 27 (L) 07/18/2019   CALCIUM 8.0 (L) 09/12/2023   PHOS 3.4 05/16/2019   PROT 6.0 (L) 09/09/2023   ALBUMIN 1.8 (L) 09/09/2023   BILITOT 3.1 (  H) 09/09/2023   ALKPHOS 543 (H) 09/09/2023   AST 48 (H) 09/09/2023   ALT 28 09/09/2023   ANIONGAP 13 09/12/2023    CBG (last 3)  No results for input(s): "GLUCAP" in the last 72 hours.    Coagulation Profile: No results for input(s): "INR", "PROTIME" in the last 168 hours.   Radiology Studies: I have personally reviewed the imaging studies  No results found.     Thad Ranger M.D. [facility] 09/12/2023, 12:42 PM  Available via Epic secure chat 7am-7pm After 7 pm, please refer to night coverage provider listed on amion.

## 2023-09-12 NOTE — Plan of Care (Signed)
  Problem: Education: Goal: Knowledge of General Education information will improve Description: Including pain rating scale, medication(s)/side effects and non-pharmacologic comfort measures Outcome: Progressing   Problem: Clinical Measurements: Goal: Ability to maintain clinical measurements within normal limits will improve Outcome: Progressing   

## 2023-09-13 DIAGNOSIS — D649 Anemia, unspecified: Secondary | ICD-10-CM | POA: Diagnosis not present

## 2023-09-13 DIAGNOSIS — C189 Malignant neoplasm of colon, unspecified: Secondary | ICD-10-CM | POA: Diagnosis not present

## 2023-09-13 DIAGNOSIS — G9341 Metabolic encephalopathy: Secondary | ICD-10-CM | POA: Diagnosis not present

## 2023-09-13 DIAGNOSIS — J189 Pneumonia, unspecified organism: Secondary | ICD-10-CM | POA: Diagnosis present

## 2023-09-13 DIAGNOSIS — R531 Weakness: Secondary | ICD-10-CM | POA: Diagnosis not present

## 2023-09-13 LAB — CBC
HCT: 27 % — ABNORMAL LOW (ref 36.0–46.0)
Hemoglobin: 8.2 g/dL — ABNORMAL LOW (ref 12.0–15.0)
MCH: 28.9 pg (ref 26.0–34.0)
MCHC: 30.4 g/dL (ref 30.0–36.0)
MCV: 95.1 fL (ref 80.0–100.0)
Platelets: 260 10*3/uL (ref 150–400)
RBC: 2.84 MIL/uL — ABNORMAL LOW (ref 3.87–5.11)
RDW: 20.1 % — ABNORMAL HIGH (ref 11.5–15.5)
WBC: 13.4 10*3/uL — ABNORMAL HIGH (ref 4.0–10.5)
nRBC: 0.1 % (ref 0.0–0.2)

## 2023-09-13 LAB — BASIC METABOLIC PANEL
Anion gap: 11 (ref 5–15)
BUN: 20 mg/dL (ref 8–23)
CO2: 19 mmol/L — ABNORMAL LOW (ref 22–32)
Calcium: 7.8 mg/dL — ABNORMAL LOW (ref 8.9–10.3)
Chloride: 99 mmol/L (ref 98–111)
Creatinine, Ser: 1.21 mg/dL — ABNORMAL HIGH (ref 0.44–1.00)
GFR, Estimated: 49 mL/min — ABNORMAL LOW (ref >60)
Glucose, Bld: 84 mg/dL (ref 70–99)
Potassium: 4.1 mmol/L (ref 3.5–5.1)
Sodium: 129 mmol/L — ABNORMAL LOW (ref 135–145)

## 2023-09-13 MED ORDER — TRAZODONE HCL 50 MG PO TABS
50.0000 mg | ORAL_TABLET | Freq: Every evening | ORAL | Status: DC | PRN
  Administered 2023-09-14: 50 mg via ORAL
  Filled 2023-09-13: qty 1

## 2023-09-13 MED ORDER — MELATONIN 5 MG PO TABS
5.0000 mg | ORAL_TABLET | Freq: Every evening | ORAL | Status: DC | PRN
  Administered 2023-09-13: 5 mg via ORAL
  Filled 2023-09-13: qty 1

## 2023-09-13 MED ORDER — GUAIFENESIN-DM 100-10 MG/5ML PO SYRP
5.0000 mL | ORAL_SOLUTION | ORAL | Status: DC | PRN
  Filled 2023-09-13: qty 10

## 2023-09-13 NOTE — Plan of Care (Signed)
?  Problem: Education: Goal: Knowledge of General Education information will improve Description: Including pain rating scale, medication(s)/side effects and non-pharmacologic comfort measures Outcome: Progressing   Problem: Health Behavior/Discharge Planning: Goal: Ability to manage health-related needs will improve Outcome: Progressing   Problem: Clinical Measurements: Goal: Ability to maintain clinical measurements within normal limits will improve Outcome: Progressing Goal: Will remain free from infection Outcome: Progressing Goal: Diagnostic test results will improve Outcome: Progressing Goal: Respiratory complications will improve Outcome: Progressing Goal: Cardiovascular complication will be avoided Outcome: Progressing   Problem: Clinical Measurements: Goal: Will remain free from infection Outcome: Progressing   Problem: Clinical Measurements: Goal: Diagnostic test results will improve Outcome: Progressing   

## 2023-09-13 NOTE — Plan of Care (Signed)
  Problem: Clinical Measurements: Goal: Respiratory complications will improve Outcome: Progressing   Problem: Clinical Measurements: Goal: Diagnostic test results will improve Outcome: Progressing   Problem: Health Behavior/Discharge Planning: Goal: Ability to manage health-related needs will improve Outcome: Progressing   Problem: Education: Goal: Knowledge of General Education information will improve Description: Including pain rating scale, medication(s)/side effects and non-pharmacologic comfort measures Outcome: Progressing

## 2023-09-13 NOTE — Plan of Care (Signed)

## 2023-09-13 NOTE — Progress Notes (Signed)
[facility]                                                                              [name], is a 67 y.o. female, DOB - 01/05/1956, ZOX:[id number] Admit date - 09/08/2023    Outpatient Primary MD for the patient is [name], MD  LOS - 4  days  Chief Complaint  Patient presents with   Fatigue       Brief summary   Patient is a 67 year old female with metastatic colon cancer to liver, who has not initiated hospice care yet, chronic blood loss anemia requiring transfusions, prior CVA, left nephrectomy secondary to renal cell CA presented with generalized weakness, lethargy.  Patient was [facility] to [name] on [facility], had returned home 2 days prior.  Per family since she had returned home she had been mostly sleeping in bed and barely wakes up to have conversation.  At baseline was able to ambulate with a walker.  Family also reported coughing, no labored respiration, abdominal distention, no abdominal pain. In ED, afebrile, HR 116, BP 102/63, on room air Mildly elevated creatinine 1.9 from prior 1.72, alkaline phosphatase 605, chronic. Leukocytosis 20 5K, hemoglobin 7.6 last transfusion was on 10/10  Assessment & Plan    Principal Problem:   Acute metabolic encephalopathy -Patient was noted to be more lethargic, bedbound in the past 2 days at home before admission -Chest x-ray showed low lung volumes with right basilar worsening airspace opacity concerning for pneumonia. Leukocytosis 25.1 on admission, improving to 13.4 today -Urine culture showed Klebsiella, on IV Rocephin  -Continue IV Rocephin, Zithromax -Ammonia level 56-> received lactulose enema -Stable, alert and oriented x 3, appears at baseline  Active Problems: Klebsiella pneumonia -Urine culture showed Klebsiella pneumonia, sensitive to ceftriaxone,  -continue IV Rocephin   Right basilar pneumonia -Continue IV Rocephin, Zithromax    Acute kidney injury superimposed on  chronic kidney disease (HCC) stage IV -History of left nephrectomy from renal cell carcinoma -Elevated creatinine of 1.92 from prior of 1.7 -Improving, creatinine 1.2  Hyponatremia -Sodium trending down, 135 on 11/6, 123 on 11/9 -Serum osmolarity 267, urine osmolality 116, urine sodium 16.  TSH 3.9 -Received IV fluid hydration, salt tabs 1g TID  -Sodium improving, 129 today    Metastatic colon cancer to liver [facility]) -Follows Dr. Mosetta Putt, last seen on 08/06/2023.  Per office note, patient has repeatedly declined systemic therapy, continue supportive care and transition to hospice when she is ready -Evaluated by palliative medicine    Chronic blood loss anemia -Hemoglobin 7.6 on admission, was 7.9 on 08/18/2023, receives outpatient transfusions - received 1 unit packed RBC on 11/5 -H&H stable    HTN (hypertension) -BP currently stable    HLD (hyperlipidemia) -Hold statin    Obesity Estimated body mass index is 34.39 kg/m as calculated from the following:   Height as of this encounter: 5\' 1"  (1.549 m).   Weight as of this encounter: 82.6 kg.  Code Status: DNR DVT Prophylaxis:  enoxaparin (LOVENOX) injection 40 mg Start: 09/10/23 1000   Level of Care: Level of care: Med-Surg Family Communication: Updated patient Disposition Plan:  Remains inpatient appropriate: Likely DC to SNF in a.m.   Procedures:  None  Consultants:   Palliative medicine  Antimicrobials:   Anti-infectives (From admission, onward)    Start     Dose/Rate Route Frequency Ordered Stop   09/11/23 2200  azithromycin (ZITHROMAX) tablet 500 mg        500 mg Oral Daily at bedtime 09/11/23 1209     09/09/23 0000  cefTRIAXone (ROCEPHIN) 1 g in sodium chloride 0.9 % 100 mL IVPB        1 g 200 mL/hr over 30 Minutes Intravenous Every 24 hours 09/08/23 2309     09/09/23 0000  azithromycin (ZITHROMAX) 500 mg in dextrose 5 % 250 mL IVPB  Status:  Discontinued        500 mg 250 mL/hr over 60 Minutes  Intravenous Every 24 hours 09/08/23 2309 09/11/23 1209          Medications  allopurinol  300 mg Oral Daily   amLODipine  5 mg Oral Daily   azithromycin  500 mg Oral QHS   enoxaparin (LOVENOX) injection  40 mg Subcutaneous Q24H   metoprolol tartrate  25 mg Oral BID   polyethylene glycol  17 g Oral Daily   pravastatin  20 mg Oral q1800   sodium chloride  1 g Oral TID WC      Subjective:   [name] was seen and examined today.  No acute complaints, sitting up, no nausea vomiting abdominal pain or any diarrhea.  Overall feeling better.   Objective:   Vitals:   09/12/23 1317 09/12/23 2113 09/13/23 0545 09/13/23 0851  BP: 119/79 124/79 136/77 136/77  Pulse: 73 75 71 71  Resp: 16 16 16    Temp: (!) 97.4 F (36.3 C) 97.7 F (36.5 C) 97.7 F (36.5 C)   TempSrc: Oral Oral Oral   SpO2: 97% 98% 99%   Weight:      Height:        Intake/Output Summary (Last 24 hours) at 09/13/2023 1259 Last data filed at 09/13/2023 1243 Gross per 24 hour  Intake 2510 ml  Output 3000 ml  Net -490 ml     Wt Readings from Last 3 Encounters:  09/08/23 82.6 kg  08/18/23 88 kg  08/15/23 88.9 kg   Physical Exam General: Alert and oriented x 3, NAD Cardiovascular: S1 S2 clear, RRR.  Respiratory: CTAB, no wheezing Gastrointestinal: Soft, nontender, nondistended, NBS Ext: no pedal edema bilaterally Neuro: no new deficits Psych: Normal affect   Data Reviewed:  I have personally reviewed following labs    CBC Lab Results  Component Value Date   WBC 13.4 (H) 09/13/2023   RBC 2.84 (L) 09/13/2023   HGB 8.2 (L) 09/13/2023   HCT 27.0 (L) 09/13/2023   MCV 95.1 09/13/2023   MCH 28.9 09/13/2023   PLT 260 09/13/2023   MCHC 30.4 09/13/2023   RDW 20.1 (H) 09/13/2023   LYMPHSABS 1.8 09/08/2023   MONOABS 1.1 (H) 09/08/2023   EOSABS 0.0 09/08/2023   BASOSABS 0.0 09/08/2023     Last metabolic panel Lab Results  Component Value Date   NA 129 (L) 09/13/2023   K 4.1 09/13/2023   CL  99 09/13/2023   CO2 19 (L) 09/13/2023   BUN 20 09/13/2023   CREATININE 1.21 (H) 09/13/2023   GLUCOSE 84 09/13/2023   GFRNONAA 49 (L) 09/13/2023   GFRAA 27 (L) 07/18/2019   CALCIUM 7.8 (L) 09/13/2023   PHOS 3.4 05/16/2019   PROT 6.0 (  L) 09/09/2023   ALBUMIN 1.8 (L) 09/09/2023   BILITOT 3.1 (H) 09/09/2023   ALKPHOS 543 (H) 09/09/2023   AST 48 (H) 09/09/2023   ALT 28 09/09/2023   ANIONGAP 11 09/13/2023    CBG (last 3)  No results for input(s): "GLUCAP" in the last 72 hours.    Coagulation Profile: No results for input(s): "INR", "PROTIME" in the last 168 hours.   Radiology Studies: I have personally reviewed the imaging studies  No results found.     Thad Ranger M.D. [facility] 09/13/2023, 12:59 PM  Available via Epic secure chat 7am-7pm After 7 pm, please refer to night coverage provider listed on amion.

## 2023-09-14 DIAGNOSIS — C189 Malignant neoplasm of colon, unspecified: Secondary | ICD-10-CM | POA: Diagnosis not present

## 2023-09-14 DIAGNOSIS — G9341 Metabolic encephalopathy: Secondary | ICD-10-CM | POA: Diagnosis not present

## 2023-09-14 DIAGNOSIS — D649 Anemia, unspecified: Secondary | ICD-10-CM | POA: Diagnosis not present

## 2023-09-14 DIAGNOSIS — R531 Weakness: Secondary | ICD-10-CM | POA: Diagnosis not present

## 2023-09-14 LAB — CBC
HCT: 25 % — ABNORMAL LOW (ref 36.0–46.0)
Hemoglobin: 7.5 g/dL — ABNORMAL LOW (ref 12.0–15.0)
MCH: 29.4 pg (ref 26.0–34.0)
MCHC: 30 g/dL (ref 30.0–36.0)
MCV: 98 fL (ref 80.0–100.0)
Platelets: 235 10*3/uL (ref 150–400)
RBC: 2.55 MIL/uL — ABNORMAL LOW (ref 3.87–5.11)
RDW: 20.9 % — ABNORMAL HIGH (ref 11.5–15.5)
WBC: 11.9 10*3/uL — ABNORMAL HIGH (ref 4.0–10.5)
nRBC: 0.3 % — ABNORMAL HIGH (ref 0.0–0.2)

## 2023-09-14 LAB — CULTURE, BLOOD (ROUTINE X 2)
Culture: NO GROWTH
Culture: NO GROWTH
Special Requests: ADEQUATE
Special Requests: ADEQUATE

## 2023-09-14 LAB — BASIC METABOLIC PANEL
Anion gap: 12 (ref 5–15)
BUN: 17 mg/dL (ref 8–23)
CO2: 19 mmol/L — ABNORMAL LOW (ref 22–32)
Calcium: 7.9 mg/dL — ABNORMAL LOW (ref 8.9–10.3)
Chloride: 99 mmol/L (ref 98–111)
Creatinine, Ser: 1.27 mg/dL — ABNORMAL HIGH (ref 0.44–1.00)
GFR, Estimated: 46 mL/min — ABNORMAL LOW (ref >60)
Glucose, Bld: 78 mg/dL (ref 70–99)
Potassium: 4.2 mmol/L (ref 3.5–5.1)
Sodium: 130 mmol/L — ABNORMAL LOW (ref 135–145)

## 2023-09-14 MED ORDER — GUAIFENESIN-DM 100-10 MG/5ML PO SYRP: 5.0000 mL | ORAL_SOLUTION | ORAL | Status: AC | PRN

## 2023-09-14 MED ORDER — SODIUM CHLORIDE 1 G PO TABS: 1.0000 g | ORAL_TABLET | Freq: Three times a day (TID) | ORAL | Status: AC

## 2023-09-14 MED ORDER — TRAZODONE HCL 50 MG PO TABS: 50.0000 mg | ORAL_TABLET | Freq: Every evening | ORAL | Status: AC | PRN

## 2023-09-14 MED ORDER — POLYETHYLENE GLYCOL 3350 17 G PO PACK: 17.0000 g | PACK | Freq: Every day | ORAL | Status: AC

## 2023-09-14 MED ORDER — ACETAMINOPHEN 325 MG PO TABS: 325.0000 mg | ORAL_TABLET | Freq: Four times a day (QID) | ORAL | Status: AC | PRN

## 2023-09-14 MED ORDER — AZITHROMYCIN 500 MG PO TABS: 500.0000 mg | ORAL_TABLET | Freq: Every day | ORAL | Status: AC

## 2023-09-14 MED ORDER — CEPHALEXIN 500 MG PO CAPS: 500.0000 mg | ORAL_CAPSULE | Freq: Two times a day (BID) | ORAL | Status: AC

## 2023-09-14 NOTE — Progress Notes (Signed)
Patient discharged to [facility], IV removed, discharge paperwork provided and explained, patient verbalized understanding, copy of discharge paperwork provided in packet given to PTAR, RN attempted to call report, receiving facility did not answer, RN proceeded to leave a voice message with her name and number.

## 2023-09-14 NOTE — TOC Transition Note (Signed)
Transition of Care [facility]) - CM/SW Discharge Note   Patient Details  Name: [name] MRN: [id number] Date of Birth: 01-Jan-1956  [facility]) CM/SW Contact:  Adrian Prows, RN Phone Number: 09/14/2023, 11:14 AM   Clinical Narrative:    D/C orders received; pt is going to [place]; spoke w/ Bjorn Loser at [place]; she gave RM # [phone], and call report # [phone]; Bjorn Loser also requests pt arrive this afternoon; Bjorn Loser is ok w/ 1 PM pick up by PTAR;  spoke w/ pt's son Zaydah Sansing [phone]) notified; they agree to d/c plan; d/c summary and SNF transfer report sent via SNF hub; PTAR called at [phone]; spoke w/ Raiford Noble; requested 1 PM pick up; no TOC needs.   Final next level of care: Skilled Nursing Facility Barriers to Discharge: No Barriers Identified   Patient Goals and CMS Choice CMS Medicare.gov Compare Post Acute Care list provided to:: Patient Choice offered to / list presented to : Patient  Discharge Placement                Patient chooses bed at: [facility] Patient to be transferred to facility by: PTAR Name of family member notified: Jackye Mulvey (son) [phone] Patient and family notified of of transfer: 09/14/23  Discharge Plan and Services Additional resources added to the After Visit Summary for   In-house Referral: Clinical Social Work   Post Acute Care Choice: Skilled Nursing Facility                               Social Determinants of Health (SDOH) Interventions SDOH Screenings   Food Insecurity: No Food Insecurity (09/09/2023)  Housing: Low Risk  (09/09/2023)  Transportation Needs: No Transportation Needs (09/09/2023)  Utilities: Not At Risk (09/09/2023)  Tobacco Use: Low Risk  (09/09/2023)     Readmission Risk Interventions    09/10/2023    3:11 PM  Readmission Risk Prevention Plan  Transportation Screening Complete  PCP or Specialist Appt within 5-7 Days Complete  Home Care Screening Complete   Medication Review (RN CM) Complete

## 2023-09-14 NOTE — Care Management Important Message (Signed)
Important Message  Patient Details  Name: [name] MRN: [id number] Date of Birth: 06-Oct-1956   Important Message Given:        Mardene Sayer 09/14/2023, 11:55 AM S/W  PT"S WILL MAIL IM TO HER SON  ROGER

## 2023-09-14 NOTE — Progress Notes (Signed)
   09/14/23 1400  Spiritual Encounters  Type of Visit Initial  Care provided to: Patient  Conversation partners present during encounter Physician  Referral source Chaplain assessment;Physician  OnCall Visit No  Spiritual Framework  Presenting Themes Meaning/purpose/sources of inspiration;Goals in life/care;Significant life change;Coping tools  Patient Stress Factors Lack of knowledge;Loss;Loss of control   Chaplain met with patient and provided emotional and spiritual support at this time as she prepares to be discharged.

## 2023-09-14 NOTE — Discharge Summary (Signed)
Physician Discharge Summary   Patient: [name] MRN: [id number] DOB: November 20, 1955  Admit date:     09/08/2023  Discharge date: 09/14/23  Discharge Physician: Thad Ranger, MD    PCP: [name], MD   Recommendations at discharge:   Continue Zithromax 500 mg daily x 2 days  Continue Keflex 500 mg twice daily for 2 days Follow CBC, bmet in 1 week Recommend palliative care to follow at the SNF, DNR  Discharge Diagnoses:    Acute metabolic encephalopathy, resolved Klebsiella pneumonia UTI Community-acquired pneumonia Hyponatremia   Acute kidney injury superimposed on chronic kidney disease IV (HCC)   UTI (urinary tract infection)   Metastatic colon cancer to liver (HCC)   Chronic blood loss anemia    HTN (hypertension)   HLD (hyperlipidemia)   Colon cancer metastasized to lung [name])     Hospital Course:  Patient is a 67 year old female with metastatic colon cancer to liver, who has not initiated hospice care yet, chronic blood loss anemia requiring transfusions, prior CVA, left nephrectomy secondary to renal cell CA presented with generalized weakness, lethargy.  Patient was [facility] to [name] on 10[facility]17, had returned home 2 days prior.  Per family since she had returned home she had been mostly sleeping in bed and barely wakes up to have conversation.  At baseline was able to ambulate with a walker.  Family also reported coughing, no labored respiration, abdominal distention, no abdominal pain. In ED, afebrile, HR 116, BP 102/63, on room air Mildly elevated creatinine 1.9 from prior 1.72, alkaline phosphatase 605, chronic. Leukocytosis 20 5K, hemoglobin 7.6 last transfusion was on 10/10  Assessment and Plan:  Acute metabolic encephalopathy -Patient was noted to be more lethargic, bedbound in the past 2 days at home, prior to admission -Chest x-ray showed low lung volumes with right basilar worsening airspace opacity concerning for pneumonia. -  Leukocytosis 25.1 on admission, improved to 11.9 at discharge -Urine culture showed Klebsiella, patient was placed on IV Rocephin  -Continue IV Rocephin, Zithromax -Ammonia level 56-> received lactulose enema -Patient is now alert and oriented, appears at her baseline mental status    Klebsiella pneumonia UTI -Urine culture showed Klebsiella pneumonia, sensitive to ceftriaxone,  -Patient received IV Rocephin while inpatient, continue Keflex 500 mg twice daily for 2 more days     Right basilar pneumonia -Continue IV Rocephin, Zithromax       Acute kidney injury superimposed on chronic kidney disease (HCC) stage IV -History of left nephrectomy from renal cell carcinoma -Elevated creatinine of 1.92 on admission from prior of 1.7 -Creatinine 1.2 at discharge   Hyponatremia -Sodium trending down, 135 on 11/6 -> 123 on 11/9 -Serum osmolarity 267, urine osmolality 116, urine sodium 16.  TSH 3.9 -Received IV fluid hydration, sodium improved to 130  -Continue salt tabs 1g TID x 7 days, recheck BMET in 1 week     Metastatic colon cancer to liver [name]) -Follows Dr. Mosetta Putt, last seen on 08/06/2023.  Per office note, patient has repeatedly declined systemic therapy, continue supportive care and transition to hospice when she is ready -Evaluated by palliative medicine, DNR.  Per Dr. Patterson Hammersmith, patient wants to continue receiving blood transfusions outpatient and returning to the hospital as needed as she feels these interventions will allow her quality time with her.  She is not seeking cancer directed therapies.     Chronic blood loss anemia -Hemoglobin 7.6 on admission, was 7.9 on 08/18/2023, receives outpatient transfusions - received 1 unit packed RBC  on 11/5. Patient has history of chronic blood loss anemia     HTN (hypertension) -BP currently stable     HLD (hyperlipidemia) -on statin        Pain control - [place] Controlled Substance Reporting System database was reviewed. and  patient was instructed, not to drive, operate heavy machinery, perform activities at heights, swimming or participation in water activities or provide baby-sitting services while on Pain, Sleep and Anxiety Medications; until their outpatient Physician has advised to do so again. Also recommended to not to take more than prescribed Pain, Sleep and Anxiety Medications.  Consultants: Palliative medicine Procedures performed: None Disposition: Skilled nursing facility Diet recommendation:  Discharge Diet Orders (From admission, onward)     Start     Ordered   09/14/23 0000  Diet general        09/14/23 1043           DISCHARGE MEDICATION: Allergies as of 09/14/2023   No Known Allergies      Medication List     STOP taking these medications    gabapentin 300 MG capsule Commonly known as: NEURONTIN   oxyCODONE 5 MG immediate release tablet Commonly known as: Oxy IR/ROXICODONE   oxyCODONE-acetaminophen 5-325 MG tablet Commonly known as: PERCOCET/ROXICET       TAKE these medications    acetaminophen 325 MG tablet Commonly known as: TYLENOL Take 1-2 tablets (325-650 mg total) by mouth every 6 (six) hours as needed for moderate pain (pain score 4-6), mild pain (pain score 1-3) or fever. What changed:  when to take this reasons to take this   allopurinol 300 MG tablet Commonly known as: ZYLOPRIM Take 300 mg by mouth daily.   amLODipine 5 MG tablet Commonly known as: NORVASC Take 1 tablet (5 mg total) by mouth daily.   azithromycin 500 MG tablet Commonly known as: ZITHROMAX Take 1 tablet (500 mg total) by mouth daily for 2 days.   cephALEXin 500 MG capsule Commonly known as: KEFLEX Take 1 capsule (500 mg total) by mouth 2 (two) times daily for 2 days.   guaiFENesin-dextromethorphan 100-10 MG/5ML syrup Commonly known as: ROBITUSSIN DM Take 5 mLs by mouth every 4 (four) hours as needed for cough (chest congestion).   lovastatin 20 MG tablet Commonly known as:  MEVACOR Take 20 mg by mouth daily.   metoprolol tartrate 25 MG tablet Commonly known as: LOPRESSOR Take 25 mg by mouth 2 (two) times daily.   MULTIVITAMIN-MINERALS PO Take 1 tablet by mouth daily.   polyethylene glycol 17 g packet Commonly known as: MIRALAX / GLYCOLAX Take 17 g by mouth daily. Start taking on: September 15, 2023   sodium chloride 1 g tablet Take 1 tablet (1 g total) by mouth 3 (three) times daily with meals for 7 days.   traZODone 50 MG tablet Commonly known as: DESYREL Take 1 tablet (50 mg total) by mouth at bedtime as needed for sleep. What changed:  when to take this reasons to take this   Vitamin D (Ergocalciferol) 1.25 MG (50000 UNIT) Caps capsule Commonly known as: DRISDOL Take 50,000 Units by mouth once a week.        Contact information for after-discharge care     Destination     [facility] Preferred SNF .   Service: Skilled Paramedic information: [address] [place] [address] [phone]                    Discharge  Exam: Filed Weights   09/08/23 1853  Weight: 82.6 kg   S: Sitting up in the chair, eating breakfast, without any difficulty, looking forward to discharge to SNF and start rehab.  Alert and oriented x 3, no acute issues overnight.  No bleeding.  BP 138/74 (BP Location: Left Arm)   Pulse 69   Temp (!) 97.5 F (36.4 C) (Oral)   Resp 18   Ht 5\' 1"  (1.549 m)   Wt 82.6 kg   SpO2 99%   BMI 34.39 kg/m   Physical Exam General: Alert and oriented x 3, NAD Cardiovascular: S1 S2 clear, RRR.  Respiratory: CTAB, no wheezing, rales or rhonchi Gastrointestinal: Soft, nontender, nondistended, NBS Ext: no pedal edema bilaterally Neuro: no new deficits Psych: Normal affect    Condition at discharge: fair  The results of significant diagnostics from this hospitalization (including imaging, microbiology, ancillary and laboratory) are listed below for  reference.   Imaging Studies: DG CHEST PORT 1 VIEW  Result Date: 09/08/2023 CLINICAL DATA:  Cough, weakness EXAM: PORTABLE CHEST 1 VIEW COMPARISON:  06/04/2023 FINDINGS: Cardiomegaly, vascular congestion. Low lung volumes. Right basilar airspace opacity, increasing since prior study. No confluent opacity on the left. No effusions or acute bony abnormality. IMPRESSION: Cardiomegaly, vascular congestion. Low lung volumes with right basilar worsening airspace opacity concerning for pneumonia. Electronically Signed   By: Charlett Nose M.D.   On: 09/08/2023 23:00    Microbiology: Results for orders placed or performed during the hospital encounter of 09/08/23  Resp panel by RT-PCR (RSV, Flu A&B, Covid) Anterior Nasal Swab     Status: None   Collection Time: 09/08/23 10:33 PM   Specimen: Anterior Nasal Swab  Result Value Ref Range Status   SARS Coronavirus 2 by RT PCR NEGATIVE NEGATIVE Final    Comment: (NOTE) SARS-CoV-2 target nucleic acids are NOT DETECTED.  The SARS-CoV-2 RNA is generally detectable in upper respiratory specimens during the acute phase of infection. The lowest concentration of SARS-CoV-2 viral copies this assay can detect is 138 copies/mL. A negative result does not preclude SARS-Cov-2 infection and should not be used as the sole basis for treatment or other patient management decisions. A negative result may occur with  improper specimen collection/handling, submission of specimen other than nasopharyngeal swab, presence of viral mutation(s) within the areas targeted by this assay, and inadequate number of viral copies(<138 copies/mL). A negative result must be combined with clinical observations, patient history, and epidemiological information. The expected result is Negative.  Fact Sheet for Patients:  BloggerCourse.com  Fact Sheet for Healthcare Providers:  SeriousBroker.it  This test is no t yet approved or  cleared by the [place] FDA and  has been authorized for detection and/or diagnosis of SARS-CoV-2 by FDA under an Emergency Use Authorization (EUA). This EUA will remain  in effect (meaning this test can be used) for the duration of the COVID-19 declaration under Section 564(b)(1) of the Act, 21 U.S.C.section 360bbb-3(b)(1), unless the authorization is terminated  or revoked sooner.       Influenza A by PCR NEGATIVE NEGATIVE Final   Influenza B by PCR NEGATIVE NEGATIVE Final    Comment: (NOTE) The Xpert Xpress SARS-CoV-2/FLU/RSV plus assay is intended as an aid in the diagnosis of influenza from Nasopharyngeal swab specimens and should not be used as a sole basis for treatment. Nasal washings and aspirates are unacceptable for Xpert Xpress SARS-CoV-2/FLU/RSV testing.  Fact Sheet for Patients: BloggerCourse.com  Fact Sheet for Healthcare Providers: SeriousBroker.it  This  test is not yet approved or cleared by the [place] and has been authorized for detection and/or diagnosis of SARS-CoV-2 by FDA under an Emergency Use Authorization (EUA). This EUA will remain in effect (meaning this test can be used) for the duration of the COVID-19 declaration under Section 564(b)(1) of the Act, 21 U.S.C. section 360bbb-3(b)(1), unless the authorization is terminated or revoked.     Resp Syncytial Virus by PCR NEGATIVE NEGATIVE Final    Comment: (NOTE) Fact Sheet for Patients: BloggerCourse.com  Fact Sheet for Healthcare Providers: SeriousBroker.it  This test is not yet approved or cleared by the [place] FDA and has been authorized for detection and/or diagnosis of SARS-CoV-2 by FDA under an Emergency Use Authorization (EUA). This EUA will remain in effect (meaning this test can be used) for the duration of the COVID-19 declaration under Section 564(b)(1) of the Act, 21  U.S.C. section 360bbb-3(b)(1), unless the authorization is terminated or revoked.  Performed at [facility], [address], [place], [place] [address]   Culture, blood (Routine X 2) w Reflex to ID Panel     Status: None   Collection Time: 09/09/23  4:42 AM   Specimen: BLOOD RIGHT HAND  Result Value Ref Range Status   Specimen Description   Final    BLOOD RIGHT HAND Performed at [facility], [address], [place], [place] [address]    Special Requests   Final    BOTTLES DRAWN AEROBIC AND ANAEROBIC Blood Culture adequate volume Performed at [facility], [address] [place], [place] [address]    Culture   Final    NO GROWTH 5 DAYS Performed at [facility], [address] [place], [place] [address]    Report Status 09/14/2023 FINAL  Final  Culture, blood (Routine X 2) w Reflex to ID Panel     Status: None   Collection Time: 09/09/23  4:42 AM   Specimen: BLOOD LEFT HAND  Result Value Ref Range Status   Specimen Description   Final    BLOOD LEFT HAND Performed at [facility], [address], [place], [place] [address]    Special Requests   Final    BOTTLES DRAWN AEROBIC AND ANAEROBIC Blood Culture adequate volume Performed at [facility], [address] [place], [place] [address]    Culture   Final    NO GROWTH 5 DAYS Performed at [facility], [address], [place], [place] [address]    Report Status 09/14/2023 FINAL  Final  Urine Culture     Status: Abnormal   Collection Time: 09/09/23 11:00 AM   Specimen: Urine, Random  Result Value Ref Range Status   Specimen Description   Final    URINE, RANDOM Performed at [facility], [address], [place], [place] [address]    Special Requests   Final    NONE Performed at [facility], [address], [place], [place] [address]    Culture >=100,000 COLONIES/mL  KLEBSIELLA PNEUMONIAE (A)  Final   Report Status 09/11/2023 FINAL  Final   Organism ID, Bacteria KLEBSIELLA PNEUMONIAE (A)  Final      Susceptibility   Klebsiella pneumoniae - MIC*    AMPICILLIN RESISTANT Resistant     CEFAZOLIN <=4 SENSITIVE Sensitive     CEFEPIME <=0.12 SENSITIVE Sensitive     CEFTRIAXONE <=0.25 SENSITIVE Sensitive     CIPROFLOXACIN <=0.25 SENSITIVE Sensitive  GENTAMICIN <=1 SENSITIVE Sensitive     IMIPENEM <=0.25 SENSITIVE Sensitive     NITROFURANTOIN 128 RESISTANT Resistant     TRIMETH/SULFA <=20 SENSITIVE Sensitive     AMPICILLIN/SULBACTAM 4 SENSITIVE Sensitive     PIP/TAZO <=4 SENSITIVE Sensitive ug/mL    * >=100,000 COLONIES/mL KLEBSIELLA PNEUMONIAE    Labs: CBC: Recent Labs  Lab 09/08/23 2233 09/09/23 0442 09/10/23 0432 09/11/23 0448 09/12/23 0524 09/13/23 0455 09/14/23 0439  WBC 21.9*   < > 15.8* 14.1* 14.0* 13.4* 11.9*  NEUTROABS 18.6*  --   --   --   --   --   --   HGB 7.1*   < > 8.3* 8.8* 8.3* 8.2* 7.5*  HCT 24.1*   < > 27.5* 29.4* 26.7* 27.0* 25.0*  MCV 99.6   < > 96.2 97.0 93.0 95.1 98.0  PLT 333   < > 291 285 286 260 235   < > = values in this interval not displayed.   Basic Metabolic Panel: Recent Labs  Lab 09/10/23 0432 09/11/23 0448 09/12/23 0524 09/13/23 0455 09/14/23 0439  NA 133* 130* 123* 129* 130*  K 4.0 4.0 4.2 4.1 4.2  CL 99 98 91* 99 99  CO2 21* 20* 19* 19* 19*  GLUCOSE 85 80 66* 84 78  BUN 21 19 19 20 17   CREATININE 1.49* 1.37* 1.44* 1.21* 1.27*  CALCIUM 8.1* 8.0* 8.0* 7.8* 7.9*   Liver Function Tests: Recent Labs  Lab 09/08/23 1912 09/09/23 0442  AST 55* 48*  ALT 29 28  ALKPHOS 605* 543*  BILITOT 3.0* 3.1*  PROT 6.3* 6.0*  ALBUMIN 1.9* 1.8*   CBG: No results for input(s): "GLUCAP" in the last 168 hours.  Discharge time spent: greater than 30 minutes.  Signed: Thad Ranger, MD [facility] 09/14/2023

## 2023-09-14 NOTE — TOC Progression Note (Addendum)
Transition of Care [facility]) - Progression Note    Patient Details  Name: [name] MRN: [id number] Date of Birth: 07/16/1956  [facility] CM/SW Contact  Adrian Prows, RN Phone Number: 09/14/2023, 9:37 AM  Clinical Narrative:    Notified ins auth received; Plan Auth ID [id number], Auth ID [id number]; start date 09/14/23, end date 09/16/23; per Dr Isidoro Donning pt is ready for d/c; awaiting orders and d/c summary.  [phone] spoke w/ Bjorn Loser at [place]; she gave RM # [phone], and call report # [phone]; Bjorn Loser also requests pt arrive this afternoon.  Expected Discharge Plan: Skilled Nursing Facility Barriers to Discharge: English as a second language teacher, SNF Pending bed offer  Expected Discharge Plan and Services In-house Referral: Clinical Social Work   Post Acute Care Choice: Skilled Nursing Facility Living arrangements for the past 2 months: Single Family Home                                       Social Determinants of Health (SDOH) Interventions SDOH Screenings   Food Insecurity: No Food Insecurity (09/09/2023)  Housing: Low Risk  (09/09/2023)  Transportation Needs: No Transportation Needs (09/09/2023)  Utilities: Not At Risk (09/09/2023)  Tobacco Use: Low Risk  (09/09/2023)    Readmission Risk Interventions    09/10/2023    3:11 PM  Readmission Risk Prevention Plan  Transportation Screening Complete  PCP or Specialist Appt within 5-7 Days Complete  Home Care Screening Complete  Medication Review (RN CM) Complete

## 2023-09-16 ENCOUNTER — Telehealth: Payer: Self-pay | Admitting: Hematology
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
## 2023-09-23 ENCOUNTER — Encounter: Payer: Self-pay | Admitting: Hematology

## 2023-09-23 ENCOUNTER — Other Ambulatory Visit: Payer: Self-pay

## 2023-09-23 ENCOUNTER — Inpatient Hospital Stay (HOSPITAL_BASED_OUTPATIENT_CLINIC_OR_DEPARTMENT_OTHER): Payer: 59 | Admitting: Hematology

## 2023-09-23 ENCOUNTER — Inpatient Hospital Stay: Payer: 59

## 2023-09-23 ENCOUNTER — Telehealth: Payer: Self-pay

## 2023-09-23 ENCOUNTER — Inpatient Hospital Stay: Payer: 59 | Attending: Hematology

## 2023-09-23 VITALS — BP 96/66 | HR 88 | Temp 97.9°F | Resp 16

## 2023-09-23 DIAGNOSIS — C189 Malignant neoplasm of colon, unspecified: Secondary | ICD-10-CM

## 2023-09-23 DIAGNOSIS — C787 Secondary malignant neoplasm of liver and intrahepatic bile duct: Secondary | ICD-10-CM | POA: Insufficient documentation

## 2023-09-23 DIAGNOSIS — D500 Iron deficiency anemia secondary to blood loss (chronic): Secondary | ICD-10-CM

## 2023-09-23 DIAGNOSIS — Z79899 Other long term (current) drug therapy: Secondary | ICD-10-CM | POA: Insufficient documentation

## 2023-09-23 DIAGNOSIS — D649 Anemia, unspecified: Secondary | ICD-10-CM

## 2023-09-23 DIAGNOSIS — C187 Malignant neoplasm of sigmoid colon: Secondary | ICD-10-CM | POA: Diagnosis present

## 2023-09-23 LAB — CBC WITH DIFFERENTIAL (CANCER CENTER ONLY)
Abs Immature Granulocytes: 0.11 10*3/uL — ABNORMAL HIGH (ref 0.00–0.07)
Basophils Absolute: 0.1 10*3/uL (ref 0.0–0.1)
Basophils Relative: 0 %
Eosinophils Absolute: 0.2 10*3/uL (ref 0.0–0.5)
Eosinophils Relative: 1 %
HCT: 25.1 % — ABNORMAL LOW (ref 36.0–46.0)
Hemoglobin: 7.8 g/dL — ABNORMAL LOW (ref 12.0–15.0)
Immature Granulocytes: 1 %
Lymphocytes Relative: 10 %
Lymphs Abs: 1.7 10*3/uL (ref 0.7–4.0)
MCH: 30.8 pg (ref 26.0–34.0)
MCHC: 31.1 g/dL (ref 30.0–36.0)
MCV: 99.2 fL (ref 80.0–100.0)
Monocytes Absolute: 0.7 10*3/uL (ref 0.1–1.0)
Monocytes Relative: 5 %
Neutro Abs: 13.5 10*3/uL — ABNORMAL HIGH (ref 1.7–7.7)
Neutrophils Relative %: 83 %
Platelet Count: 309 10*3/uL (ref 150–400)
RBC: 2.53 MIL/uL — ABNORMAL LOW (ref 3.87–5.11)
RDW: 22.9 % — ABNORMAL HIGH (ref 11.5–15.5)
WBC Count: 16.2 10*3/uL — ABNORMAL HIGH (ref 4.0–10.5)
nRBC: 0.2 % (ref 0.0–0.2)

## 2023-09-23 LAB — CMP (CANCER CENTER ONLY)
ALT: 42 U/L (ref 0–44)
AST: 79 U/L — ABNORMAL HIGH (ref 15–41)
Albumin: 2.6 g/dL — ABNORMAL LOW (ref 3.5–5.0)
Alkaline Phosphatase: 1141 U/L — ABNORMAL HIGH (ref 38–126)
Anion gap: 10 (ref 5–15)
BUN: 16 mg/dL (ref 8–23)
CO2: 25 mmol/L (ref 22–32)
Calcium: 8.7 mg/dL — ABNORMAL LOW (ref 8.9–10.3)
Chloride: 96 mmol/L — ABNORMAL LOW (ref 98–111)
Creatinine: 1.33 mg/dL — ABNORMAL HIGH (ref 0.44–1.00)
GFR, Estimated: 43 mL/min — ABNORMAL LOW (ref >60)
Glucose, Bld: 94 mg/dL (ref 70–99)
Potassium: 4.5 mmol/L (ref 3.5–5.1)
Sodium: 131 mmol/L — ABNORMAL LOW (ref 135–145)
Total Bilirubin: 5.2 mg/dL (ref <1.2)
Total Protein: 6.3 g/dL — ABNORMAL LOW (ref 6.5–8.1)

## 2023-09-23 LAB — FERRITIN: Ferritin: 198 ng/mL (ref 11–307)

## 2023-09-23 LAB — PREPARE RBC (CROSSMATCH)

## 2023-09-23 LAB — SAMPLE TO BLOOD BANK

## 2023-09-23 LAB — CEA (IN HOUSE-CHCC): CEA (CHCC-In House): 1849.74 ng/mL — ABNORMAL HIGH (ref 0.00–5.00)

## 2023-09-23 MED ORDER — SODIUM CHLORIDE 0.9% IV SOLUTION
250.0000 mL | INTRAVENOUS | Status: DC
  Administered 2023-09-23: 100 mL via INTRAVENOUS
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
## 2023-09-23 NOTE — Telephone Encounter (Signed)
Critical Lab Value reported:  Tbili 5.2 and hgb 7.8  Dr. Mosetta Putt is aware of critical.  Pt will get 1 unit of PRBCs today.

## 2023-09-23 NOTE — Patient Instructions (Signed)
Blood Transfusion, Adult A blood transfusion is a procedure in which you receive blood or a type of blood cell (blood component) through an IV. You may need a blood transfusion when you have a low blood count, which is a low number of any blood cell. This may result from a bleeding disorder, illness, injury, or surgery. The blood may come from a donor, or you may be able to have your own blood collected and stored (autologous blood donation) before a planned surgery. The blood given in a transfusion may be made up of different blood components. You may receive: Red blood cells. These carry oxygen to the cells in the body. Platelets. These help your blood to clot. Plasma. This is the liquid part of your blood. It carries proteins and other substances throughout the body. White blood cells. These help you fight infections. If you have hemophilia or another clotting disorder, you may also receive other types of blood products. Depending on the type of blood product, this procedure may take 1-4 hours to complete. Tell a health care provider about: Any bleeding problems you have. Any previous reactions you have had during a blood transfusion. Any allergies you have. All medicines you are taking, including vitamins, herbs, eye drops, creams, and over-the-counter medicines. Any surgeries you have had. Any medical conditions you have. Whether you are pregnant or may be pregnant. What are the risks? Talk with your health care provider about risks. The most common problems include: A mild allergic reaction, such as red, swollen areas of skin (hives) and itching. Fever or chills. This may be the body's response to new blood cells received. This may occur during or up to 4 hours after the transfusion. More serious problems may include: A serious allergic reaction that causes difficulty breathing or swelling around the face and lips. Transfusion-associated circulatory overload (TACO), or too much fluid in  the lungs. This may cause breathing problems. Transfusion-related acute lung injury (TRALI), which causes breathing difficulty and low oxygen in the blood. This can occur within hours of the transfusion or several days later. Iron overload. This can happen after receiving many blood transfusions over a period of time. Infection or virus being transmitted. This is rare because donated blood is carefully tested before it is given. Hemolytic transfusion reaction. This is rare. It happens when the body's defense system (immune system)tries to attack the new blood cells. Symptoms may include fever, chills, nausea, low blood pressure, and low back or chest pain. Transfusion-associated graft-versus-host disease (TAGVHD). This is rare. It happens when donated cells attack the body's healthy tissues. What happens before the procedure? You will have a blood test to check your blood type. This test is done to know what kind of blood your body will accept and to match it to the donor blood. If you are going to have a planned surgery, you may be able to do an autologous blood donation. This may be done in case you need to have a transfusion. You will have your temperature, blood pressure, and pulse checked before the transfusion. If you have had an allergic reaction to a transfusion in the past, you may be given medicine to help prevent a reaction. This medicine may be given to you by mouth (orally) or through an IV. What happens during the procedure?  An IV will be inserted into one of your veins. The bag of blood will be attached to your IV. The blood will then enter through your vein. Your temperature, blood pressure,   and pulse will be monitored during the transfusion. This monitoring is done to detect early signs of a transfusion reaction. Tell your nurse right away if you have any of these symptoms during the transfusion: Shortness of breath or trouble breathing. Chest or back pain. Fever or  chills. Itching or hives. If you have any signs or symptoms of a reaction, your transfusion will be stopped and you may be given medicine. When the transfusion is complete, your IV will be removed. Pressure may be applied to the IV site for a few minutes. A bandage (dressing)will be applied. The procedure may vary among health care providers and hospitals. What happens after the procedure? Your temperature, blood pressure, pulse, breathing rate, and blood oxygen level will be monitored until you leave the hospital or clinic. Your blood may be tested to see how you have responded to the transfusion. You may be warmed with fluids or blankets to maintain a normal body temperature. If you receive your blood transfusion in an outpatient setting, you will be told whom to contact to report any reactions. Where to find more information Visit the [employer]: redcross.org Summary A blood transfusion is a procedure in which you receive blood or a type of blood cell (blood component) through an IV. The blood given in a transfusion may be made up of different blood components. You may receive red blood cells, platelets, plasma, or white blood cells depending on the condition treated. Your temperature, blood pressure, and pulse will be monitored before, during, and after the transfusion. After the transfusion, your blood may be tested to see how your body has responded. This information is not intended to replace advice given to you by your health care provider. Make sure you discuss any questions you have with your health care provider. Document Revised: 01/17/2022 Document Reviewed: 01/17/2022 Elsevier Patient Education  2024 [employer]  

## 2023-09-24 LAB — TYPE AND SCREEN
ABO/RH(D): A POS
Antibody Screen: NEGATIVE
Unit division: 0

## 2023-09-24 LAB — BPAM RBC
Blood Product Expiration Date: 202411272359
ISSUE DATE / TIME: 202411201147
Unit Type and Rh: 6200

## 2023-10-05 ENCOUNTER — Other Ambulatory Visit: Payer: 59

## 2023-10-05 ENCOUNTER — Ambulatory Visit: Payer: 59 | Admitting: Hematology

## 2023-10-22 LAB — MOLECULAR PATHOLOGY

## 2023-11-04 DEATH — deceased

## 2024-07-19 ENCOUNTER — Encounter: Payer: Self-pay | Admitting: Hematology
